# Patient Record
Sex: Female | Born: 1950 | Race: White | Hispanic: No | Marital: Married | Smoking: Former smoker
Health system: Southern US, Community
[De-identification: ages and names within clinical notes are randomized; demographics above are authoritative.]

## PROBLEM LIST (undated history)

## (undated) DIAGNOSIS — M797 Fibromyalgia: Secondary | ICD-10-CM

## (undated) DIAGNOSIS — K6289 Other specified diseases of anus and rectum: Secondary | ICD-10-CM

## (undated) DIAGNOSIS — E785 Hyperlipidemia, unspecified: Secondary | ICD-10-CM

## (undated) DIAGNOSIS — E739 Lactose intolerance, unspecified: Secondary | ICD-10-CM

## (undated) DIAGNOSIS — K900 Celiac disease: Secondary | ICD-10-CM

## (undated) DIAGNOSIS — M4800 Spinal stenosis, site unspecified: Secondary | ICD-10-CM

## (undated) DIAGNOSIS — IMO0002 Reserved for concepts with insufficient information to code with codable children: Secondary | ICD-10-CM

## (undated) DIAGNOSIS — M1990 Unspecified osteoarthritis, unspecified site: Secondary | ICD-10-CM

## (undated) DIAGNOSIS — I10 Essential (primary) hypertension: Secondary | ICD-10-CM

## (undated) DIAGNOSIS — K5190 Ulcerative colitis, unspecified, without complications: Secondary | ICD-10-CM

## (undated) DIAGNOSIS — D6851 Activated protein C resistance: Secondary | ICD-10-CM

## (undated) DIAGNOSIS — K219 Gastro-esophageal reflux disease without esophagitis: Secondary | ICD-10-CM

## (undated) DIAGNOSIS — M419 Scoliosis, unspecified: Secondary | ICD-10-CM

## (undated) DIAGNOSIS — E559 Vitamin D deficiency, unspecified: Secondary | ICD-10-CM

## (undated) DIAGNOSIS — Z981 Arthrodesis status: Secondary | ICD-10-CM

## (undated) DIAGNOSIS — K5792 Diverticulitis of intestine, part unspecified, without perforation or abscess without bleeding: Secondary | ICD-10-CM

## (undated) DIAGNOSIS — M4326 Fusion of spine, lumbar region: Secondary | ICD-10-CM

## (undated) DIAGNOSIS — S060XAA Concussion with loss of consciousness status unknown, initial encounter: Secondary | ICD-10-CM

## (undated) DIAGNOSIS — S060X9A Concussion with loss of consciousness of unspecified duration, initial encounter: Secondary | ICD-10-CM

## (undated) HISTORY — DX: Hyperlipidemia, unspecified: E78.5

## (undated) HISTORY — DX: Scoliosis, unspecified: M41.9

## (undated) HISTORY — DX: Reserved for concepts with insufficient information to code with codable children: IMO0002

## (undated) HISTORY — DX: Vitamin D deficiency, unspecified: E55.9

## (undated) HISTORY — PX: EYE SURGERY: SHX253

## (undated) HISTORY — DX: Spinal stenosis, site unspecified: M48.00

## (undated) HISTORY — PX: LASIK: SHX215

## (undated) HISTORY — PX: BACK SURGERY: SHX140

## (undated) HISTORY — DX: Unspecified osteoarthritis, unspecified site: M19.90

---

## 1976-07-27 HISTORY — PX: FOOT SURGERY: SHX648

## 1981-07-27 HISTORY — PX: TUBAL LIGATION: SHX77

## 1998-01-10 ENCOUNTER — Ambulatory Visit (HOSPITAL_COMMUNITY): Admission: RE | Admit: 1998-01-10 | Discharge: 1998-01-10 | Payer: Self-pay | Admitting: Gastroenterology

## 1998-02-28 ENCOUNTER — Ambulatory Visit (HOSPITAL_COMMUNITY): Admission: RE | Admit: 1998-02-28 | Discharge: 1998-02-28 | Payer: Self-pay | Admitting: Gastroenterology

## 1998-05-06 ENCOUNTER — Ambulatory Visit (HOSPITAL_COMMUNITY): Admission: RE | Admit: 1998-05-06 | Discharge: 1998-05-06 | Payer: Self-pay | Admitting: Internal Medicine

## 1998-06-10 ENCOUNTER — Other Ambulatory Visit: Admission: RE | Admit: 1998-06-10 | Discharge: 1998-06-10 | Payer: Self-pay | Admitting: Gynecology

## 1999-03-08 ENCOUNTER — Encounter: Payer: Self-pay | Admitting: Emergency Medicine

## 1999-03-08 ENCOUNTER — Emergency Department (HOSPITAL_COMMUNITY): Admission: EM | Admit: 1999-03-08 | Discharge: 1999-03-08 | Payer: Self-pay | Admitting: Emergency Medicine

## 1999-05-19 ENCOUNTER — Encounter: Payer: Self-pay | Admitting: Internal Medicine

## 1999-05-19 ENCOUNTER — Ambulatory Visit (HOSPITAL_COMMUNITY): Admission: RE | Admit: 1999-05-19 | Discharge: 1999-05-19 | Payer: Self-pay | Admitting: Internal Medicine

## 1999-06-11 ENCOUNTER — Other Ambulatory Visit: Admission: RE | Admit: 1999-06-11 | Discharge: 1999-06-11 | Payer: Self-pay | Admitting: Gynecology

## 2000-05-20 ENCOUNTER — Encounter: Payer: Self-pay | Admitting: Gynecology

## 2000-05-20 ENCOUNTER — Ambulatory Visit (HOSPITAL_COMMUNITY): Admission: RE | Admit: 2000-05-20 | Discharge: 2000-05-20 | Payer: Self-pay | Admitting: Gynecology

## 2000-06-10 ENCOUNTER — Other Ambulatory Visit: Admission: RE | Admit: 2000-06-10 | Discharge: 2000-06-10 | Payer: Self-pay | Admitting: Gynecology

## 2000-07-27 HISTORY — PX: NECK SURGERY: SHX720

## 2000-10-11 ENCOUNTER — Other Ambulatory Visit: Admission: RE | Admit: 2000-10-11 | Discharge: 2000-10-11 | Payer: Self-pay | Admitting: Gynecology

## 2001-05-24 ENCOUNTER — Ambulatory Visit (HOSPITAL_COMMUNITY): Admission: RE | Admit: 2001-05-24 | Discharge: 2001-05-24 | Payer: Self-pay | Admitting: Gynecology

## 2001-05-24 ENCOUNTER — Encounter: Payer: Self-pay | Admitting: Gynecology

## 2001-06-16 ENCOUNTER — Other Ambulatory Visit: Admission: RE | Admit: 2001-06-16 | Discharge: 2001-06-16 | Payer: Self-pay | Admitting: Gynecology

## 2002-05-26 ENCOUNTER — Encounter: Payer: Self-pay | Admitting: Internal Medicine

## 2002-05-26 ENCOUNTER — Ambulatory Visit (HOSPITAL_COMMUNITY): Admission: RE | Admit: 2002-05-26 | Discharge: 2002-05-26 | Payer: Self-pay | Admitting: Internal Medicine

## 2002-06-20 ENCOUNTER — Other Ambulatory Visit: Admission: RE | Admit: 2002-06-20 | Discharge: 2002-06-20 | Payer: Self-pay | Admitting: Gynecology

## 2003-03-30 ENCOUNTER — Encounter: Payer: Self-pay | Admitting: Gynecology

## 2003-03-30 ENCOUNTER — Ambulatory Visit (HOSPITAL_COMMUNITY): Admission: RE | Admit: 2003-03-30 | Discharge: 2003-03-30 | Payer: Self-pay | Admitting: Gynecology

## 2003-06-13 ENCOUNTER — Ambulatory Visit (HOSPITAL_COMMUNITY): Admission: RE | Admit: 2003-06-13 | Discharge: 2003-06-13 | Payer: Self-pay | Admitting: Gynecology

## 2003-06-26 ENCOUNTER — Other Ambulatory Visit: Admission: RE | Admit: 2003-06-26 | Discharge: 2003-06-26 | Payer: Self-pay | Admitting: Gynecology

## 2004-05-06 ENCOUNTER — Ambulatory Visit: Payer: Self-pay | Admitting: Otolaryngology

## 2004-05-21 ENCOUNTER — Ambulatory Visit: Payer: Self-pay | Admitting: Otolaryngology

## 2004-06-13 ENCOUNTER — Ambulatory Visit (HOSPITAL_COMMUNITY): Admission: RE | Admit: 2004-06-13 | Discharge: 2004-06-13 | Payer: Self-pay | Admitting: Gynecology

## 2004-07-01 ENCOUNTER — Other Ambulatory Visit: Admission: RE | Admit: 2004-07-01 | Discharge: 2004-07-01 | Payer: Self-pay | Admitting: Gynecology

## 2004-10-10 ENCOUNTER — Ambulatory Visit: Payer: Self-pay | Admitting: Internal Medicine

## 2004-11-12 ENCOUNTER — Encounter (INDEPENDENT_AMBULATORY_CARE_PROVIDER_SITE_OTHER): Payer: Self-pay | Admitting: Specialist

## 2004-11-12 ENCOUNTER — Ambulatory Visit (HOSPITAL_COMMUNITY): Admission: RE | Admit: 2004-11-12 | Discharge: 2004-11-12 | Payer: Self-pay | Admitting: Gastroenterology

## 2004-11-28 ENCOUNTER — Encounter: Admission: RE | Admit: 2004-11-28 | Discharge: 2004-11-28 | Payer: Self-pay | Admitting: Gynecology

## 2005-01-15 ENCOUNTER — Ambulatory Visit: Payer: Self-pay | Admitting: Internal Medicine

## 2005-03-04 ENCOUNTER — Ambulatory Visit: Payer: Self-pay | Admitting: Anesthesiology

## 2005-04-09 ENCOUNTER — Ambulatory Visit: Payer: Self-pay | Admitting: Anesthesiology

## 2005-05-21 ENCOUNTER — Ambulatory Visit: Payer: Self-pay | Admitting: Anesthesiology

## 2005-06-15 ENCOUNTER — Ambulatory Visit (HOSPITAL_COMMUNITY): Admission: RE | Admit: 2005-06-15 | Discharge: 2005-06-15 | Payer: Self-pay | Admitting: Internal Medicine

## 2005-06-23 ENCOUNTER — Ambulatory Visit: Payer: Self-pay | Admitting: Anesthesiology

## 2005-07-09 ENCOUNTER — Other Ambulatory Visit: Admission: RE | Admit: 2005-07-09 | Discharge: 2005-07-09 | Payer: Self-pay | Admitting: Gynecology

## 2005-08-20 ENCOUNTER — Ambulatory Visit: Payer: Self-pay | Admitting: Anesthesiology

## 2005-08-24 ENCOUNTER — Ambulatory Visit: Payer: Self-pay | Admitting: Anesthesiology

## 2005-09-09 ENCOUNTER — Ambulatory Visit: Payer: Self-pay | Admitting: Anesthesiology

## 2006-03-03 ENCOUNTER — Ambulatory Visit: Payer: Self-pay

## 2006-04-29 ENCOUNTER — Ambulatory Visit (HOSPITAL_COMMUNITY): Admission: RE | Admit: 2006-04-29 | Discharge: 2006-04-30 | Payer: Self-pay | Admitting: Neurosurgery

## 2007-03-27 ENCOUNTER — Encounter: Admission: RE | Admit: 2007-03-27 | Discharge: 2007-03-27 | Payer: Self-pay | Admitting: Neurosurgery

## 2007-04-21 ENCOUNTER — Inpatient Hospital Stay (HOSPITAL_COMMUNITY): Admission: RE | Admit: 2007-04-21 | Discharge: 2007-04-24 | Payer: Self-pay | Admitting: Neurosurgery

## 2007-04-26 ENCOUNTER — Encounter: Admission: RE | Admit: 2007-04-26 | Discharge: 2007-04-26 | Payer: Self-pay | Admitting: Neurosurgery

## 2007-05-04 ENCOUNTER — Inpatient Hospital Stay (HOSPITAL_COMMUNITY): Admission: EM | Admit: 2007-05-04 | Discharge: 2007-05-11 | Payer: Self-pay | Admitting: Emergency Medicine

## 2007-05-04 ENCOUNTER — Ambulatory Visit: Payer: Self-pay | Admitting: Internal Medicine

## 2007-05-05 ENCOUNTER — Ambulatory Visit: Payer: Self-pay | Admitting: Internal Medicine

## 2007-05-24 ENCOUNTER — Observation Stay (HOSPITAL_COMMUNITY): Admission: EM | Admit: 2007-05-24 | Discharge: 2007-05-25 | Payer: Self-pay | Admitting: *Deleted

## 2007-06-07 ENCOUNTER — Ambulatory Visit: Payer: Self-pay | Admitting: Internal Medicine

## 2007-06-07 DIAGNOSIS — K5190 Ulcerative colitis, unspecified, without complications: Secondary | ICD-10-CM

## 2007-06-07 DIAGNOSIS — Z9889 Other specified postprocedural states: Secondary | ICD-10-CM

## 2007-06-07 DIAGNOSIS — I10 Essential (primary) hypertension: Secondary | ICD-10-CM

## 2007-06-07 DIAGNOSIS — M5136 Other intervertebral disc degeneration, lumbar region: Secondary | ICD-10-CM

## 2007-08-17 ENCOUNTER — Ambulatory Visit: Payer: Self-pay | Admitting: Internal Medicine

## 2007-08-24 ENCOUNTER — Encounter: Payer: Self-pay | Admitting: Internal Medicine

## 2007-09-01 ENCOUNTER — Encounter: Payer: Self-pay | Admitting: Internal Medicine

## 2008-09-01 IMAGING — CR DG LUMBAR SPINE 1V
1 series · 1 of 1 positions shown · non-contrast
Comparison: none

CLINICAL DATA: L2-L3, L3-L4, L4-L5, L5-S1 decompression and fusion

LUMBAR SPINE - 1 VIEW

[view not recorded]
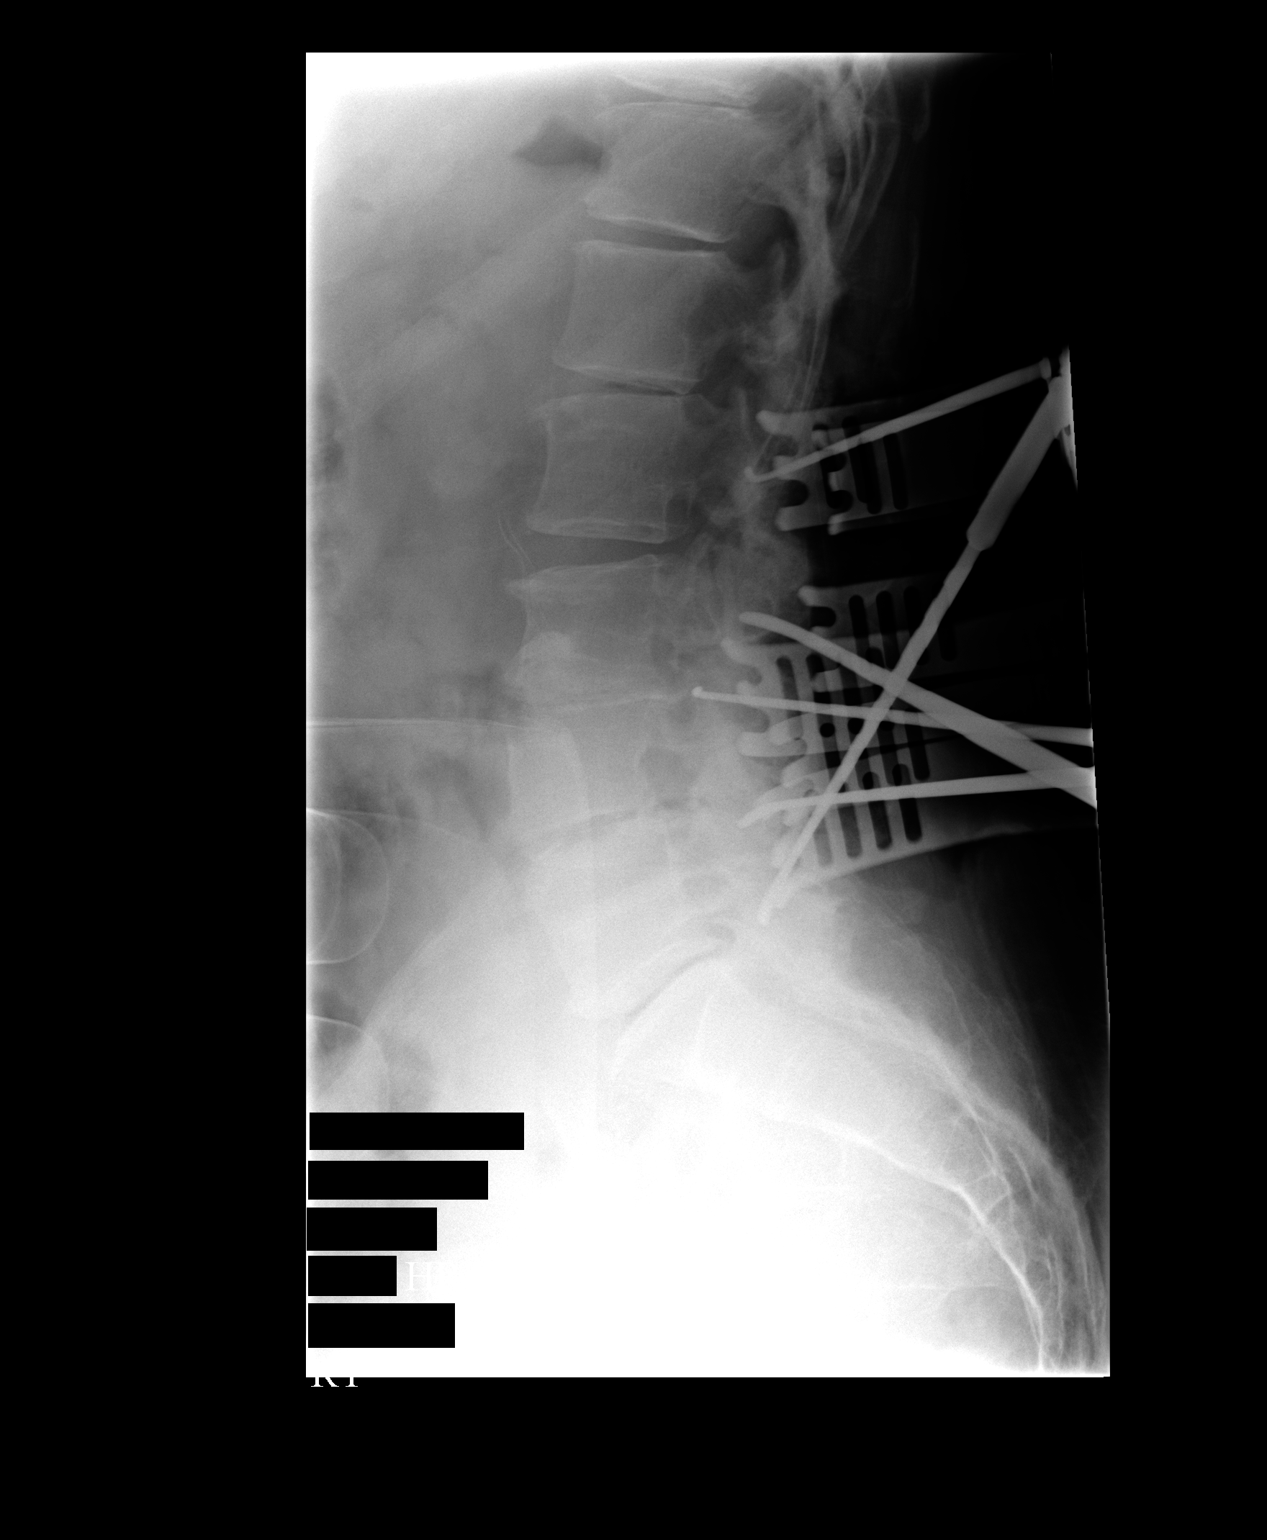

[1 of 1 positions shown; findings below may reference images not displayed]

FINDINGS: Intraoperative film shows multiple posterior surgical instruments in
place, spanning from L2 to L5-S1.

IMPRESSION

Localization as above.

## 2008-09-16 IMAGING — CR DG CHEST 1V PORT
1 series · 1 of 1 positions shown · non-contrast
Comparison: 04/27/06.

CLINICAL DATA: Wound infection.  Respiratory distress.
 PORTABLE CHEST - 1 VIEW:

[view not recorded]
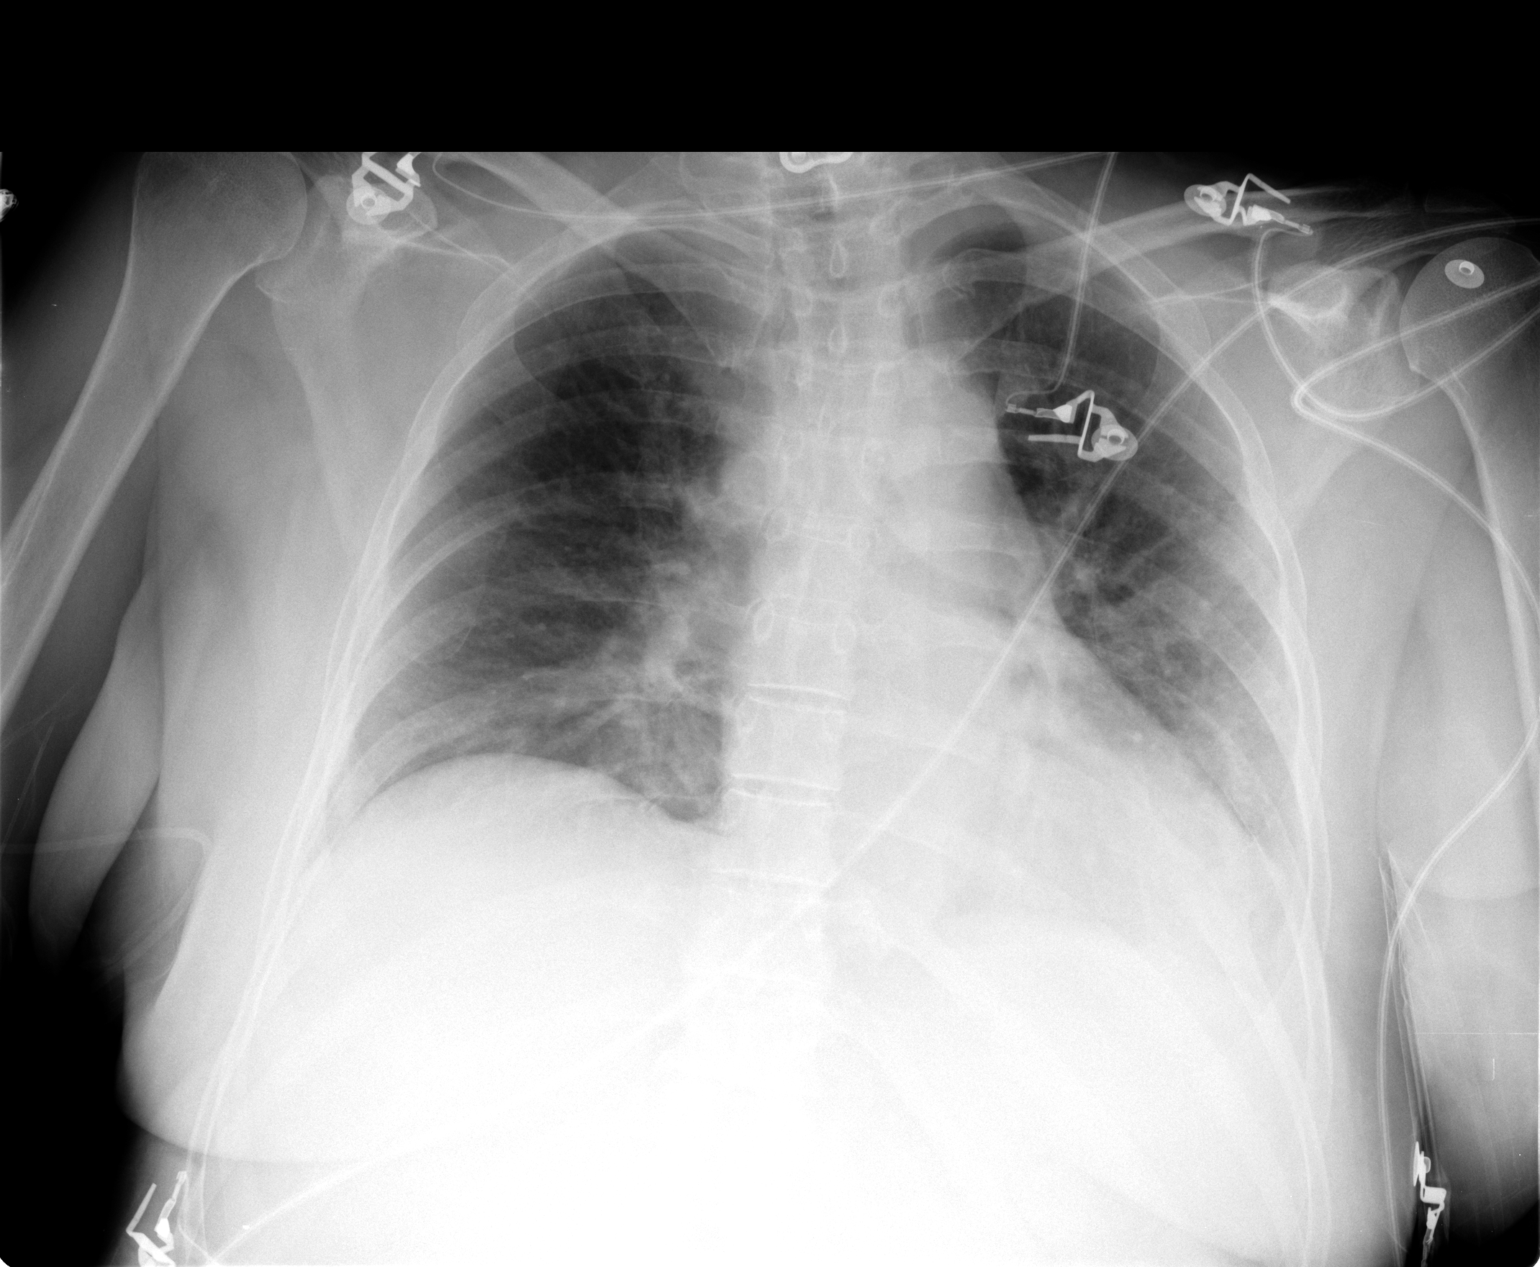

[1 of 1 positions shown; findings below may reference images not displayed]

FINDINGS: The patient has undergone interval cervical fusion.  There is a left pleural effusion and left basilar airspace disease.  Right lung is clear.  Heart size is normal.
IMPRESSION: Small left pleural effusion with left basilar airspace disease which could represent atelectasis.  Pneumonia is not excluded.

## 2010-12-09 NOTE — Discharge Summary (Signed)
NAME:  Carmen Reynolds, Carmen Reynolds                  ACCOUNT NO.:  [id number]   MEDICAL RECORD NO.:  [id number]          PATIENT TYPE:  INP   LOCATION:  3035                         FACILITY:  [facility]   PHYSICIAN:  Leeroy Cha, M.D.   DATE OF BIRTH:  [birth year]   DATE OF ADMISSION:  04/21/2007  DATE OF DISCHARGE:  04/24/2007                               DISCHARGE SUMMARY   ADMISSION DIAGNOSES:  Scoliosis, spondylolisthesis with stenosis from L2-  S1.   FINAL DIAGNOSES:  Scoliosis, spondylolisthesis with stenosis from L2-S1.   CLINICAL HISTORY:  The patient was admitted at Dr. Vertell Limber because of back  pain with radiation to both legs.  X-ray shows stenosis with scoliosis  from L2-S1.  Surgery was advised.  Laboratory normal except that after  surgery the hemoglobin came down to 7.8.  This is acute anemia secondary  to blood loss during surgery.   COURSE IN THE HOSPITAL:  The patient was taken to surgery where a fusion  from L2-S1 was done.  After surgery, the patient complains of dizziness  and low blood pressure.  Repeat CBC showed hemoglobin was 7.8.  Because  of that, we proceeded and transfused 3 units of blood.  Today she is  doing much better.  She feels that she wants to go home.  She was still  having some episodes of constipation and also hemorrhoidal pain.  She  feels that she can go home right now and be followed by Dr. Vertell Limber.   CONDITION ON DISCHARGE:  Improvement.   DISCHARGE MEDICATIONS:  Percocet, diazepam.  She is to continue taking  her previous medications.   DIET:  As preoperatively.   ACTIVITY:  Not to drive, not to do any heavy lifting.   FOLLOWUP:  She is going to call Dr. Vertell Limber for a followup appointment.           ______________________________  Leeroy Cha, M.D.     EB/MEDQ  D:  04/24/2007  T:  04/24/2007  Job:  [id number]

## 2010-12-09 NOTE — Discharge Summary (Signed)
NAMEMarland Reynolds  [name], [name]                  ACCOUNT NO.:  [id number]   MEDICAL RECORD NO.:  [id number]          PATIENT TYPE:  INP   LOCATION:  3027                         FACILITY:  [facility]   PHYSICIAN:  Elizabeth Sauer, M.D.      DATE OF BIRTH:  1950-11-06   DATE OF ADMISSION:  05/04/2007  DATE OF DISCHARGE:  05/11/2007                               DISCHARGE SUMMARY   ADMITTING DIAGNOSIS:  Infected heart effusion.   DISCHARGE DIAGNOSIS:  Infected heart effusion.   OPERATION/PROCEDURE:  1. Incision and drainage of infection.  2. Placement of VAC drain.   HISTORY:  This is a 60 year old lady who had severe  scoliosis/spondylosis, underwent an operation April 21, 2007.  Went  home and did well.  Has had increasing redness and drainage from her  incision.  She was admitted by Dr. Vertell Limber on May 04, 2007 and placed  her on antibiotics.  Over the course of the day after her admission, she  had increasing redness, became hypotensive, and appeared septic.  She  was taken urgently to the OR on May 05, 2007 where she underwent I  and D by Dr. Vertell Limber.  Postoperatively, she was in the ACU looked after by  the critical care team for just a bit.  She rapidly improved once her  infection was taken care of.  Staph consistent with strep.  She  continued on vancomycin.  As she has continued to improve, she now  remains on vancomycin and rifampin.  A PICC line was placed, VAC was  placed, and she is ready to head home.  She is on her IV antibiotics  which were set up for home.  She has the [facility] in place for which [facility]  is going to continue to work.  Her follow up will be via a phone call to  the office to see when Dr. Vertell Limber wants to visit with her.           ______________________________  Elizabeth Sauer, M.D.     MWR/MEDQ  D:  05/11/2007  T:  05/11/2007  Job:  [id number]

## 2010-12-09 NOTE — Op Note (Signed)
[name], [name]                  ACCOUNT NO.:  [id number]   MEDICAL RECORD NO.:  [id number]          PATIENT TYPE:  INP   LOCATION:  2116                         FACILITY:  [facility]   PHYSICIAN:  Marchia Meiers. Vertell Limber, M.D.  DATE OF BIRTH:  1951-03-18   DATE OF PROCEDURE:  05/05/2007  DATE OF DISCHARGE:                               OPERATIVE REPORT   PREOPERATIVE DIAGNOSIS:  Wound infection.   POSTOPERATIVE DIAGNOSIS:  Wound infection.   PROCEDURE:  I and D of lumbar wound.   SURGEON:  Marchia Meiers. Vertell Limber, M.D.   ANESTHESIA:  General endotracheal anesthesia.   ESTIMATED BLOOD LOSS:  Minimal.   COMPLICATIONS:  None.   HISTORY:  Carmen Reynolds is a 60 year old woman who had recently undergone  lumbar fusion for scoliosis and spondylolisthesis.  In the postoperative  period she came back to the office yesterday with redness and drainage  from her lumbar wound and she was admitted to the hospital and started  on IV antibiotics.  She became hypotensive and was felt to be becoming  septic and therefore was taken urgently to the operating room for I and  D of her lumbar wound.   PROCEDURE:  Ms. Leising was brought to the operating room.  She was placed  in a prone position on the chest rolls after smooth and satisfactory  uncomplicated induction of general endotracheal anesthesia.  Her back  was prepped and draped in the usual sterile fashion.  Previous incision  was reopened.  Purulent material was expressed and this extended down  below the fascia to the implanted hardware.  The pulse vac and suction  irrigation was performed with 3 liters of saline and about 2 liters of  bacitracin.  Bone graft material was removed and the some fat necrosis  was also debrided.  The wound otherwise appeared to be reasonably  healthy.  The upper half of it was opened.  The lower half of the skin  was not opened.  Subsequently a Wound VAC sponge was installed and  hooked to suction.  The patient was taken to  recovery having tolerated  procedure well.      Marchia Meiers. Vertell Limber, M.D.  Electronically Signed     JDS/MEDQ  D:  05/05/2007  T:  05/05/2007  Job:  [id number]

## 2010-12-09 NOTE — H&P (Signed)
NAME:  Carmen Reynolds, Carmen Reynolds NO.:  [id number]   MEDICAL RECORD NO.:  [id number]          PATIENT TYPE:  INP   LOCATION:  3019                         FACILITY:  [facility]   PHYSICIAN:  Elizabeth Sauer, M.D.      DATE OF BIRTH:  1951/04/12   DATE OF ADMISSION:  05/24/2007  DATE OF DISCHARGE:                              HISTORY & PHYSICAL   ADMITTING DIAGNOSIS:  Hypotension.   SERVICE:  Neurosurgery.   This is a 60 year old right-handed white female who had a lumbar fusion,  subsequent infection and debridement.  Was sent home with a vac, on  cefazolin and rifampin via PICC.  According to her nurses aid, she had  diminished blood pressure at home.  In the emergency room, her blood  pressure is 95/60.   SHE IS ALLERGIC TO LYRICA.   She has a history of steroid use.   Her meds are rifampin, cefazolin, Cymbalta, Nexium.   EXAMINATION:  GENERAL:  She is awake, alert, slightly pale without  diaphoresis.  HEENT EXAM:  Within normal limits.  Mucous membranes are dry.  NECK:  Supple.  No meningismus.  CHEST:  Clear.  CARDIAC EXAM:  Regular rate and rhythm.  ABDOMEN:  Soft.  Positive bowel sounds.  BACK:  The vac on her back is without erythema.  EXTREMITIES:  Without clubbing or cyanosis.  NEUROLOGICALLY:  She is awake, alert, and oriented x3.  Her cranial  nerves are intact.  Her strength is full throughout.   Chest x-ray is clear.   This is likely dehydration versus hypoadrenalism.  Going to admit, check  her electrolytes, CBC, and hydrate her.  She will remain on her meds in  the hospital.           ______________________________  Elizabeth Sauer, M.D.     MWR/MEDQ  D:  05/24/2007  T:  05/25/2007  Job:  [id number]

## 2010-12-09 NOTE — Op Note (Signed)
NAMEMarland Reynolds  Carmen Reynolds, FURR                  ACCOUNT NO.:  [id number]   MEDICAL RECORD NO.:  [id number]          PATIENT TYPE:  INP   LOCATION:  3172                         FACILITY:  [facility]   PHYSICIAN:  Marchia Meiers. Vertell Limber, M.D.  DATE OF BIRTH:  05/20/1951   DATE OF PROCEDURE:  04/21/2007  DATE OF DISCHARGE:                               OPERATIVE REPORT   PREOPERATIVE DIAGNOSIS:  Scoliosis, spondylolisthesis, stenosis,  spondylosis, herniated lumbar disk and radiculopathy L2-S1 levels.   POSTOPERATIVE DIAGNOSIS:  Scoliosis, spondylolisthesis, stenosis,  spondylosis, herniated lumbar disk and radiculopathy L2-S1 levels.   PROCEDURE:  1. L2-S1 decompression with laminectomy and facetectomy.  2. Pedicle screw fixation L2-S1 bilaterally (segmental      instrumentation).  3. Posterolateral arthrodesis L2-S1 with local bone autograft and the      bone morphogenic protein.   SURGEON:  Marchia Meiers. Vertell Limber, M.D.   ASSISTANT:  Earleen Newport, M.D. and Poteat RN   ANESTHESIA:  General endotracheal anesthesia.   ESTIMATED BLOOD LOSS:  681 mL   COMPLICATIONS:  None.   DISPOSITION:  Recovery.   INDICATIONS:  Carmen Reynolds is a 60 year old woman with severe right leg  pain and weakness.  She has marked stenosis and nerve root compression  with spondylosis and severe scoliosis with spondylolisthesis at  particularly the L3-4, 4-5, and 5-1 levels.  It was elected take her to  surgery for decompression and fusion.   PROCEDURE:  Ms. Carmen Reynolds was brought to the operating room.  Following  satisfactory uncomplicated induction of general endotracheal anesthesia  and placement of intravenous lines and Foley catheter, she was placed in  a prone position on the [place] table.  Her low back was then prepped  and draped in the usual sterile fashion.  Prior to prepping the soft  tissue, bony prominences were padded appropriately.  Her low back  incision was made after infiltrating skin, subcutaneous tissues with  lidocaine with epinephrine and this incision was carried from the L2 to  sacral levels bilaterally using electrocautery and Cobb elevators to  reveal the L2, L3, L4, L5 transverse processes and sacral ala  bilaterally.  Subsequently Versatrac retractors placed to facilitate  exposure.  After confirmatory x-ray with marker probes at each of the  transverse disease of these levels, a total laminectomy of L3, L4 and  the L5 was performed and then using high-speed drill and under loupe  magnification, markedly hypertrophied bone and facet joints were thinned  and the laminectomy was completed with Kerrison rongeurs.  There was  dense adhesions of the ligamentous tissue to the dura and using  painstaking dissection, the dura was decompressed especially including  the L5 nerve root on the right which was widely decompressed as it  extended out the neural foramen.  Because of the severity the dural  adhesions it was felt that it would not be possible to place interbody  grafts at the area without significant risk to neural structures.  It  was also felt that after the midline and bilateral decompression was  performed including facetectomy at L4-5 that the neural elements  were  sufficiently decompressed.  It was therefore elected to place fixation  at this point rather than interbody grafts and the pedicle screw  fixation was placed using 5.5-mm screws at L2-L5 levels and 6.5 mm  screws had sacrum.  The positioning of the screws was confirmed on AP  and lateral fluoroscopy.  There was no evidence any cutouts along the  course the pedicle screws and all pedicle screws appeared to have  excellent purchase.  Prior to placing pedicle screw fixation, the  posterolateral region was decorticated with high-speed drill in  preparation for bone grafting and subsequently, a large BMP kit was used  along with bone autograft this was placed in the posterolateral region  bilaterally from L2 through sacral  levels.  Subsequently 90 mm rod was  placed on the left and a 110-mm rod was placed on the right due to the  scoliotic curvature.  The rods were then locked down in situ.  The cross-  link was placed at approximately L3 level and locked down appropriately.  Prior to placing bone graft, the wound was then copiously irrigated with  bacitracin saline.  Subsequently the self-retaining tractor was removed.  The deep muscular tissues were approximated 1 Vicryl sutures.  The  fascia was reapproximated 1 Vicryl sutures.  Skin edges reapproximated 2-  0, 3-0 Vicryl subcuticular stitch.  Wound was dressed with Benzoin,  Steri-Strips, gauze and tape.  The patient was taken to recovery having  tolerated procedure well.      Marchia Meiers. Vertell Limber, M.D.  Electronically Signed     JDS/MEDQ  D:  04/21/2007  T:  04/22/2007  Job:  [id number]

## 2010-12-09 NOTE — H&P (Signed)
Carmen Reynolds, Carmen Reynolds                  ACCOUNT NO.:  [id number]   MEDICAL RECORD NO.:  [id number]          PATIENT TYPE:  INP   LOCATION:  2116                         FACILITY:  [facility]   PHYSICIAN:  Marchia Meiers. Vertell Limber, M.D.  DATE OF BIRTH:  December 27, 1950   DATE OF ADMISSION:  05/04/2007  DATE OF DISCHARGE:                              HISTORY & PHYSICAL   REASON FOR ADMISSION:  Miss Schreiner is a 60 year old woman with severe  lumbar scoliosis and spondylolisthesis who was admitted to the hospital  on April 21, 2007, and discharged on April 24, 2007, after  undergoing a decompressive surgery with fusion operation.  The patient  came into the office today complaining of soreness in her back with  redness and drainage from her incision.  I was concerned that she may  have developed a wound infection and she denies fever or chills but  admitted to the hospital, also with IV antibiotics and observation.  She  denies any leg pain at the present time.  She was recently on prednisone  taper for significant right leg pain but this has subsequently resolved.   PAST MEDICAL HISTORY:  Significant for scoliosis and hypertension.   PHYSICAL EXAMINATION:  GENERAL:  She is alert and conversant.  She is  uncomfortable.  Her incision is uncomfortable with a redness around the  incision with some bloody serous drainage.  VITAL SIGNS:  Temperature is 97.6   PLAN:  To admit the patient to 3000, start IV antibiotics with wound  culture and CBC and chemistries and observe the patient.  She may  require I&D of her wound.  Wait for culture results.      Marchia Meiers. Vertell Limber, M.D.  Electronically Signed     JDS/MEDQ  D:  05/04/2007  T:  05/05/2007  Job:  [id number]

## 2010-12-12 NOTE — Op Note (Signed)
NAME:  Carmen Reynolds, Carmen Reynolds                  ACCOUNT NO.:  [id number]   MEDICAL RECORD NO.:  [id number]          PATIENT TYPE:  AMB   LOCATION:  ENDO                         FACILITY:  [facility]   PHYSICIAN:  Nelwyn Salisbury, M.D.  DATE OF BIRTH:  16-Mar-1951   DATE OF PROCEDURE:  11/12/2004  DATE OF DISCHARGE:                                 OPERATIVE REPORT   PROCEDURE PERFORMED:  Colonoscopy with rectal biopsies times four.   ENDOSCOPIST:  Juanita Craver, M.D.   INSTRUMENT USED:  Olympus video colonoscope.   INDICATIONS FOR PROCEDURE:  60 year old white female undergoing colonoscopy  for passage of rectal bleeding and clots.  The patient has a history of  ulcerative colitis diagnosed in 2000. Colonoscopy is being done to evaluate  the extent of disease.   PREPROCEDURE PREPARATION:  Informed consent was procured from the patient.  The patient was fasted for eight hours prior to the procedure and prepped  with a bottle of magnesium citrate and a gallon of GoLYTELY the night prior  to the procedure.  The risks and benefits of the procedure including a 10%  miss rate for cancer or polyps was discussed with the patient as well.   PREPROCEDURE PHYSICAL:  The patient had stable vital signs.  Neck supple.  Chest clear to auscultation.  S1 and S2 regular.  Abdomen soft with normal  bowel sounds.   DESCRIPTION OF PROCEDURE:  The patient was placed in left lateral decubitus  position and sedated with 75 mg of Demerol and 6 mg of Versed in slow  incremental doses.  Once the patient was adequately sedated and maintained  on low flow oxygen and continuous cardiac monitoring, the Olympus video  colonoscope was advanced from the rectum to the cecum.  There was evidence  of severe proctitis from 0 to 5 cm.  Biopsies were done to confirm the  diagnosis.  The rest of the exam was normal.  The terminal ileum appeared  healthy without lesions.  There was a large amount of residual stool in the  colon.  Multiple  washes were done.  Small lesions could be missed.   IMPRESSION:  1.  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  Otherwise unrevealing colonoscopy to      the terminal ileum.  2.  Couple of diverticula seen, one in the left colon, one in the right      colon.  3.  Normal terminal ileum.  4.  Large amount of residual stool in the colon, small lesions could be      missed.   RECOMMENDATIONS:  1.  Canasa suppositories have been advised and will be called in to the      patient's pharmacy at [facility].  2.  Discontinue Asacol for now.  3.  Outpatient follow-up in the next four weeks for further recommendations.      JNM/MEDQ  D:  11/12/2004  T:  11/12/2004  Job:  [id number]   cc:   Unk Pinto, M.D.  [address], [place]  [place], [place] [address]  Fax: [phone]

## 2010-12-12 NOTE — Op Note (Signed)
NAMEMarland Kitchen  GERALINE, HALBERSTADT                  ACCOUNT NO.:  [id number]   MEDICAL RECORD NO.:  [id number]          PATIENT TYPE:  OIB   LOCATION:  3172                         FACILITY:  [facility]   PHYSICIAN:  Marchia Meiers. Vertell Limber, M.D.  DATE OF BIRTH:  06/29/1951   DATE OF PROCEDURE:  04/29/2006  DATE OF DISCHARGE:                                 OPERATIVE REPORT   PREOPERATIVE DIAGNOSES:  Herniated cervical disk with scoliosis,  degenerative disk disease, spondylosis and radiculopathy, C5-6 and C6-7  levels.   POSTOPERATIVE DIAGNOSES:  Herniated cervical disk with scoliosis,  degenerative disk disease, spondylosis and radiculopathy, C5-6 and C6-7  levels.   PROCEDURE:  Anterior cervical decompression and fusion, C5-6 and C6-7  levels, with morcellized bone autograft and anterior cervical plating.   SURGEON:  Marchia Meiers. Vertell Limber, M.D.   ASSISTANTS:  Elizabeth Sauer, M.D.; Poteat, RN.   ANESTHESIA:  General endotracheal anesthetic.   ESTIMATED BLOOD LOSS:  Minimal.   COMPLICATIONS:  None.   DISPOSITION:  To recovery.   INDICATIONS:  The patient is a 60 year old woman with scoliosis, herniated  cervical disk, cervical spondylosis and degenerative disk disease with  significant foraminal stenosis at the C5-6 and C6-7 levels, with  radiculopathy, right greater than left.  It was elected to take her to  surgery for anterior cervical decompression and fusion at these affected  levels.   DESCRIPTION OF PROCEDURE:  Mrs. Bassinger was brought to the operating room.  Following the satisfactory and uncomplicated induction of general  endotracheal anesthesia and placement of intravenous lines, the patient was  placed in supine position on the operating table.  The neck was placed in  slight extension and she was placed in 10 pounds of halter traction.  Her  anterior neck was then prepped and draped in the usual sterile fashion.  The  area of the incision was infiltrated with 0.25% Marcaine and 0.5% lidocaine  with 1:200,000 epinephrine.  Incision was made from the midline to the  anterior border of the sternocleidomastoid on the left side of the midline.  Subplatysmal dissection was performed, exposing the anterior border of the  sternocleidomastoid using blunt dissection.  The carotid sheath was kept  lateral and the trachea and esophagus kept medial, exposing the anterior  cervical spine.  A bent spinal needle was placed at what was felt to be the  C5-6 level and this was confirmed on intraoperative x-ray.  Subsequently,  the longus colli muscles were taken down from the anterior cervical spine  from the C5 through C7 levels bilaterally using electrocautery and a key  elevator.  A Leksell rongeur was used to remove the large ventral  osteophytes.  A Shadowline retractor was placed to facilitate exposure,  along with an up-and-down retractor.  The interspaces at C5-6 and C6-7 were  incised with a 15 blade and disk material was removed in a piecemeal fashion  initially at the C6-7 level.  The high-speed drill was used to decorticate  the endplate and remove uncinate spurs, particularly on the right side of  the midline, but also done bilaterally.  And subsequently at the C5-6, a  similar decompression was performed.  The microscope was brought into the  field, and using high-power microscopic visualization, the posterior  longitudinal ligament was removed with resulting significant decompression  of both the spinal cord dura and both C7 nerve roots as they exited out  their neural foramina.  Hemostasis was assured with Gelfoam soaked in  thrombin.  After trial sizing, a 7-mm PEEK interbody cage was selected,  packed with morcellized bone autograft, inserted in the interspace and  countersunk appropriately.  Attention was then turned to the C5-6 level,  where a similar decompression was performed, and at this level, a similar  size interbody graft was placed with morcellized bone autograft as  well.  After a 34-mm anterior cervical plate, an Alphatec Reveal angina cervical  plate, was then affixed to the anterior cervical spine using variable-angle  14-mm screws, 2 at C5, 2 at C6 and 2 at C7, all screws had excellent  purchase.  Traction weight was released prior to placing the plate and a  final x-ray was obtained, which demonstrated a well-positioned interbody  graft and anterior cervical plate.  The inferior-most aspect of the  construct was not well visualized because of the patient's body habitus.  Hemostasis was assured.  Soft tissues were inspected and found to be in good  repair.  The platysma layer was closed with 3-0 Vicryl suture.  Subcutaneous  tissues were reapproximated with 3-0 Vicryl interrupted, inverted sutures,  and the wound was dressed with Dermabond.  The patient was extubated in the  operating room and taken to the recovery room in stable and satisfactory  condition, having tolerated the operation well.  Counts were correct at the  end of the case.      Marchia Meiers. Vertell Limber, M.D.  Electronically Signed     JDS/MEDQ  D:  04/29/2006  T:  04/29/2006  Job:  [id number]   cc:   Elizabeth Sauer, M.D.

## 2010-12-12 NOTE — Discharge Summary (Signed)
Carmen Reynolds, Carmen Reynolds                  ACCOUNT NO.:  [id number]   MEDICAL RECORD NO.:  [id number]          PATIENT TYPE:  INP   LOCATION:  3019                         FACILITY:  [facility]   PHYSICIAN:  Marchia Meiers. Vertell Limber, M.D.  DATE OF BIRTH:  [birth year]   DATE OF ADMISSION:  05/24/2007  DATE OF DISCHARGE:  05/25/2007                               DISCHARGE SUMMARY   REASON FOR ADMISSION:  Hypotension and confusion.   HISTORY OF PRESENT ILLNESS:  The patient is a 60 year old woman who  underwent lumbar decompression and fusion L2-L5 levels, who on April 21, 2007 developed a wound infection and was re-admitted to the hospital  on the 8th of October and underwent back dressing changes. She was  brought into the hospital on the 28th with episode of hypotension and  was felt to either have dehydration versus hypoadrenalism. The patient  was given intravenous fluids. Was felt to be dehydrated. Was doing  better after that and was discharged home with continued home care for  wound infection on the 29th.      Marchia Meiers. Vertell Limber, M.D.  Electronically Signed     JDS/MEDQ  D:  07/29/2007  T:  07/29/2007  Job:  [id number]

## 2011-03-13 ENCOUNTER — Other Ambulatory Visit (HOSPITAL_COMMUNITY): Payer: Self-pay | Admitting: Neurosurgery

## 2011-03-13 DIAGNOSIS — M546 Pain in thoracic spine: Secondary | ICD-10-CM

## 2011-03-13 DIAGNOSIS — M545 Low back pain: Secondary | ICD-10-CM

## 2011-03-26 ENCOUNTER — Other Ambulatory Visit (HOSPITAL_COMMUNITY): Payer: Self-pay | Admitting: Neurosurgery

## 2011-03-26 ENCOUNTER — Ambulatory Visit (HOSPITAL_COMMUNITY)
Admission: RE | Admit: 2011-03-26 | Discharge: 2011-03-26 | Disposition: A | Discharge status: Home / Self Care | Payer: 59 | Source: Ambulatory Visit | Attending: Neurosurgery | Admitting: Neurosurgery

## 2011-03-26 ENCOUNTER — Encounter (HOSPITAL_COMMUNITY): Payer: Self-pay

## 2011-03-26 DIAGNOSIS — M4804 Spinal stenosis, thoracic region: Secondary | ICD-10-CM | POA: Insufficient documentation

## 2011-03-26 DIAGNOSIS — M549 Dorsalgia, unspecified: Secondary | ICD-10-CM

## 2011-03-26 DIAGNOSIS — M545 Low back pain: Secondary | ICD-10-CM

## 2011-03-26 DIAGNOSIS — M48061 Spinal stenosis, lumbar region without neurogenic claudication: Secondary | ICD-10-CM | POA: Insufficient documentation

## 2011-03-26 DIAGNOSIS — M4120 Other idiopathic scoliosis, site unspecified: Secondary | ICD-10-CM | POA: Insufficient documentation

## 2011-03-26 DIAGNOSIS — IMO0002 Reserved for concepts with insufficient information to code with codable children: Secondary | ICD-10-CM | POA: Insufficient documentation

## 2011-03-26 DIAGNOSIS — M546 Pain in thoracic spine: Secondary | ICD-10-CM

## 2011-03-26 DIAGNOSIS — M519 Unspecified thoracic, thoracolumbar and lumbosacral intervertebral disc disorder: Secondary | ICD-10-CM | POA: Insufficient documentation

## 2011-03-26 HISTORY — DX: Fusion of spine, lumbar region: M43.26

## 2011-03-26 HISTORY — DX: Arthrodesis status: Z98.1

## 2011-03-26 MED ORDER — IOHEXOL 300 MG/ML  SOLN
10.0000 mL | Freq: Once | INTRAMUSCULAR | Status: AC | PRN
  Administered 2011-03-26: 10 mL via INTRATHECAL

## 2011-05-06 LAB — DIFFERENTIAL
Eosinophils Relative: 0
Lymphocytes Relative: 24
Lymphs Abs: 0.8
Monocytes Absolute: 0.5
Neutro Abs: 1.9

## 2011-05-06 LAB — URINE CULTURE
Colony Count: NO GROWTH
Culture: NO GROWTH

## 2011-05-06 LAB — POCT I-STAT CREATININE: Operator id: 270651

## 2011-05-06 LAB — CULTURE, BLOOD (ROUTINE X 2): Culture: NO GROWTH

## 2011-05-06 LAB — URINE MICROSCOPIC-ADD ON

## 2011-05-06 LAB — URINALYSIS, ROUTINE W REFLEX MICROSCOPIC
Bilirubin Urine: NEGATIVE
Glucose, UA: NEGATIVE
Hgb urine dipstick: NEGATIVE
Ketones, ur: 15 — AB
Nitrite: NEGATIVE
Protein, ur: NEGATIVE
Specific Gravity, Urine: 1.027
Urobilinogen, UA: 0.2
pH: 6

## 2011-05-06 LAB — I-STAT 8, (EC8 V) (CONVERTED LAB)
BUN: 21
Glucose, Bld: 110 — ABNORMAL HIGH
HCT: 36
Hemoglobin: 12.2
Operator id: 270651
Potassium: 3.9
Sodium: 131 — ABNORMAL LOW
TCO2: 27

## 2011-05-06 LAB — CBC
HCT: 33.7 — ABNORMAL LOW
Hemoglobin: 11.5 — ABNORMAL LOW
RBC: 3.76 — ABNORMAL LOW
WBC: 3.2 — ABNORMAL LOW

## 2011-05-07 LAB — WOUND CULTURE: Gram Stain: NONE SEEN

## 2011-05-07 LAB — CBC
HCT: 37.1
HCT: 22.5 — ABNORMAL LOW
HCT: 34.9 — ABNORMAL LOW
HCT: 37
Hemoglobin: 10.3 — ABNORMAL LOW
Hemoglobin: 12.5
Hemoglobin: 9.7 — ABNORMAL LOW
Hemoglobin: 12.6
Hemoglobin: 7.5 — CL
MCHC: 33.8
MCHC: 34.1
MCHC: 33.3
MCHC: 34.1
MCV: 91.9
MCV: 89
MCV: 91.1
MCV: 91.8
Platelets: 511 — ABNORMAL HIGH
Platelets: 233
RBC: 3.14 — ABNORMAL LOW
RBC: 4.11
RBC: 2.45 — ABNORMAL LOW
RBC: 3.92
RDW: 15.2 — ABNORMAL HIGH
RDW: 15.2 — ABNORMAL HIGH
RDW: 14.1 — ABNORMAL HIGH
WBC: 28.2 — ABNORMAL HIGH
WBC: 41.8 — ABNORMAL HIGH
WBC: 12.8 — ABNORMAL HIGH

## 2011-05-07 LAB — CULTURE, BLOOD (ROUTINE X 2)
Culture: NO GROWTH
Culture: NO GROWTH

## 2011-05-07 LAB — CLOSTRIDIUM DIFFICILE EIA: C difficile Toxins A+B, EIA: NEGATIVE

## 2011-05-07 LAB — BASIC METABOLIC PANEL
BUN: 28 — ABNORMAL HIGH
CO2: 28
CO2: 29
CO2: 29
Calcium: 8.4
Calcium: 8.8
Calcium: 9.3
Chloride: 107
GFR calc Af Amer: 31 — ABNORMAL LOW
GFR calc Af Amer: >60
GFR calc non Af Amer: 20 — ABNORMAL LOW
GFR calc non Af Amer: 26 — ABNORMAL LOW
Glucose, Bld: 102 — ABNORMAL HIGH
Glucose, Bld: 113 — ABNORMAL HIGH
Glucose, Bld: 114 — ABNORMAL HIGH
Potassium: 4.4
Potassium: 3.5
Sodium: 134 — ABNORMAL LOW
Sodium: 134 — ABNORMAL LOW
Sodium: 143
Sodium: 139

## 2011-05-07 LAB — DIFFERENTIAL
Basophils Absolute: 0
Basophils Absolute: 0.4 — ABNORMAL HIGH
Basophils Relative: 0
Eosinophils Absolute: 0
Eosinophils Absolute: 0
Eosinophils Absolute: 0
Eosinophils Absolute: 0.1
Eosinophils Relative: 0
Eosinophils Relative: 0
Lymphocytes Relative: 4 — ABNORMAL LOW
Lymphocytes Relative: 4 — ABNORMAL LOW
Lymphocytes Relative: 4 — ABNORMAL LOW
Lymphs Abs: 1.5
Lymphs Abs: 1.3
Monocytes Relative: 4
Monocytes Relative: 5
Monocytes Relative: 16 — ABNORMAL HIGH
Neutro Abs: 26 — ABNORMAL HIGH
Neutro Abs: 32.6 — ABNORMAL HIGH
Neutro Abs: 38 — ABNORMAL HIGH
Neutrophils Relative %: 91 — ABNORMAL HIGH

## 2011-05-07 LAB — COMPREHENSIVE METABOLIC PANEL
Albumin: 3 — ABNORMAL LOW
BUN: 8
CO2: 26
Calcium: 9
Chloride: 112
Creatinine, Ser: 0.76
GFR calc non Af Amer: >60
Total Bilirubin: 0.8

## 2011-05-07 LAB — ANAEROBIC CULTURE

## 2011-05-07 LAB — CROSSMATCH: Antibody Screen: NEGATIVE

## 2011-05-07 LAB — GRAM STAIN

## 2011-05-07 LAB — POCT I-STAT 7, (LYTES, BLD GAS, ICA,H+H)
Bicarbonate: 24
O2 Saturation: 100
Sodium: 134 — ABNORMAL LOW
pCO2 arterial: 38.1
pO2, Arterial: 374 — ABNORMAL HIGH

## 2011-05-07 LAB — PHOSPHORUS: Phosphorus: 2.4

## 2011-05-07 LAB — SEDIMENTATION RATE: Sed Rate: 9

## 2012-07-06 ENCOUNTER — Other Ambulatory Visit: Payer: Self-pay | Admitting: Neurosurgery

## 2012-07-15 ENCOUNTER — Encounter (HOSPITAL_COMMUNITY): Payer: Self-pay | Admitting: Vascular Surgery

## 2012-08-08 ENCOUNTER — Encounter (HOSPITAL_COMMUNITY): Admission: RE | Admit: 2012-08-08 | Payer: 59 | Source: Ambulatory Visit

## 2012-08-12 ENCOUNTER — Inpatient Hospital Stay (HOSPITAL_COMMUNITY): Admission: RE | Admit: 2012-08-12 | Payer: BC Managed Care – PPO | Source: Ambulatory Visit | Admitting: Neurosurgery

## 2012-08-12 ENCOUNTER — Encounter (HOSPITAL_COMMUNITY): Admission: RE | Payer: Self-pay | Source: Ambulatory Visit

## 2012-08-12 SURGERY — ANTERIOR LATERAL LUMBAR FUSION 3 LEVELS
Anesthesia: General

## 2012-08-16 ENCOUNTER — Other Ambulatory Visit: Payer: Self-pay | Admitting: Physician Assistant

## 2012-08-16 ENCOUNTER — Ambulatory Visit
Admission: RE | Admit: 2012-08-16 | Discharge: 2012-08-16 | Disposition: A | Discharge status: Home / Self Care | Payer: BC Managed Care – PPO | Source: Ambulatory Visit | Attending: Physician Assistant | Admitting: Physician Assistant

## 2012-08-16 DIAGNOSIS — S0990XA Unspecified injury of head, initial encounter: Secondary | ICD-10-CM

## 2012-08-18 ENCOUNTER — Ambulatory Visit (HOSPITAL_COMMUNITY)
Admission: RE | Admit: 2012-08-18 | Discharge: 2012-08-18 | Disposition: A | Discharge status: Home / Self Care | Payer: BC Managed Care – PPO | Source: Ambulatory Visit | Attending: Physician Assistant | Admitting: Physician Assistant

## 2012-08-18 ENCOUNTER — Other Ambulatory Visit (HOSPITAL_COMMUNITY): Payer: Self-pay | Admitting: Physician Assistant

## 2012-08-18 DIAGNOSIS — S838X9A Sprain of other specified parts of unspecified knee, initial encounter: Secondary | ICD-10-CM

## 2012-08-18 DIAGNOSIS — M25469 Effusion, unspecified knee: Secondary | ICD-10-CM | POA: Insufficient documentation

## 2012-08-18 DIAGNOSIS — M25569 Pain in unspecified knee: Secondary | ICD-10-CM | POA: Insufficient documentation

## 2012-08-18 DIAGNOSIS — M25579 Pain in unspecified ankle and joints of unspecified foot: Secondary | ICD-10-CM | POA: Insufficient documentation

## 2012-08-29 ENCOUNTER — Other Ambulatory Visit: Payer: Self-pay | Admitting: Neurosurgery

## 2012-09-14 ENCOUNTER — Encounter (HOSPITAL_COMMUNITY): Payer: Self-pay

## 2012-09-14 ENCOUNTER — Encounter (HOSPITAL_COMMUNITY)
Admission: RE | Admit: 2012-09-14 | Discharge: 2012-09-14 | Disposition: A | Discharge status: Home / Self Care | Payer: BC Managed Care – PPO | Source: Ambulatory Visit | Attending: Neurosurgery | Admitting: Neurosurgery

## 2012-09-14 ENCOUNTER — Ambulatory Visit (HOSPITAL_COMMUNITY)
Admission: RE | Admit: 2012-09-14 | Discharge: 2012-09-14 | Disposition: A | Discharge status: Home / Self Care | Payer: BC Managed Care – PPO | Source: Ambulatory Visit | Attending: Anesthesiology | Admitting: Anesthesiology

## 2012-09-14 DIAGNOSIS — Z01812 Encounter for preprocedural laboratory examination: Secondary | ICD-10-CM | POA: Insufficient documentation

## 2012-09-14 DIAGNOSIS — Z01818 Encounter for other preprocedural examination: Secondary | ICD-10-CM | POA: Insufficient documentation

## 2012-09-14 DIAGNOSIS — M47814 Spondylosis without myelopathy or radiculopathy, thoracic region: Secondary | ICD-10-CM | POA: Insufficient documentation

## 2012-09-14 DIAGNOSIS — I10 Essential (primary) hypertension: Secondary | ICD-10-CM | POA: Insufficient documentation

## 2012-09-14 HISTORY — DX: Lactose intolerance, unspecified: E73.9

## 2012-09-14 HISTORY — DX: Activated protein C resistance: D68.51

## 2012-09-14 HISTORY — DX: Diverticulitis of intestine, part unspecified, without perforation or abscess without bleeding: K57.92

## 2012-09-14 HISTORY — DX: Gastro-esophageal reflux disease without esophagitis: K21.9

## 2012-09-14 HISTORY — DX: Celiac disease: K90.0

## 2012-09-14 HISTORY — DX: Concussion with loss of consciousness of unspecified duration, initial encounter: S06.0X9A

## 2012-09-14 HISTORY — DX: Ulcerative colitis, unspecified, without complications: K51.90

## 2012-09-14 HISTORY — DX: Concussion with loss of consciousness status unknown, initial encounter: S06.0XAA

## 2012-09-14 HISTORY — DX: Other specified diseases of anus and rectum: K62.89

## 2012-09-14 HISTORY — DX: Essential (primary) hypertension: I10

## 2012-09-14 HISTORY — DX: Fibromyalgia: M79.7

## 2012-09-14 LAB — SURGICAL PCR SCREEN: MRSA, PCR: NEGATIVE

## 2012-09-14 LAB — TYPE AND SCREEN
ABO/RH(D): A POS
Antibody Screen: NEGATIVE

## 2012-09-14 NOTE — Pre-Procedure Instructions (Signed)
Carmen Reynolds  09/14/2012   Your procedure is scheduled on:  Thursday, February 27th.  Report to [place] at 5:30AM.  Call this number if you have problems the morning of surgery: [phone]   Remember:   Do not eat food or drink liquids after midnight.    Take these medicines the morning of surgery with A SIP OF WATER: Nexium, Verapamil SR.  Hydrocodone- Acetaminophen if needed.  Stop taking Aspirin, Coumadin, Plavix, Effient and Herbal medications.  Do not take any NSAIDs ie: Ibuprofen,  Advil,Naproxen or any medication containing Aspirin.   Do not wear jewelry, make-up or nail polish.  Do not wear lotions, powders, or perfumes.   Do not shave 48 hours prior to surgery. Men may shave face and neck.  Do not bring valuables to the hospital.  Contacts, dentures or bridgework may not be worn into surgery.  Leave suitcase in the car. After surgery it may be brought to your room.  For patients admitted to the hospital, checkout time is 11:00 AM the day of  discharge.    Special Instructions: Shower using CHG 2 nights before surgery and the night before surgery.  If you shower the day of surgery use CHG.  Use special wash - you have one bottle of CHG for all showers.  You should use approximately 1/3 of the bottle for each shower.   Please read over the following fact sheets that you were given: Pain Booklet, Coughing and Deep Breathing, Blood Transfusion Information and Surgical Site Infection Prevention

## 2012-09-14 NOTE — Pre-Procedure Instructions (Signed)
ILIA DIMAANO  09/14/2012   Your procedure is scheduled on:  Thursday, February 27th.  Report to [place] at 5:30AM.  Call this number if you have problems the morning of surgery: [phone]   Remember:   Do not eat food or drink liquids after midnight.    Take these medicines the morning of surgery with A SIP OF WATER: Nexium, Cymbal,Verapamil SR, Ascol..  Hydrocodone- Acetaminophen if needed.  Stop taking Aspirin, Coumadin, Plavix, Effient and Herbal medications.  Do not take any NSAIDs ie: Ibuprofen,  Advil,Naproxen or any medication containing Aspirin.   Do not wear jewelry, make-up or nail polish.  Do not wear lotions, powders, or perfumes.   Do not shave 48 hours prior to surgery. Men may shave face and neck.  Do not bring valuables to the hospital.  Contacts, dentures or bridgework may not be worn into surgery.  Leave suitcase in the car. After surgery it may be brought to your room.  For patients admitted to the hospital, checkout time is 11:00 AM the day of  discharge.    Special Instructions: Shower using CHG 2 nights before surgery and the night before surgery.  If you shower the day of surgery use CHG.  Use special wash - you have one bottle of CHG for all showers.  You should use approximately 1/3 of the bottle for each shower.   Please read over the following fact sheets that you were given: Pain Booklet, Coughing and Deep Breathing, Blood Transfusion Information and Surgical Site Infection Prevention

## 2012-09-14 NOTE — Pre-Procedure Instructions (Signed)
Carmen Reynolds  09/14/2012   Your procedure is scheduled on:  Thursday, February 27th.  Report to [place] at 5:30AM.  Call this number if you have problems the morning of surgery: [phone]   Remember:   Do not eat food or drink liquids after midnight.    Take these medicines the morning of surgery with A SIP OF WATER: None   Do not wear jewelry, make-up or nail polish.  Do not wear lotions, powders, or perfumes.   Do not shave 48 hours prior to surgery. Men may shave face and neck.  Do not bring valuables to the hospital.  Contacts, dentures or bridgework may not be worn into surgery.  Leave suitcase in the car. After surgery it may be brought to your room.  For patients admitted to the hospital, checkout time is 11:00 AM the day of  discharge.    Special Instructions: Shower using CHG 2 nights before surgery and the night before surgery.  If you shower the day of surgery use CHG.  Use special wash - you have one bottle of CHG for all showers.  You should use approximately 1/3 of the bottle for each shower.   Please read over the following fact sheets that you were given: Pain Booklet, Coughing and Deep Breathing, Blood Transfusion Information and Surgical Site Infection Prevention

## 2012-09-14 NOTE — Progress Notes (Signed)
[name] is having labs drawn at [facility] [employer] today (she is a former employee there).  LAB results will be sent to Dr Louanne Belton.  I faxed a request to Dr Junius Roads office requesting lab results and EKG.

## 2012-09-21 MED ORDER — CEFAZOLIN SODIUM-DEXTROSE 2-3 GM-% IV SOLR
2.0000 g | INTRAVENOUS | Status: AC
  Administered 2012-09-22 (×2): 2 g via INTRAVENOUS
  Filled 2012-09-21: qty 50

## 2012-09-21 NOTE — H&P (Signed)
Carmen Reynolds  #[id number] DOB:  [birth year]    06/29/2012:  Carmen Reynolds comes in today to discuss her MRI. She says that she has gotten some short-lived relief with epidural steroid injections by Dr. Maryjean Ka, but says that she is not getting sustained relief as a result of that.    She continues to use Hydrocodone on a prn basis.    We talked about options at this point we recommend anterolateral decompression and fusion T10 through L2 levels with extension to the previous hardware from her previous fusion surgery.  She was fitted for a TLSO brace.  She is aware of the risks and benefits of surgery and wishes to schedule this as soon as possible.  We set up the schedule for 08/12/2012.  Risks and benefits were discussed in detail with the patient who wishes to proceed with surgery.          Marchia Meiers. Vertell Limber, M.D./aft     Charm Rings  [id number] DOB:  6/254/1952    12/07/2011:     Carmen Reynolds presents today with her husband to review her imaging studies with them and discuss her situation.  She has had three injections.  She said the first gave her relief, the second caused worse pain, and decreased ability to walk.  However, the third injection she said she is improving.  She feels that the pain in her thighs is 1-2/10 and she is able to get relief with position changes.  At this point, she does not feel that she needs to proceed with surgical intervention and I have recommended continued intermittent injections to see if we can manage her pain complaints.    She will followup with me in a few months for recheck.  I explained her imaging and my recommendations in detail to her and her husband.           Marchia Meiers. Vertell Limber, M.D./gde    Carmen Reynolds  #[id number]  DOB:  1951-02-05     09/09/2011:  [name] returns today to discuss her interval progress or lack thereof.  She engaged in physical therapy in November.  She said it helped her stability and flexibility, but did not help her pain.  She describes  left lateral thigh and buttock pain.  She says it usually happens when she driving or riding and it is quite bothersome to her.  She has been taking Vicodin rarely.  She is using prednisone from an old prescription.    I reviewed her studies and we went over the specifics of my proposed surgery, which would be anterolateral decompression and fusion L1-2, T12-L1, T11-T12 with pedicle screw fixation from T10 through L1 levels and connecting this with her previous fusion hardware.    She wants to see if she can get any relief with an injection and we will set that up.  If she does better with that, hopefully, we will be able to avoid surgery.  Otherwise, we will proceed that planned surgery.  Risks and benefits of the surgery were discussed in detail with [name].          Marchia Meiers. Vertell Limber, M.D./sv    Charm Rings  #[id number]  DOB:  1950-08-01     04/29/2011:  Carmen Reynolds returns today to review her myelogram and postmyelographic CT scan.  This shows that she has had solid arthrodesis with well positioned hardware at previously operated scoliosis levels, L2 through sacrum.  No evidence of nonunion.  She  has developed interval stenosis at L1-2 and has fairly marked disc degeneration T10-11, T11-12 and T12-L1 levels.  I believe that her symptomatic radicular pain relates to her scoliosis and stenosis above her previous surgery.    I suggested that she could try some physical therapy to help work on strengthening of back muscles and flexibility and she is going to work on that.  We will see how she does with that.  If she does not get relief, we will talk about injections and, if not, then we will talk about anterolateral decompression and fusion T10-11, T11-12, T12-L1 and L1-2 with posterior percutaneous instrumentation.          Marchia Meiers. Vertell Limber, M.D./sv    NEUROSURGICAL CONSULTATION  Carmen Reynolds  #[id number]  DOB:  11-Jul-1951     October 12, 2005   HISTORY:     Carmen Reynolds is a 62 year old woman who  presents for neurosurgical consultation with the chief complaint of low back pain, bilateral hip pain, bilateral lower extremity pain, right greater than left, which she describes as constant.  The pain is now 4 to 6 out of 10 in severity.  She says it is hard to rise from a standing position.  The pain wears her out.  She gets relief with rest or sitting in a bathtub.  She is taking Prednisone 10 mg daily and has been self-administering this.  She has had pain for a long time which has been progressively worsening and it has been at this level for about 9 months.  She says she has numbness in her right hip.  She does not note any significant weakness.  She says she cannot take anti-inflammatory medications or Aspirin or pain medications secondary to colitis.  She has had epidural steroid injections x 3 recently which helped her a little bit, but didn't give her a great deal of relief.  She has a past history significant for scoliosis and colitis.  She has had prior surgery in 1978 in the right foot and 1983 she had a tubal ligation.  She had radical neck dissection in 2005 by a doctor in [place].    DIAGNOSTIC STUDIES:   She had an MRI of her lumbar spine which shows multilevel spinal stenosis most severe at the L4-5 level with significant scoliosis of her lumbar spine.    REVIEW OF SYSTEMS:   Detailed Review of Systems sheet was reviewed with the patient.  Pertinent positives include back pain, joint pain or swelling, inability to concentrate, incoordination in arms and/or legs and anxiety.    PAST MEDICAL HISTORY:      Current Medical Conditions:  Significant for high blood pressure, colitis.      Prior Operations and Hospitalizations:  See above.      Medications and Allergies:  She has no known drug allergies.  She takes HCTZ 25 mg qd, Cymbalta 60 mg qd, Nexium 40 mg qd, Benicar 40 mg half qd and Verapamil 40 mg bid.      Height and Weight:  She is 5 feet, 5 inches tall and weighs 145 lbs.     SOCIAL HISTORY:    She is a nonsmoker, social drinker of alcoholic beverages, no history of substance abuse.    PHYSICAL EXAMINATION:  On examination today Mrs. Hanger is a pleasant, cooperative woman in no acute distress.      Blood Pressure and Pulse:  Blood pressure is 112/76 right arm seated.  Heart rate is 84 and regular.  HEENT - normocephalic, atraumatic.  The pupils are equal, round and reactive to light.  The extraocular muscles are intact.  Sclerae - white.  Conjunctiva - pink.  Oropharynx benign.  Uvula midline.     Neck - there are no masses, meningismus, deformities, tracheal deviation, jugular vein distention or carotid bruits.  There is normal cervical range of motion.  Spurlings' test is negative without reproducible radicular pain turning the patient's head to either side.  Lhermitte's sign is not present with axial compression.      Respiratory - there is normal respiratory effort with good intercostal function.  Lungs are clear to auscultation.  There are no rales, rhonchi or wheezes.      Cardiovascular - the heart has regular rate and rhythm to auscultation.  No murmurs are appreciated.  There is no extremity edema, cyanosis or clubbing.  There are palpable pedal pulses.      Abdomen - soft, nontender, no hepatosplenomegaly appreciated or masses.  There are active bowel sounds.  No guarding or rebound.      Musculoskeletal Examination - she has significant scoliosis and says she cannot stand up straight.  He has right sciatic notch discomfort, midline low back pain.  She is able to bend to within 18 inches of the floor with her upper extremities outstretched.  She is able to stand on her heels and toes.  She walks with an antalgic gait favoring her right lower extremity.  She has a positive straight leg raise at 60 degrees on the right.    NEUROLOGICAL EXAMINATION: The patient is oriented to time, person and place and has good recall of both recent and remote memory with  normal attention span and concentration.  The patient speaks with clear and fluent speech and exhibits normal language function and appropriate fund of knowledge.      Cranial Nerve Examination - pupils are equal, round and reactive to light.  Extraocular movements are full.  Visual fields are full to confrontational testing.  Facial sensation and facial movement are symmetric and intact.  Hearing is intact to finger rub.  Palate is upgoing.  Shoulder shrug is symmetric.  Tongue protrudes in the midline.      Motor Examination - motor strength is 5/5 in the bilateral deltoids, biceps, triceps, handgrips, wrist extensors, interosseous.  In the lower extremities motor strength is 5/5, except for 4/5 right EHL strength and hip abductor strength    Sensory Examination - she notes decreased pin in the left L5 distribution.      Deep Tendon Reflexes - 2 in the biceps, triceps and brachioradialis, 2 at the knees, 2 at the ankles and great toes are downgoing to plantar stimulation.      Cerebellar Examination - normal coordination in upper and lower extremities and normal rapid alternating movements.  Romberg test is negative.    IMPRESSION AND RECOMMENDATIONS:   Clarene Curran is a 62 year old woman with significant scoliosis and has right L5 radiculopathy.  I have recommended that she undergo a course of physical therapy to see if this gives her any relief.  I will see her back in 6 weeks and reassess her progress at that point.    [facility]    Marchia Meiers. Vertell Limber, M.D.

## 2012-09-22 ENCOUNTER — Encounter (HOSPITAL_COMMUNITY): Payer: Self-pay | Admitting: Anesthesiology

## 2012-09-22 ENCOUNTER — Inpatient Hospital Stay (HOSPITAL_COMMUNITY): Payer: BC Managed Care – PPO

## 2012-09-22 ENCOUNTER — Inpatient Hospital Stay (HOSPITAL_COMMUNITY)
Admission: RE | Admit: 2012-09-22 | Discharge: 2012-09-26 | DRG: 558 | Disposition: A | Discharge status: Home / Self Care | Payer: BC Managed Care – PPO | Source: Ambulatory Visit | Attending: Neurosurgery | Admitting: Neurosurgery

## 2012-09-22 ENCOUNTER — Inpatient Hospital Stay (HOSPITAL_COMMUNITY): Payer: BC Managed Care – PPO | Admitting: Anesthesiology

## 2012-09-22 ENCOUNTER — Encounter (HOSPITAL_COMMUNITY)
Admission: RE | Disposition: A | Discharge status: Home / Self Care | Payer: Self-pay | Source: Ambulatory Visit | Attending: Neurosurgery

## 2012-09-22 ENCOUNTER — Encounter (HOSPITAL_COMMUNITY): Payer: Self-pay | Admitting: *Deleted

## 2012-09-22 DIAGNOSIS — Z79899 Other long term (current) drug therapy: Secondary | ICD-10-CM

## 2012-09-22 DIAGNOSIS — Q762 Congenital spondylolisthesis: Secondary | ICD-10-CM

## 2012-09-22 DIAGNOSIS — D62 Acute posthemorrhagic anemia: Secondary | ICD-10-CM | POA: Diagnosis not present

## 2012-09-22 DIAGNOSIS — J90 Pleural effusion, not elsewhere classified: Secondary | ICD-10-CM

## 2012-09-22 DIAGNOSIS — Z7982 Long term (current) use of aspirin: Secondary | ICD-10-CM

## 2012-09-22 DIAGNOSIS — Z23 Encounter for immunization: Secondary | ICD-10-CM

## 2012-09-22 DIAGNOSIS — K5900 Constipation, unspecified: Secondary | ICD-10-CM | POA: Diagnosis not present

## 2012-09-22 DIAGNOSIS — Y831 Surgical operation with implant of artificial internal device as the cause of abnormal reaction of the patient, or of later complication, without mention of misadventure at the time of the procedure: Secondary | ICD-10-CM | POA: Diagnosis not present

## 2012-09-22 DIAGNOSIS — J95811 Postprocedural pneumothorax: Secondary | ICD-10-CM | POA: Diagnosis not present

## 2012-09-22 DIAGNOSIS — Y9210 Unspecified residential institution as the place of occurrence of the external cause: Secondary | ICD-10-CM | POA: Diagnosis not present

## 2012-09-22 DIAGNOSIS — M4120 Other idiopathic scoliosis, site unspecified: Principal | ICD-10-CM | POA: Diagnosis present

## 2012-09-22 DIAGNOSIS — K219 Gastro-esophageal reflux disease without esophagitis: Secondary | ICD-10-CM | POA: Diagnosis present

## 2012-09-22 DIAGNOSIS — I10 Essential (primary) hypertension: Secondary | ICD-10-CM | POA: Diagnosis present

## 2012-09-22 DIAGNOSIS — IMO0001 Reserved for inherently not codable concepts without codable children: Secondary | ICD-10-CM | POA: Diagnosis present

## 2012-09-22 HISTORY — PX: CHEST TUBE INSERTION: SHX231

## 2012-09-22 HISTORY — PX: ANTERIOR LATERAL LUMBAR FUSION 4 LEVELS: SHX5552

## 2012-09-22 LAB — BASIC METABOLIC PANEL
BUN: 23 mg/dL (ref 6–23)
Calcium: 8.1 mg/dL — ABNORMAL LOW (ref 8.4–10.5)
GFR calc Af Amer: >90 mL/min (ref >90)
GFR calc non Af Amer: >90 mL/min (ref >90)
Glucose, Bld: 135 mg/dL — ABNORMAL HIGH (ref 70–99)
Sodium: 141 mEq/L (ref 135–145)

## 2012-09-22 LAB — CBC
MCH: 30.9 pg (ref 26.0–34.0)
MCHC: 32.7 g/dL (ref 30.0–36.0)
Platelets: 185 10*3/uL (ref 150–400)
RBC: 2.98 MIL/uL — ABNORMAL LOW (ref 3.87–5.11)

## 2012-09-22 SURGERY — ANTERIOR LATERAL LUMBAR FUSION 4 LEVELS
Anesthesia: General | Site: Chest | Wound class: Clean

## 2012-09-22 MED ORDER — NEOSTIGMINE METHYLSULFATE 1 MG/ML IJ SOLN
INTRAMUSCULAR | Status: DC | PRN
  Administered 2012-09-22: 3 mg via INTRAVENOUS

## 2012-09-22 MED ORDER — PSYLLIUM 95 % PO PACK
1.0000 | PACK | Freq: Two times a day (BID) | ORAL | Status: DC | PRN
  Filled 2012-09-22: qty 1

## 2012-09-22 MED ORDER — SODIUM CHLORIDE 0.9 % IV SOLN
INTRAVENOUS | Status: AC
  Filled 2012-09-22: qty 500

## 2012-09-22 MED ORDER — CEFAZOLIN SODIUM-DEXTROSE 2-3 GM-% IV SOLR: 2.0000 g | INTRAVENOUS | Status: DC

## 2012-09-22 MED ORDER — OXYCODONE-ACETAMINOPHEN 5-325 MG PO TABS
1.0000 | ORAL_TABLET | ORAL | Status: DC | PRN
  Administered 2012-09-22 – 2012-09-23 (×2): 2 via ORAL
  Administered 2012-09-24 (×2): 1 via ORAL
  Administered 2012-09-24: 2 via ORAL
  Administered 2012-09-24 – 2012-09-25 (×3): 1 via ORAL
  Administered 2012-09-25 – 2012-09-26 (×4): 2 via ORAL
  Filled 2012-09-22: qty 2
  Filled 2012-09-22: qty 1
  Filled 2012-09-22 (×5): qty 2
  Filled 2012-09-22: qty 1
  Filled 2012-09-22: qty 2
  Filled 2012-09-22: qty 1
  Filled 2012-09-22: qty 2

## 2012-09-22 MED ORDER — ASPIRIN EC 81 MG PO TBEC
81.0000 mg | DELAYED_RELEASE_TABLET | Freq: Every day | ORAL | Status: DC
  Administered 2012-09-23 – 2012-09-26 (×4): 81 mg via ORAL
  Filled 2012-09-22 (×4): qty 1

## 2012-09-22 MED ORDER — SODIUM CHLORIDE 0.9 % IJ SOLN: 3.0000 mL | INTRAMUSCULAR | Status: DC | PRN

## 2012-09-22 MED ORDER — ROCURONIUM BROMIDE 100 MG/10ML IV SOLN
INTRAVENOUS | Status: DC | PRN
  Administered 2012-09-22 (×3): 10 mg via INTRAVENOUS
  Administered 2012-09-22: 20 mg via INTRAVENOUS
  Administered 2012-09-22: 10 mg via INTRAVENOUS
  Administered 2012-09-22: 20 mg via INTRAVENOUS
  Administered 2012-09-22: 10 mg via INTRAVENOUS
  Administered 2012-09-22: 50 mg via INTRAVENOUS

## 2012-09-22 MED ORDER — HYDROMORPHONE HCL PF 1 MG/ML IJ SOLN
0.2500 mg | INTRAMUSCULAR | Status: DC | PRN
Start: 2012-09-22 — End: 2012-09-22
  Administered 2012-09-22 (×2): 0.5 mg via INTRAVENOUS

## 2012-09-22 MED ORDER — PHENYLEPHRINE HCL 10 MG/ML IJ SOLN
INTRAMUSCULAR | Status: DC | PRN
  Administered 2012-09-22: 80 ug via INTRAVENOUS
  Administered 2012-09-22: 40 ug via INTRAVENOUS
  Administered 2012-09-22 (×2): 80 ug via INTRAVENOUS
  Administered 2012-09-22: 120 ug via INTRAVENOUS

## 2012-09-22 MED ORDER — PREDNISONE 10 MG PO TABS
10.0000 mg | ORAL_TABLET | Freq: Every day | ORAL | Status: DC | PRN
  Filled 2012-09-22: qty 1

## 2012-09-22 MED ORDER — VITAMIN D3 25 MCG (1000 UNIT) PO TABS: 3000.0000 [IU] | ORAL_TABLET | Freq: Two times a day (BID) | ORAL | Status: DC

## 2012-09-22 MED ORDER — SODIUM CHLORIDE 0.9 % IV SOLN
10.0000 mg | INTRAVENOUS | Status: DC | PRN
  Administered 2012-09-22: 40 ug/min via INTRAVENOUS

## 2012-09-22 MED ORDER — SODIUM CHLORIDE 0.9 % IV BOLUS (SEPSIS)
250.0000 mL | Freq: Once | INTRAVENOUS | Status: AC
  Administered 2012-09-22: 250 mL via INTRAVENOUS

## 2012-09-22 MED ORDER — METHYLCELLULOSE (LAXATIVE) 500 MG PO TABS: 1000.0000 mg | ORAL_TABLET | Freq: Two times a day (BID) | ORAL | Status: DC

## 2012-09-22 MED ORDER — RED YEAST RICE EXTRACT 600 MG PO TABS: 1200.0000 mg | ORAL_TABLET | Freq: Two times a day (BID) | ORAL | Status: DC

## 2012-09-22 MED ORDER — HYDROCODONE-ACETAMINOPHEN 5-325 MG PO TABS: 1.0000 | ORAL_TABLET | Freq: Four times a day (QID) | ORAL | Status: DC | PRN

## 2012-09-22 MED ORDER — OXYCODONE HCL 5 MG PO TABS: 5.0000 mg | ORAL_TABLET | Freq: Once | ORAL | Status: DC | PRN

## 2012-09-22 MED ORDER — HYDROMORPHONE HCL PF 1 MG/ML IJ SOLN
INTRAMUSCULAR | Status: AC
  Administered 2012-09-23: 1 mg via INTRAVENOUS
  Filled 2012-09-22: qty 1

## 2012-09-22 MED ORDER — MENTHOL 3 MG MT LOZG: 1.0000 | LOZENGE | OROMUCOSAL | Status: DC | PRN

## 2012-09-22 MED ORDER — PREDNISONE 5 MG PO TABS: 5.0000 mg | ORAL_TABLET | Freq: Every day | ORAL | Status: DC | PRN

## 2012-09-22 MED ORDER — DIAZEPAM 5 MG PO TABS
5.0000 mg | ORAL_TABLET | Freq: Four times a day (QID) | ORAL | Status: DC | PRN
  Administered 2012-09-22 – 2012-09-26 (×10): 5 mg via ORAL
  Filled 2012-09-22 (×9): qty 1

## 2012-09-22 MED ORDER — SODIUM CHLORIDE 0.9 % IJ SOLN
3.0000 mL | Freq: Two times a day (BID) | INTRAMUSCULAR | Status: DC
  Administered 2012-09-23: 3 mL via INTRAVENOUS

## 2012-09-22 MED ORDER — PROMETHAZINE HCL 25 MG/ML IJ SOLN: 6.2500 mg | INTRAMUSCULAR | Status: DC | PRN

## 2012-09-22 MED ORDER — ARTIFICIAL TEARS OP OINT
TOPICAL_OINTMENT | OPHTHALMIC | Status: DC | PRN
  Administered 2012-09-22: 1 via OPHTHALMIC

## 2012-09-22 MED ORDER — LIDOCAINE-EPINEPHRINE 1 %-1:100000 IJ SOLN
INTRAMUSCULAR | Status: DC | PRN
  Administered 2012-09-22: 5 mL
  Administered 2012-09-22: 3 mL

## 2012-09-22 MED ORDER — ONDANSETRON HCL 4 MG/2ML IJ SOLN: 4.0000 mg | INTRAMUSCULAR | Status: DC | PRN

## 2012-09-22 MED ORDER — LIDOCAINE HCL (CARDIAC) 20 MG/ML IV SOLN
INTRAVENOUS | Status: DC | PRN
  Administered 2012-09-22: 80 mg via INTRAVENOUS

## 2012-09-22 MED ORDER — PROPOFOL INFUSION 10 MG/ML OPTIME
INTRAVENOUS | Status: DC | PRN
  Administered 2012-09-22: 50 ug/kg/min via INTRAVENOUS

## 2012-09-22 MED ORDER — OXYCODONE HCL 5 MG/5ML PO SOLN: 5.0000 mg | Freq: Once | ORAL | Status: DC | PRN

## 2012-09-22 MED ORDER — OXYCODONE-ACETAMINOPHEN 5-325 MG PO TABS
ORAL_TABLET | ORAL | Status: AC
  Filled 2012-09-22: qty 2

## 2012-09-22 MED ORDER — VITAMIN D3 25 MCG (1000 UNIT) PO TABS
2000.0000 [IU] | ORAL_TABLET | Freq: Two times a day (BID) | ORAL | Status: DC
  Administered 2012-09-22 – 2012-09-26 (×8): 2000 [IU] via ORAL
  Filled 2012-09-22 (×10): qty 2

## 2012-09-22 MED ORDER — MIDAZOLAM HCL 5 MG/5ML IJ SOLN
INTRAMUSCULAR | Status: DC | PRN
  Administered 2012-09-22: 2 mg via INTRAVENOUS

## 2012-09-22 MED ORDER — MEPERIDINE HCL 25 MG/ML IJ SOLN: 6.2500 mg | INTRAMUSCULAR | Status: DC | PRN

## 2012-09-22 MED ORDER — BACITRACIN 50000 UNITS IM SOLR
INTRAMUSCULAR | Status: AC
  Filled 2012-09-22: qty 1

## 2012-09-22 MED ORDER — DULOXETINE HCL 60 MG PO CPEP
60.0000 mg | ORAL_CAPSULE | Freq: Every day | ORAL | Status: DC
  Administered 2012-09-23 – 2012-09-26 (×4): 60 mg via ORAL
  Filled 2012-09-22 (×5): qty 1

## 2012-09-22 MED ORDER — ONDANSETRON HCL 4 MG/2ML IJ SOLN
INTRAMUSCULAR | Status: DC | PRN
  Administered 2012-09-22: 4 mg via INTRAVENOUS

## 2012-09-22 MED ORDER — LACTATED RINGERS IV SOLN
INTRAVENOUS | Status: DC | PRN
  Administered 2012-09-22 (×3): via INTRAVENOUS

## 2012-09-22 MED ORDER — BISACODYL 10 MG RE SUPP
10.0000 mg | Freq: Every day | RECTAL | Status: DC | PRN
  Filled 2012-09-22: qty 1

## 2012-09-22 MED ORDER — SODIUM CHLORIDE 0.9 % IV SOLN: 250.0000 mL | INTRAVENOUS | Status: DC

## 2012-09-22 MED ORDER — IRBESARTAN 300 MG PO TABS
300.0000 mg | ORAL_TABLET | Freq: Every day | ORAL | Status: DC
  Filled 2012-09-22 (×4): qty 1

## 2012-09-22 MED ORDER — CEFAZOLIN SODIUM 1-5 GM-% IV SOLN
1.0000 g | Freq: Three times a day (TID) | INTRAVENOUS | Status: AC
  Administered 2012-09-22 – 2012-09-23 (×2): 1 g via INTRAVENOUS
  Filled 2012-09-22 (×2): qty 50

## 2012-09-22 MED ORDER — DOCUSATE SODIUM 100 MG PO CAPS
100.0000 mg | ORAL_CAPSULE | Freq: Two times a day (BID) | ORAL | Status: DC
  Administered 2012-09-22 – 2012-09-26 (×7): 100 mg via ORAL
  Filled 2012-09-22 (×8): qty 1

## 2012-09-22 MED ORDER — VANCOMYCIN HCL IN DEXTROSE 1-5 GM/200ML-% IV SOLN
INTRAVENOUS | Status: AC
  Filled 2012-09-22: qty 200

## 2012-09-22 MED ORDER — FLEET ENEMA 7-19 GM/118ML RE ENEM: 1.0000 | ENEMA | Freq: Once | RECTAL | Status: AC | PRN

## 2012-09-22 MED ORDER — ACETAMINOPHEN 650 MG RE SUPP: 650.0000 mg | RECTAL | Status: DC | PRN

## 2012-09-22 MED ORDER — DIAZEPAM 5 MG PO TABS
ORAL_TABLET | ORAL | Status: AC
  Filled 2012-09-22: qty 1

## 2012-09-22 MED ORDER — VERAPAMIL HCL ER 240 MG PO TBCR
240.0000 mg | EXTENDED_RELEASE_TABLET | Freq: Every day | ORAL | Status: DC
  Filled 2012-09-22 (×4): qty 1

## 2012-09-22 MED ORDER — ACETAMINOPHEN 325 MG PO TABS
650.0000 mg | ORAL_TABLET | ORAL | Status: DC | PRN
  Administered 2012-09-23: 650 mg via ORAL
  Filled 2012-09-22: qty 2

## 2012-09-22 MED ORDER — THROMBIN 5000 UNITS EX SOLR
CUTANEOUS | Status: DC | PRN
  Administered 2012-09-22: 5000 [IU] via TOPICAL

## 2012-09-22 MED ORDER — PHENOL 1.4 % MT LIQD: 1.0000 | OROMUCOSAL | Status: DC | PRN

## 2012-09-22 MED ORDER — MESALAMINE 400 MG PO TBEC
800.0000 mg | DELAYED_RELEASE_TABLET | Freq: Every day | ORAL | Status: DC
  Filled 2012-09-22: qty 2

## 2012-09-22 MED ORDER — GLYCOPYRROLATE 0.2 MG/ML IJ SOLN
INTRAMUSCULAR | Status: DC | PRN
  Administered 2012-09-22: 0.4 mg via INTRAVENOUS

## 2012-09-22 MED ORDER — HYDROCHLOROTHIAZIDE 12.5 MG PO CAPS
12.5000 mg | ORAL_CAPSULE | Freq: Every day | ORAL | Status: DC
  Filled 2012-09-22: qty 1

## 2012-09-22 MED ORDER — KCL IN DEXTROSE-NACL 20-5-0.45 MEQ/L-%-% IV SOLN
INTRAVENOUS | Status: DC
  Administered 2012-09-22 – 2012-09-24 (×4): via INTRAVENOUS
  Filled 2012-09-22 (×12): qty 1000

## 2012-09-22 MED ORDER — CEFAZOLIN SODIUM-DEXTROSE 2-3 GM-% IV SOLR
INTRAVENOUS | Status: AC
  Filled 2012-09-22: qty 50

## 2012-09-22 MED ORDER — CO Q 10 100 MG PO CAPS: 200.0000 mg | ORAL_CAPSULE | Freq: Every day | ORAL | Status: DC

## 2012-09-22 MED ORDER — SUCCINYLCHOLINE CHLORIDE 20 MG/ML IJ SOLN
INTRAMUSCULAR | Status: DC | PRN
  Administered 2012-09-22: 120 mg via INTRAVENOUS

## 2012-09-22 MED ORDER — HYDROMORPHONE HCL PF 1 MG/ML IJ SOLN
0.5000 mg | INTRAMUSCULAR | Status: DC | PRN
  Administered 2012-09-22 – 2012-09-24 (×4): 1 mg via INTRAVENOUS
  Filled 2012-09-22 (×5): qty 1

## 2012-09-22 MED ORDER — PROPOFOL 10 MG/ML IV BOLUS
INTRAVENOUS | Status: DC | PRN
  Administered 2012-09-22: 30 mg via INTRAVENOUS
  Administered 2012-09-22: 170 mg via INTRAVENOUS

## 2012-09-22 MED ORDER — BUPIVACAINE HCL (PF) 0.5 % IJ SOLN
INTRAMUSCULAR | Status: DC | PRN
  Administered 2012-09-22: 3 mL
  Administered 2012-09-22: 5 mL

## 2012-09-22 MED ORDER — ZOLPIDEM TARTRATE 5 MG PO TABS
5.0000 mg | ORAL_TABLET | Freq: Every evening | ORAL | Status: DC | PRN
  Administered 2012-09-23: 5 mg via ORAL
  Filled 2012-09-22: qty 1

## 2012-09-22 MED ORDER — HEMOSTATIC AGENTS (NO CHARGE) OPTIME
TOPICAL | Status: DC | PRN
  Administered 2012-09-22: 1 via TOPICAL

## 2012-09-22 MED ORDER — SENNOSIDES-DOCUSATE SODIUM 8.6-50 MG PO TABS
1.0000 | ORAL_TABLET | Freq: Every evening | ORAL | Status: DC | PRN
  Administered 2012-09-24: 1 via ORAL
  Filled 2012-09-22: qty 1

## 2012-09-22 MED ORDER — HYDROCODONE-ACETAMINOPHEN 5-325 MG PO TABS
1.0000 | ORAL_TABLET | ORAL | Status: DC | PRN
  Administered 2012-09-22: 2 via ORAL
  Administered 2012-09-23: 1 via ORAL
  Administered 2012-09-23: 2 via ORAL
  Administered 2012-09-23 (×2): 1 via ORAL
  Filled 2012-09-22: qty 2
  Filled 2012-09-22: qty 1
  Filled 2012-09-22 (×2): qty 2
  Filled 2012-09-22: qty 1
  Filled 2012-09-22: qty 2

## 2012-09-22 MED ORDER — PANTOPRAZOLE SODIUM 40 MG PO TBEC
80.0000 mg | DELAYED_RELEASE_TABLET | Freq: Every day | ORAL | Status: DC
  Administered 2012-09-23 – 2012-09-25 (×4): 80 mg via ORAL
  Filled 2012-09-22 (×4): qty 2

## 2012-09-22 MED ORDER — VANCOMYCIN HCL 1000 MG IV SOLR
1000.0000 mg | Freq: Once | INTRAVENOUS | Status: AC
  Administered 2012-09-22: 1000 mg via INTRAVENOUS

## 2012-09-22 MED ORDER — 0.9 % SODIUM CHLORIDE (POUR BTL) OPTIME
TOPICAL | Status: DC | PRN
  Administered 2012-09-22 (×3): 1000 mL

## 2012-09-22 MED ORDER — FENTANYL CITRATE 0.05 MG/ML IJ SOLN
INTRAMUSCULAR | Status: DC | PRN
  Administered 2012-09-22 (×2): 50 ug via INTRAVENOUS
  Administered 2012-09-22: 100 ug via INTRAVENOUS
  Administered 2012-09-22: 50 ug via INTRAVENOUS
  Administered 2012-09-22 (×3): 100 ug via INTRAVENOUS
  Administered 2012-09-22: 50 ug via INTRAVENOUS
  Administered 2012-09-22: 100 ug via INTRAVENOUS
  Administered 2012-09-22 (×6): 50 ug via INTRAVENOUS

## 2012-09-22 SURGICAL SUPPLY — 92 items
20FR TROCAR CATHETER ×3 IMPLANT
ALPHATEC TARGETING NEEDLE, BEVEL TIP ×3 IMPLANT
ALPHATEC TARGETING NEEDLE, TROCAR TIP ×3 IMPLANT
BAG DECANTER FOR FLEXI CONT (MISCELLANEOUS) IMPLANT
BENZOIN TINCTURE PRP APPL 2/3 (GAUZE/BANDAGES/DRESSINGS) IMPLANT
BLADE SURG ROTATE 9660 (MISCELLANEOUS) IMPLANT
BONE VOID FILLER STRIP 10CC (Bone Implant) ×9 IMPLANT
BUR MATCHSTICK NEURO 3.0 LAGG (BURR) ×3 IMPLANT
BUR ROUND FLUTED 5 RND (BURR) ×3 IMPLANT
CLOTH BEACON ORANGE TIMEOUT ST (SAFETY) ×6 IMPLANT
CONT SPEC 4OZ CLIKSEAL STRL BL (MISCELLANEOUS) ×6 IMPLANT
COROENT PLUS 8X18X45 (Cage) ×6 IMPLANT
COVER BACK TABLE 24X17X13 BIG (DRAPES) IMPLANT
COVER TABLE BACK 60X90 (DRAPES) ×6 IMPLANT
DERMABOND ADVANCED (GAUZE/BANDAGES/DRESSINGS) ×2
DERMABOND ADVANCED .7 DNX12 (GAUZE/BANDAGES/DRESSINGS) ×4 IMPLANT
DRAPE C-ARM 42X72 X-RAY (DRAPES) ×12 IMPLANT
DRAPE C-ARMOR (DRAPES) ×6 IMPLANT
DRAPE LAPAROTOMY 100X72X124 (DRAPES) ×6 IMPLANT
DRAPE POUCH INSTRU U-SHP 10X18 (DRAPES) ×6 IMPLANT
DRAPE SURG 17X23 STRL (DRAPES) ×6 IMPLANT
DRESSING TELFA 8X3 (GAUZE/BANDAGES/DRESSINGS) ×3 IMPLANT
DURAPREP 26ML APPLICATOR (WOUND CARE) ×6 IMPLANT
ELECT REM PT RETURN 9FT ADLT (ELECTROSURGICAL) ×6
ELECTRODE REM PT RTRN 9FT ADLT (ELECTROSURGICAL) ×4 IMPLANT
EUDERMIC 7 ×3 IMPLANT
EVACUATOR 1/8 PVC DRAIN (DRAIN) ×3 IMPLANT
GAUZE SPONGE 4X4 16PLY XRAY LF (GAUZE/BANDAGES/DRESSINGS) ×3 IMPLANT
GLOVE BIO SURGEON STRL SZ8 (GLOVE) ×12 IMPLANT
GLOVE BIO SURGEON STRL SZ8.5 (GLOVE) ×3 IMPLANT
GLOVE BIOGEL PI IND STRL 8 (GLOVE) ×10 IMPLANT
GLOVE BIOGEL PI IND STRL 8.5 (GLOVE) ×6 IMPLANT
GLOVE BIOGEL PI INDICATOR 8 (GLOVE) ×5
GLOVE BIOGEL PI INDICATOR 8.5 (GLOVE) ×3
GLOVE ECLIPSE 7.5 STRL STRAW (GLOVE) ×12 IMPLANT
GLOVE ECLIPSE 8.0 STRL XLNG CF (GLOVE) ×9 IMPLANT
GLOVE EXAM NITRILE LRG STRL (GLOVE) IMPLANT
GLOVE EXAM NITRILE MD LF STRL (GLOVE) IMPLANT
GLOVE EXAM NITRILE XL STR (GLOVE) IMPLANT
GLOVE EXAM NITRILE XS STR PU (GLOVE) IMPLANT
GLOVE INDICATOR 7.0 STRL GRN (GLOVE) ×3 IMPLANT
GLOVE INDICATOR 7.5 STRL GRN (GLOVE) ×3 IMPLANT
GLOVE INDICATOR 8.0 STRL GRN (GLOVE) ×6 IMPLANT
GOWN BRE IMP SLV AUR LG STRL (GOWN DISPOSABLE) ×3 IMPLANT
GOWN BRE IMP SLV AUR XL STRL (GOWN DISPOSABLE) ×9 IMPLANT
GOWN STRL REIN 2XL LVL4 (GOWN DISPOSABLE) ×9 IMPLANT
KIT BASIN OR (CUSTOM PROCEDURE TRAY) ×6 IMPLANT
KIT DILATOR XLIF 5 (KITS) ×2 IMPLANT
KIT INFUSE MEDIUM (Orthopedic Implant) ×3 IMPLANT
KIT MAXCESS (KITS) ×3 IMPLANT
KIT NEEDLE NVM5 EMG ELECT (KITS) ×2 IMPLANT
KIT NEEDLE NVM5 EMG ELECTRODE (KITS) ×1
KIT POSITION SURG JACKSON T1 (MISCELLANEOUS) IMPLANT
KIT ROOM TURNOVER OR (KITS) ×3 IMPLANT
KIT XLIF (KITS) ×1
MARKER SKIN DUAL TIP RULER LAB (MISCELLANEOUS) ×3 IMPLANT
NEEDLE HYPO 25X1 1.5 SAFETY (NEEDLE) ×6 IMPLANT
NS IRRIG 1000ML POUR BTL (IV SOLUTION) ×9 IMPLANT
PACK LAMINECTOMY NEURO (CUSTOM PROCEDURE TRAY) ×6 IMPLANT
PAD ARMBOARD 7.5X6 YLW CONV (MISCELLANEOUS) ×9 IMPLANT
PATTIES SURGICAL .5 X.5 (GAUZE/BANDAGES/DRESSINGS) IMPLANT
PATTIES SURGICAL .5 X1 (DISPOSABLE) IMPLANT
PATTIES SURGICAL 1X1 (DISPOSABLE) IMPLANT
RASP 3.0MM (RASP) ×3 IMPLANT
ROD 130MM (Rod) ×6 IMPLANT
ROD CONNECTOR END TO END (Rod) ×6 IMPLANT
SCREW 35MM PEDICLE (Screw) ×6 IMPLANT
SCREW 40MM (Screw) ×9 IMPLANT
SCREW 45MM (Screw) ×9 IMPLANT
SCREW SET SPINAL STD HEXALOBE (Screw) ×24 IMPLANT
SPONGE GAUZE 4X4 12PLY (GAUZE/BANDAGES/DRESSINGS) ×3 IMPLANT
SPONGE LAP 4X18 X RAY DECT (DISPOSABLE) IMPLANT
SPONGE SURGIFOAM ABS GEL SZ50 (HEMOSTASIS) ×3 IMPLANT
STAPLER SKIN PROX WIDE 3.9 (STAPLE) ×3 IMPLANT
STRIP CLOSURE SKIN 1/2X4 (GAUZE/BANDAGES/DRESSINGS) ×3 IMPLANT
SUT SILK  1 MH (SUTURE) ×1
SUT SILK 1 MH (SUTURE) ×2 IMPLANT
SUT VIC AB 1 CT1 18XBRD ANBCTR (SUTURE) ×6 IMPLANT
SUT VIC AB 1 CT1 8-18 (SUTURE) ×3
SUT VIC AB 2-0 CT1 18 (SUTURE) ×12 IMPLANT
SUT VIC AB 3-0 MH 27 (SUTURE) ×3 IMPLANT
SUT VIC AB 3-0 SH 8-18 (SUTURE) ×15 IMPLANT
SYR 20ML ECCENTRIC (SYRINGE) ×3 IMPLANT
SYR INSULIN 1ML 31GX6 SAFETY (SYRINGE) IMPLANT
SYSTEM SAHARA CHEST DRAIN ATS (WOUND CARE) ×3 IMPLANT
TAPE CLOTH 3X10 TAN LF (GAUZE/BANDAGES/DRESSINGS) ×9 IMPLANT
TAPE CLOTH SURG 4X10 WHT LF (GAUZE/BANDAGES/DRESSINGS) ×6 IMPLANT
TOWEL OR 17X24 6PK STRL BLUE (TOWEL DISPOSABLE) ×6 IMPLANT
TOWEL OR 17X26 10 PK STRL BLUE (TOWEL DISPOSABLE) ×6 IMPLANT
TRAP SPECIMEN MUCOUS 40CC (MISCELLANEOUS) ×3 IMPLANT
TRAY FOLEY CATH 14FRSI W/METER (CATHETERS) ×3 IMPLANT
WATER STERILE IRR 1000ML POUR (IV SOLUTION) ×6 IMPLANT

## 2012-09-22 NOTE — Op Note (Signed)
09/22/2012  3:40 PM  PATIENT:  Carmen Reynolds  62 y.o. female  PRE-OPERATIVE DIAGNOSIS: Thoracolumbar scoliosis, Lumbar stenosis, Lumbar radiculopathy, spondylolisthesis  POST-OPERATIVE DIAGNOSIS: Thoracolumbar scoliosis, Lumbar stenosis, Lumbar radiculopathy, spondylolisthesis  PROCEDURE:  Procedure(s) with comments: ANTERIOR LATERAL LUMBAR FUSION 4 LEVELS (N/A) - Thoracic Twelve Lumbar One,Lumbar One-TwoAnterolateral decompression/fusion with PEEK cages and allograft LUMBAR  PEDICLE SCREW 4 LEVEL (N/A) - Pedicle screws Thoracic ten-lumbar two with posterolateral arthrodesis with autograft, allograft T 10 and L 2 levels CHEST TUBE INSERTION (Left) - Insertion per Dr. Cyndia Bent  SURGEON:  Surgeon(s) and Role:    * Erline Levine, MD - Primary    * Gaye Pollack, MD - Assisting    * Otilio Connors, MD - Assisting  PHYSICIAN ASSISTANT:   ASSISTANTS: [place], RN   ANESTHESIA:   general  EBL:  Total I/O In: 2800 [I.V.:2800] Out: 84 [Urine:340; Blood:275]  BLOOD ADMINISTERED:none  DRAINS: (Medium) Hemovact drain(s) in the surgical site with  Suction Open and 20 Fr Chest Tube(s) in the left chest   LOCAL MEDICATIONS USED:  MARCAINE     SPECIMEN:  No Specimen  DISPOSITION OF SPECIMEN:  N/A  COUNTS:  YES  TOURNIQUET:  * No tourniquets in log *  DICTATION:DICTATION: Patient is a 62 year old with severe spondylosis stenosis and scoliosis of the thoracolumbar spine. She had prior decompression and fusion L2 through S1 levels and has gone on to develop progressive scoliosis and kyphosis with intractable pain despite prolonged conservative management.  It was elected to take her to surgery for anterolateral decompression and posterior pedicle screw fixation.  Procedure: Patient was brought to the operating room and placed in a right lateral decubitus position on the operative table and using orthogonally projected C-arm fluoroscopy the patient was placed so that the L12 and T12L1 levels were  visualized in AP and lateral plane. The patient was then taped into position. The table was flexed so as to expose these levels. Skin was marked along with a posterior finger dissection incision. His flank was then prepped and draped in usual sterile fashion and incisions were made overlying the 11th rib. Posterior finger dissection was made to enter the retroperitoneal space and then subsequently the probe was inserted into the disc space at L12 fro  the left side. After mapping the neural elements were able to dock the probe per the midpoint of this vertebral level and without indications electrically of too close proximity to the neural tissues. Subsequently the self-retaining tractor was.after sequential dilators were utilized the shim was employed and the interspace was cleared of muscle and then incised. A thorough discectomy was performed. Angled instruments were used to clear the interspace of disc material. After thorough discectomy was performed and this was performed using AP and lateral fluoroscopy an 8 lordotic by 45 x 18 mm implant was packed with BMP and NexOss with autologous blood. This was tamped into position using the slides and its position was confirmed on AP and lateral fluoroscopy. Subsequently exposure was performed at the T12L1 level and similar dissection was performed with locking of the self-retaining retractor after using the same incision and going above the 10th rib. [name] A small opening was made in the parietal pleura despite careful finger dissection exposing the lung. At the conclusion of the lateral portion of this surgery, I asked Dr. Cyndia Bent to place a chest tube prior to placing the patient in a prone position for the second part of the surgery. At this level were able to  place an 8 lordotic by 18 x 45 mm implant packed in a similar fashion. I elected not to place additional implants at the higher levels and felt that I had been able to get significant correction of patient's  kyphosis.  Hemostasis was assured the wounds were irrigated and closed with interrupted Vicryl sutures.  Patient was then turned into a prone position on the operating table on chest rolls and using AP and lateral fluoroscopy throughout this portion of the procedure, her prior scar was excised and the superior aspect of the hardware was exposed, including cross connector.  A chisel was used to remove overlying bone and the bone mass was inspected and found to be densely fused without abnormal motion or evidence of pseudoarthrosis. The metal cutting bur was used to cut the rods just below L2 and the L2 screws were removed.  While I had initially planned on percutaneous pedicle screw fixation, I elected to extend the incision cephalad and place screws in an open fashion, so as to be able to perform a posterolateral arthrodesis as well.  Pedicle screws were placed using Alphatech Zodiac screws. 2 screws were placed at L1 and (6.5 x 45 mm) and 2 at T12 and 2 at T11 of a similar size and 2 at T10 of similar size. 130 mm rods were then affixed to the screw heads with straight connectors to the previously placed rods at the superior aspect of the prior construct and locked down on the screws. All connections were then torqued.  The posterolateral region from T10-L2 was then extensively decorticated and packed with the remaining BMP and NexOss along with remaining local autograft. A Medium Hemovac drain was placed through a separate stab incision and anchored with a 2-0 vicryl stitch. The wounds were irrigated and then closed with 1, 2-0 and 3-0 Vicryl stitches. Sterile occlusive dressing was placed with Dermabond. The patient was then extubated in the operating room and taken to recovery in stable and satisfactory condition having tolerated his operation well. Counts were correct at the end of the case.   PLAN OF CARE: Admit to inpatient   PATIENT DISPOSITION:  PACU - hemodynamically stable.   Delay start of  Pharmacological VTE agent (>24hrs) due to surgical blood loss or risk of bleeding: yes

## 2012-09-22 NOTE — Anesthesia Procedure Notes (Signed)
Procedure Name: Intubation Date/Time: 09/22/2012 7:44 AM Performed by: Sampson Si E Pre-anesthesia Checklist: Patient identified, Timeout performed, Emergency Drugs available, Suction available and Patient being monitored Patient Re-evaluated:Patient Re-evaluated prior to inductionOxygen Delivery Method: Circle system utilized Preoxygenation: Pre-oxygenation with 100% oxygen Intubation Type: IV induction Ventilation: Mask ventilation without difficulty Laryngoscope Size: Mac and 4 Grade View: Grade I Tube type: Oral Tube size: 7.0 mm Number of attempts: 1 Airway Equipment and Method: Stylet Placement Confirmation: ETT inserted through vocal cords under direct vision,  breath sounds checked- equal and bilateral and positive ETCO2 Secured at: 22 cm Tube secured with: Tape Dental Injury: Teeth and Oropharynx as per pre-operative assessment

## 2012-09-22 NOTE — OR Nursing (Signed)
First part of procedure ended at 1041, and the second part began at 52. Neuro monitoring provided by [employer]. Needle electrodes placed by Zadie Cleverly, RN and Dory Day, RN, and approved by Lutricia Feil, [employer] rep. Needles were removed by Denice Bors and Dory Day, RN.

## 2012-09-22 NOTE — Transfer of Care (Signed)
Immediate Anesthesia Transfer of Care Note  Patient: Carmen Reynolds  Procedure(s) Performed: Procedure(s) with comments: ANTERIOR LATERAL LUMBAR FUSION 4 LEVELS (N/A) - Thoracic Twelve Lumbar One,Lumbar One-TwoAnterolateral decompression/fusion/percutaneous percutaneous pedicle screws  LUMBAR PERCUTANEOUS PEDICLE SCREW 4 LEVEL (N/A) - Pedicle screws Thoracic ten-lumbar two CHEST TUBE INSERTION (Left) - Insertion per Dr. Cyndia Bent  Patient Location: PACU  Anesthesia Type:General  Level of Consciousness: awake, alert  and oriented  Airway & Oxygen Therapy: Patient Spontanous Breathing and Patient connected to nasal cannula oxygen  Post-op Assessment: Report given to PACU RN and Patient moving all extremities X 4  Post vital signs: Reviewed and stable  Complications: No apparent anesthesia complications

## 2012-09-22 NOTE — Progress Notes (Signed)
Awake, alert, conversant.  MAEW with full power.  Doing well.

## 2012-09-22 NOTE — Anesthesia Preprocedure Evaluation (Signed)
Anesthesia Evaluation  Patient identified by MRN, date of birth, ID band Patient awake    Airway Mallampati: I      Dental  (+) Teeth Intact   Pulmonary neg pulmonary ROS,  breath sounds clear to auscultation        Cardiovascular hypertension, Rhythm:Regular Rate:Normal     Neuro/Psych negative neurological ROS     GI/Hepatic PUD, GERD-  ,  Endo/Other  negative endocrine ROS  Renal/GU      Musculoskeletal  (+) Fibromyalgia -  Abdominal   Peds  Hematology negative hematology ROS (+)   Anesthesia Other Findings   Reproductive/Obstetrics                           Anesthesia Physical Anesthesia Plan  ASA: II  Anesthesia Plan: General   Post-op Pain Management:    Induction: Intravenous  Airway Management Planned: Oral ETT  Additional Equipment:   Intra-op Plan:   Post-operative Plan: Extubation in OR  Informed Consent: I have reviewed the patients History and Physical, chart, labs and discussed the procedure including the risks, benefits and alternatives for the proposed anesthesia with the patient or authorized representative who has indicated his/her understanding and acceptance.   Dental advisory given  Plan Discussed with: CRNA and Surgeon  Anesthesia Plan Comments:         Anesthesia Quick Evaluation

## 2012-09-22 NOTE — Brief Op Note (Signed)
09/22/2012  3:40 PM  PATIENT:  Carmen Reynolds  62 y.o. female  PRE-OPERATIVE DIAGNOSIS: Thoracolumbar scoliosis, Lumbar stenosis, Lumbar radiculopathy, spondylolisthesis  POST-OPERATIVE DIAGNOSIS: Thoracolumbar scoliosis, Lumbar stenosis, Lumbar radiculopathy, spondylolisthesis  PROCEDURE:  Procedure(s) with comments: ANTERIOR LATERAL LUMBAR FUSION 4 LEVELS (N/A) - Thoracic Twelve Lumbar One,Lumbar One-TwoAnterolateral decompression/fusion with PEEK cages and allograft LUMBAR  PEDICLE SCREW 4 LEVEL (N/A) - Pedicle screws Thoracic ten-lumbar two with posterolateral arthrodesis with autograft, allograft T 10 and L 2 levels CHEST TUBE INSERTION (Left) - Insertion per Dr. Cyndia Bent  SURGEON:  Surgeon(s) and Role:    * Erline Levine, MD - Primary    * Gaye Pollack, MD - Assisting    * Otilio Connors, MD - Assisting  PHYSICIAN ASSISTANT:   ASSISTANTS: Pote[place], RN   ANESTHESIA:   general  EBL:  Total I/O In: 2800 [I.V.:2800] Out: 83 [Urine:340; Blood:275]  BLOOD ADMINISTERED:none  DRAINS: (Medium) Hemovact drain(s) in the surgical site with  Suction Open and 20 Fr Chest Tube(s) in the left chest   LOCAL MEDICATIONS USED:  MARCAINE     SPECIMEN:  No Specimen  DISPOSITION OF SPECIMEN:  N/A  COUNTS:  YES  TOURNIQUET:  * No tourniquets in log *  DICTATION:DICTATION: Patient is a 62 year old with severe spondylosis stenosis and scoliosis of the thoracolumbar spine. She had prior decompression and fusion L2 through S1 levels and has gone on to develop progressive scoliosis and kyphosis with intractable pain despite prolonged conservative management.  It was elected to take her to surgery for anterolateral decompression and posterior pedicle screw fixation.  Procedure: Patient was brought to the operating room and placed in a right lateral decubitus position on the operative table and using orthogonally projected C-arm fluoroscopy the patient was placed so that the L12 and T12L1 levels were  visualized in AP and lateral plane. The patient was then taped into position. The table was flexed so as to expose these levels. Skin was marked along with a posterior finger dissection incision. His flank was then prepped and draped in usual sterile fashion and incisions were made overlying the 11th rib. Posterior finger dissection was made to enter the retroperitoneal space and then subsequently the probe was inserted into the disc space at L12 fro  the left side. After mapping the neural elements were able to dock the probe per the midpoint of this vertebral level and without indications electrically of too close proximity to the neural tissues. Subsequently the self-retaining tractor was.after sequential dilators were utilized the shim was employed and the interspace was cleared of muscle and then incised. A thorough discectomy was performed. Angled instruments were used to clear the interspace of disc material. After thorough discectomy was performed and this was performed using AP and lateral fluoroscopy an 8 lordotic by 45 x 18 mm implant was packed with BMP and NexOss with autologous blood. This was tamped into position using the slides and its position was confirmed on AP and lateral fluoroscopy. Subsequently exposure was performed at the T12L1 level and similar dissection was performed with locking of the self-retaining retractor after using the same incision and going above the 10th rib. [name] A small opening was made in the parietal pleura despite careful finger dissection exposing the lung. At the conclusion of the lateral portion of this surgery, I asked Dr. Cyndia Bent to place a chest tube prior to placing the patient in a prone position for the second part of the surgery. At this level were able to  place an 8 lordotic by 18 x 45 mm implant packed in a similar fashion. I elected not to place additional implants at the higher levels and felt that I had been able to get significant correction of patient's  kyphosis.  Hemostasis was assured the wounds were irrigated and closed with interrupted Vicryl sutures.  Patient was then turned into a prone position on the operating table on chest rolls and using AP and lateral fluoroscopy throughout this portion of the procedure, her prior scar was excised and the superior aspect of the hardware was exposed, including cross connector.  A chisel was used to remove overlying bone and the bone mass was inspected and found to be densely fused without abnormal motion or evidence of pseudoarthrosis. The metal cutting bur was used to cut the rods just below L2 and the L2 screws were removed.  While I had initially planned on percutaneous pedicle screw fixation, I elected to extend the incision cephalad and place screws in an open fashion, so as to be able to perform a posterolateral arthrodesis as well.  Pedicle screws were placed using Alphatech Zodiac screws. 2 screws were placed at L1 and (6.5 x 45 mm) and 2 at T12 and 2 at T11 of a similar size and 2 at T10 of similar size. 130 mm rods were then affixed to the screw heads with straight connectors to the previously placed rods at the superior aspect of the prior construct and locked down on the screws. All connections were then torqued.  The posterolateral region from T10-L2 was then extensively decorticated and packed with the remaining BMP and NexOss along with remaining local autograft. A Medium Hemovac drain was placed through a separate stab incision and anchored with a 2-0 vicryl stitch. The wounds were irrigated and then closed with 1, 2-0 and 3-0 Vicryl stitches. Sterile occlusive dressing was placed with Dermabond. The patient was then extubated in the operating room and taken to recovery in stable and satisfactory condition having tolerated his operation well. Counts were correct at the end of the case.   PLAN OF CARE: Admit to inpatient   PATIENT DISPOSITION:  PACU - hemodynamically stable.   Delay start of  Pharmacological VTE agent (>24hrs) due to surgical blood loss or risk of bleeding: yes

## 2012-09-22 NOTE — Preoperative (Signed)
Beta Blockers   Reason not to administer Beta Blockers:Not Applicable 

## 2012-09-22 NOTE — Interval H&P Note (Signed)
History and Physical Interval Note:  09/22/2012 6:53 AM  Carmen Reynolds  has presented today for surgery, with the diagnosis of Lumbago, Lumbar degenerative disc disease, Lumbar stenosis, Lumbar radiculopathy  The various methods of treatment have been discussed with the patient and family. After consideration of risks, benefits and other options for treatment, the patient has consented to  Procedure(s) with comments: ANTERIOR LATERAL LUMBAR FUSION 4 LEVELS (N/A) - T10 to L2 Anterolateral decompression/fusion/percutaneous pedicle screws C-Arm LUMBAR PERCUTANEOUS PEDICLE SCREW 4 LEVEL (N/A) as a surgical intervention .  The patient's history has been reviewed, patient examined, no change in status, stable for surgery.  I have reviewed the patient's chart and labs.  Questions were answered to the patient's satisfaction.     Carmen Reynolds D

## 2012-09-22 NOTE — Anesthesia Postprocedure Evaluation (Signed)
  Anesthesia Post-op Note  Patient: Carmen Reynolds  Procedure(s) Performed: Procedure(s) with comments: ANTERIOR LATERAL LUMBAR FUSION 4 LEVELS (N/A) - Thoracic Twelve Lumbar One,Lumbar One-TwoAnterolateral decompression/fusion/percutaneous percutaneous pedicle screws  LUMBAR PERCUTANEOUS PEDICLE SCREW 4 LEVEL (N/A) - Pedicle screws Thoracic ten-lumbar two CHEST TUBE INSERTION (Left) - Insertion per Dr. Cyndia Bent  Patient Location: PACU  Anesthesia Type:General  Level of Consciousness: awake  Airway and Oxygen Therapy: Patient Spontanous Breathing  Post-op Pain: moderate  Post-op Assessment: Post-op Vital signs reviewed  Post-op Vital Signs: stable  Complications: No apparent anesthesia complications

## 2012-09-22 NOTE — CV Procedure (Signed)
Called to neuro OR by Dr. Vertell Limber. Patient undergoing minimally invasive spine surgery through left back with entry into the left pleural space. This part of the procedure is completed but she is now going to be turned prone for posterior spine surgery. I inserted a 48 F chest tube in the left pleural space via small incision at anterior axillary line, approx. 7th ICS. Will keep to pleurevac suction through remainder of the case. Expect that this can be removed tomorrow. Will get cxr at the end of the case in PACU.

## 2012-09-22 NOTE — Progress Notes (Signed)
PHARMACIST - PHYSICIAN ORDER COMMUNICATION  CONCERNING: P&T Medication Policy on Herbal Medications  DESCRIPTION:  This patient's order for Red Yeast Rice & co-enzyme Q10 has been noted.  This product(s) is classified as an "herbal" or natural product. Due to a lack of definitive safety studies or FDA approval, nonstandard manufacturing practices, plus the potential risk of unknown drug-drug interactions while on inpatient medications, the Pharmacy and Therapeutics Committee does not permit the use of "herbal" or natural products of this type within [facility].   ACTION TAKEN: The pharmacy department is unable to verify this order at this time and your patient has been informed of this safety policy. Please reevaluate patient's clinical condition at discharge and address if the herbal or natural product(s) should be resumed at that time.   Woodroe Chen, PharmD, BCPS 09/22/2012   5:11 PM

## 2012-09-23 ENCOUNTER — Inpatient Hospital Stay (HOSPITAL_COMMUNITY): Payer: BC Managed Care – PPO

## 2012-09-23 LAB — COMPREHENSIVE METABOLIC PANEL
ALT: 14 U/L (ref 0–35)
AST: 22 U/L (ref 0–37)
Albumin: 2.7 g/dL — ABNORMAL LOW (ref 3.5–5.2)
Alkaline Phosphatase: 33 U/L — ABNORMAL LOW (ref 39–117)
BUN: 21 mg/dL (ref 6–23)
Chloride: 102 mEq/L (ref 96–112)
Potassium: 4.3 mEq/L (ref 3.5–5.1)
Sodium: 136 mEq/L (ref 135–145)
Total Bilirubin: 0.5 mg/dL (ref 0.3–1.2)
Total Protein: 4.7 g/dL — ABNORMAL LOW (ref 6.0–8.3)

## 2012-09-23 LAB — CBC WITH DIFFERENTIAL/PLATELET
Basophils Absolute: 0 10*3/uL (ref 0.0–0.1)
Lymphocytes Relative: 10 % — ABNORMAL LOW (ref 12–46)
Lymphs Abs: 1.5 10*3/uL (ref 0.7–4.0)
Neutro Abs: 11.5 10*3/uL — ABNORMAL HIGH (ref 1.7–7.7)
Platelets: 174 10*3/uL (ref 150–400)
RBC: 2.57 MIL/uL — ABNORMAL LOW (ref 3.87–5.11)
RDW: 14.1 % (ref 11.5–15.5)
WBC: 14.8 10*3/uL — ABNORMAL HIGH (ref 4.0–10.5)

## 2012-09-23 LAB — PREPARE RBC (CROSSMATCH)

## 2012-09-23 MED ORDER — MESALAMINE 400 MG PO CPDR
800.0000 mg | DELAYED_RELEASE_CAPSULE | Freq: Every day | ORAL | Status: DC
  Administered 2012-09-23 – 2012-09-26 (×4): 800 mg via ORAL
  Filled 2012-09-23 (×5): qty 2

## 2012-09-23 MED ORDER — LORATADINE 10 MG PO TABS
10.0000 mg | ORAL_TABLET | Freq: Every day | ORAL | Status: DC
  Filled 2012-09-23: qty 1

## 2012-09-23 MED ORDER — SODIUM CHLORIDE 0.9 % IV BOLUS (SEPSIS)
250.0000 mL | Freq: Once | INTRAVENOUS | Status: AC
  Administered 2012-09-23: 250 mL via INTRAVENOUS

## 2012-09-23 MED ORDER — FEXOFENADINE HCL 180 MG PO TABS
180.0000 mg | ORAL_TABLET | Freq: Every day | ORAL | Status: DC | PRN
  Administered 2012-09-23: 180 mg via ORAL
  Filled 2012-09-23 (×4): qty 1

## 2012-09-23 NOTE — Evaluation (Signed)
Occupational Therapy Evaluation Patient Details Name: Carmen Reynolds MRN: [id number] DOB: 10/10/1950 Today's Date: 09/23/2012 Time: 1660-6301 OT Time Calculation (min): 49 min  OT Assessment / Plan / Recommendation Clinical Impression  Pleasant and cooperative 62 yr old female admitted for 4 level back fusion.  Now with TLSO and increased pain.  She needs increased dependence with basic selfcare tasks and functional activities.  Will benefit from acute care OT to help educate pt and family on back precautions, correct positioning and donning/doffing of her back brace, and use of AE/DME to increase independence.  Anticipate no post acute OT needs.    OT Assessment  Patient needs continued OT Services    Follow Up Recommendations  No OT follow up    Barriers to Discharge None    Equipment Recommendations  None recommended by OT       Frequency  Min 2X/week    Precautions / Restrictions Precautions Precautions: Back Required Braces or Orthoses: Spinal Brace Spinal Brace: Thoracolumbosacral orthotic;Applied in supine position Restrictions Weight Bearing Restrictions: No   Pertinent Vitals/Pain Vitals stable    ADL  Eating/Feeding: Simulated;Independent Where Assessed - Eating/Feeding: Chair Grooming: Simulated;Set up Where Assessed - Grooming: Supported sitting Upper Body Bathing: Simulated;Set up Where Assessed - Upper Body Bathing: Supported sitting Lower Body Bathing: Simulated;Minimal assistance Where Assessed - Lower Body Bathing: Supported sit to stand Upper Body Dressing: Simulated;Set up Where Assessed - Upper Body Dressing: Unsupported sitting Lower Body Dressing: Simulated;Moderate assistance Where Assessed - Lower Body Dressing: Supported sit to stand Toilet Transfer: Pharmacologist Method: Arts development officer: Other (comment) (simulated to bedside chair) Toileting - Clothing Manipulation and Hygiene:  Simulated;Minimal assistance;Other (comment) (sit to stand from EOB) Where Assessed - Toileting Clothing Manipulation and Hygiene: Other (comment) Tub/Shower Transfer Method: Not assessed Equipment Used: Back brace;Rolling walker Transfers/Ambulation Related to ADLs: Pt overall min guard assist for transfers and mobility using back brace and RW. ADL Comments: Pt/husband educated on donning brace in supine, will continue to need practice.  Also discussed back precautions as well as the need for AE to perform LB selfcare.  Pt already has access to a 3:1 and shower seat.    OT Diagnosis: Generalized weakness;Acute pain  OT Problem List: Decreased strength;Decreased activity tolerance;Impaired balance (sitting and/or standing);Pain;Decreased knowledge of precautions OT Treatment Interventions: Self-care/ADL training;Therapeutic activities;Patient/family education;DME and/or AE instruction;Balance training   OT Goals Acute Rehab OT Goals OT Goal Formulation: With patient/family Time For Goal Achievement: 09/30/12 ADL Goals Pt Will Perform Grooming: with modified independence;Standing at sink ADL Goal: Grooming - Progress: Goal set today Pt Will Perform Lower Body Bathing: with supervision;with adaptive equipment;Sit to stand from bed ADL Goal: Lower Body Bathing - Progress: Goal set today Pt Will Perform Lower Body Dressing: with supervision;Sit to stand from bed;with adaptive equipment ADL Goal: Lower Body Dressing - Progress: Goal set today Pt Will Transfer to Toilet: with modified independence;with DME;3-in-1;Maintaining back safety precautions ADL Goal: Toilet Transfer - Progress: Goal set today Pt Will Perform Toileting - Hygiene: Sit to stand from 3-in-1/toilet;with modified independence ADL Goal: Toileting - Hygiene - Progress: Goal set today Miscellaneous OT Goals Miscellaneous OT Goal #1: Pt/family will demonstrate safe assistance with donning/doffing TLSO in supine. OT Goal:  Miscellaneous Goal #1 - Progress: Goal set today  Visit Information  Last OT Received On: 09/23/12 PT/OT Co-Evaluation/Treatment: Yes    Subjective Data  Subjective: Will I have to wear the brace all of the time. Patient Stated Goal: Pt  did not state during session.   Prior Functioning     Home Living Lives With: Spouse Type of Home: House Home Access: Stairs to enter Entrance Stairs-Number of Steps: 3 Home Layout: Two level;Full bath on main level;Able to live on main level with bedroom/bathroom Bathroom Shower/Tub: Multimedia programmer: Standard Home Adaptive Equipment: Walker - rolling;Shower chair with back;Bedside commode/3-in-1 Prior Function Level of Independence: Independent Driving: Yes Vocation: Retired Corporate investment banker: No difficulties Dominant Hand: Right         Vision/Perception Vision - History Baseline Vision: No visual deficits Patient Visual Report: No change from baseline Vision - Assessment Eye Alignment: Within Functional Limits Perception Perception: Within Functional Limits Praxis Praxis: Intact   Cognition  Cognition Overall Cognitive Status: Appears within functional limits for tasks assessed/performed Arousal/Alertness: Awake/alert Orientation Level: Appears intact for tasks assessed Behavior During Session: [facility] for tasks performed    Extremity/Trunk Assessment Right Upper Extremity Assessment RUE ROM/Strength/Tone: Unable to fully assess;Due to precautions;WFL for tasks assessed RUE Sensation: WFL - Light Touch RUE Coordination: WFL - gross/fine motor Left Upper Extremity Assessment LUE ROM/Strength/Tone: WFL for tasks assessed;Unable to fully assess;Due to precautions LUE Sensation: WFL - Light Touch LUE Coordination: WFL - gross/fine motor Trunk Assessment Trunk Assessment: Normal     Mobility Bed Mobility Bed Mobility: Rolling Right;Right Sidelying to Sit Rolling Right: 4: Min assist Right Sidelying  to Sit: 4: Min assist;HOB flat Details for Bed Mobility Assistance: Pt needed mod instructional cueing for bedmobility techniquest to maintain back precautions        Balance Balance Balance Assessed: Yes Dynamic Standing Balance Dynamic Standing - Balance Support: Right upper extremity supported;Left upper extremity supported Dynamic Standing - Level of Assistance: 5: Stand by assistance;Other (comment) (min guard assist)   End of Session OT - End of Session Equipment Utilized During Treatment: Back brace Activity Tolerance: Patient tolerated treatment well Patient left: in chair;with call bell/phone within reach;with family/visitor present Nurse Communication: Mobility status     [place] OTR/L Pager number [phone] 09/23/2012, 1:06 PM

## 2012-09-23 NOTE — Progress Notes (Signed)
09/23/2012 Physical Therapy Evaluation  09/23/12 1300  PT Visit Information  Last PT Received On 09/23/12  Assistance Needed +1  PT/OT Co-Evaluation/Treatment Yes  PT Time Calculation  PT Start Time 1030  PT Stop Time 1122  PT Time Calculation (min) 52 min  Subjective Data  Subjective Pt reports that she is in significant pain and pain pills are not due until 11 am.    Patient Stated Goal to go home, decrease pain  Precautions  Precautions Back  Precaution Booklet Issued Yes (comment) (spinal surgery handout with back precautions)  Required Braces or Orthoses Spinal Brace  Spinal Brace TLSO;Applied in supine position  Restrictions  Weight Bearing Restrictions No  Home Living  Lives With Spouse  Available Help at Discharge Family;Available 24 hours/day  Type of Home House  Home Access Stairs to enter  Entrance Stairs-Number of Steps 3  Entrance Stairs-Rails None  Home Layout Two level;Full bath on main level;Able to live on main level with bedroom/bathroom  Pharmacist, community - rolling;Shower chair with back;Bedside commode/3-in-1  Prior Function  Level of Independence Independent  Able to Take Stairs? Yes  Driving Yes  Vocation Retired  Engineer, petroleum No difficulties  Cognition  Overall Cognitive Status Appears within functional limits for tasks assessed/performed  Arousal/Alertness Awake/alert  Orientation Level Appears intact for tasks assessed  Behavior During Session [facility] for tasks performed  Right Lower Extremity Assessment  RLE ROM/Strength/Tone Deficits  RLE ROM/Strength/Tone Deficits 4/5  RLE Sensation WFL - Light Touch  Left Lower Extremity Assessment  LLE ROM/Strength/Tone Deficits  LLE ROM/Strength/Tone Deficits 4/5  LLE Sensation WFL - Light Touch  Bed Mobility  Bed Mobility Rolling Right;Right Sidelying to Sit  Rolling Right 4: Min assist  Right Sidelying to Sit  4: Min assist;HOB flat  Details for Bed Mobility Assistance Pt needed mod instructional cueing for bedmobility techniquest to maintain back precautions  Transfers  Transfers Sit to Stand;Stand to Sit  Sit to Stand 4: Min guard;From elevated surface;With upper extremity assist;From bed  Stand to Sit 4: Min guard;With upper extremity assist;With armrests;To chair/3-in-1  Details for Transfer Assistance min guard assist to support trunk for balance with first time transitions.    Ambulation/Gait  Ambulation/Gait Assistance 5: Supervision  Ambulation Distance (Feet) 120 Feet  Assistive device Rolling walker  Ambulation/Gait Assistance Details supervision for safety.  , cues for use of RW  Gait Pattern Step-through pattern;Trunk flexed  Balance  Balance Assessed Yes  Static Sitting Balance  Static Sitting - Balance Support Bilateral upper extremity supported;Feet supported  Static Sitting - Level of Assistance 6: Modified independent (Device/Increase time)  Static Sitting - Comment/# of Minutes 8 mins seated EOB for education and pre gait preparation  Static Standing Balance  Static Standing - Balance Support Bilateral upper extremity supported  Static Standing - Level of Assistance 5: Stand by assistance  Static Standing - Comment/# of Minutes with RW supervision for safety.    PT - End of Session  Equipment Utilized During Treatment Back brace  Activity Tolerance Patient limited by fatigue;Patient limited by pain  Patient left in chair;with call bell/phone within reach;with family/visitor present  PT Assessment  Clinical Impression Statement 62 y.o. female admitted [facility] for a second back surgery (first one in '08).  She is now s/p T12-L2 anterior lateral lumbar fusion.  She is moving very well today with RW.  I anticipate that she can go home with husband's supervision  and no HHPT f/u.    PT Recommendation/Assessment Patient needs continued PT services  PT Problem List Decreased activity  tolerance;Decreased balance;Decreased knowledge of use of DME;Decreased knowledge of precautions;Pain  Barriers to Discharge None  Barriers to Discharge Comments None  PT Therapy Diagnosis  Difficulty walking;Abnormality of gait;Generalized weakness;Acute pain  PT Plan  PT Frequency Min 5X/week  PT Treatment/Interventions DME instruction;Gait training;Stair training;Functional mobility training;Therapeutic activities;Therapeutic exercise;Balance training;Neuromuscular re-education;Patient/family education  PT Recommendation  Follow Up Recommendations No PT follow up;Supervision - Intermittent  PT equipment None recommended by PT  Individuals Consulted  Consulted and Agree with Results and Recommendations Patient;Family member/caregiver  Family Member Consulted husband  Acute Rehab PT Goals  PT Goal Formulation With patient/family  Time For Goal Achievement 09/30/12  Potential to Achieve Goals Good  Pt will Roll Supine to Right Side with modified independence  PT Goal: Rolling Supine to Right Side - Progress Goal set today  Pt will Roll Supine to Left Side with modified independence  PT Goal: Rolling Supine to Left Side - Progress Goal set today  Pt will go Supine/Side to Sit with modified independence;with HOB 0 degrees  PT Goal: Supine/Side to Sit - Progress Goal set today  Pt will go Sit to Supine/Side with modified independence;with HOB 0 degrees  PT Goal: Sit to Supine/Side - Progress Goal set today  Pt will go Sit to Stand with modified independence  PT Goal: Sit to Stand - Progress Goal set today  Pt will go Stand to Sit with modified independence  PT Goal: Stand to Sit - Progress Goal set today  Pt will Ambulate >150 feet;with rolling walker;with modified independence  PT Goal: Ambulate - Progress Goal set today  Pt will Go Up / Down Stairs 3-5 stairs;with min assist  PT Goal: Up/Down Stairs - Progress Goal set today  PT General Charges  $$ ACUTE PT VISIT 1 Procedure  PT  Evaluation  $Initial PT Evaluation Tier I 1 Procedure  PT Treatments  $Gait Training 8-22 mins  $Self Care/Home Management 8-22  Written Expression  Dominant Hand Right

## 2012-09-23 NOTE — Progress Notes (Signed)
Met with patient and her husband and reviewed details of surgery.  Mobilizing with PT.  Chest tube out.  OK to tx to 4N.

## 2012-09-23 NOTE — Progress Notes (Signed)
Subjective: Patient reports "Rough...all in my back"  Objective: Vital signs in last 24 hours: Temp:  [97.2 F (36.2 C)-98.9 F (37.2 C)] 98.7 F (37.1 C) (02/28 0810) Pulse Rate:  [73-118] 74 (02/28 0810) Resp:  [4-32] 8 (02/28 0810) BP: (71-118)/(48-87) 116/73 mmHg (02/28 0810) SpO2:  [87 %-100 %] 100 % (02/28 0800) Weight:  [63.8 kg (140 lb 10.5 oz)] 63.8 kg (140 lb 10.5 oz) (02/27 1640)  Intake/Output from previous day: 02/27 0701 - 02/28 0700 In: 3700 [I.V.:3700] Out: 1995 [Urine:1055; Drains:505; Blood:275; Chest Tube:160] Intake/Output this shift: Total I/O In: 500 [I.V.:350; Blood:150] Out: 250 [Urine:200; Drains:50]  Alert, conversant. Reporting back pain; no buttock or leg pain.  No headache. Incisions without erythema, swelling, or drainage. Dermabond intact. Chest tube secure. Good strenghth BLE. Recieving 1unit blood for Hgb 8.2. Normotensive at present.  Lab Results:  Recent Labs  09/22/12 2128 09/23/12 0249  WBC 20.3* 14.8*  HGB 9.2* 8.2*  HCT 28.1* 24.1*  PLT 185 174   BMET  Recent Labs  09/22/12 2010 09/23/12 0249  NA 141 136  K 3.6 4.3  CL 106 102  CO2 26 28  GLUCOSE 135* 129*  BUN 23 21  CREATININE 0.68 0.78  CALCIUM 8.1* 7.8*    Studies/Results: Dg Thoracolumabar Spine  09/22/2012  *RADIOLOGY REPORT*  Clinical Data: Back pain  DG C-ARM 61-120 MIN,THORACOLUMBAR SPINE - 2 VIEW  Comparison: Multiple priors.  Findings: C-arm films document placement of pedicle screws in preparation for T10-L2 fusion.  IMPRESSION: As above.   Original Report Authenticated By: Rolla Flatten, M.D.    Dg Thoracolumabar Spine  09/22/2012  *RADIOLOGY REPORT*  Clinical Data: T12-L2 FUSION.  DG C-ARM 61-120 MIN,THORACOLUMBAR SPINE - 2 VIEW  Comparison: Chest x-ray 09/14/2012  Findings: Two intraoperative spot images demonstrate changes of discectomy at T12-L1 and L1-L2.  Remote hardware noted at L2 posteriorly.  Normal alignment.  IMPRESSION: As above.   Original Report  Authenticated By: Rolm Baptise, M.D.    Dg Chest Port 1 View  09/22/2012  *RADIOLOGY REPORT*  Clinical Data: Status post thoracolumbar fusion.  PORTABLE CHEST - 1 VIEW  Comparison: 09/14/2012.  Findings: Left chest tube good position. There may be a tiny left apical pneumothorax less than 5% (arrows).  Minimal left basilar atelectasis. No visible paraspinous hematoma. Normal cardiac size. New spinous fusion hardware.  IMPRESSION: Cannot exclude tiny left apical pneumothorax less than 5%.  Left chest tube good position.   Original Report Authenticated By: Rolla Flatten, M.D.    Dg C-arm 61-120 Min  09/22/2012  *RADIOLOGY REPORT*  Clinical Data: Back pain  DG C-ARM 61-120 MIN,THORACOLUMBAR SPINE - 2 VIEW  Comparison: Multiple priors.  Findings: C-arm films document placement of pedicle screws in preparation for T10-L2 fusion.  IMPRESSION: As above.   Original Report Authenticated By: Rolla Flatten, M.D.    Dg C-arm [phone] Min  09/22/2012  *RADIOLOGY REPORT*  Clinical Data: T12-L2 FUSION.  DG C-ARM 61-120 MIN,THORACOLUMBAR SPINE - 2 VIEW  Comparison: Chest x-ray 09/14/2012  Findings: Two intraoperative spot images demonstrate changes of discectomy at T12-L1 and L1-L2.  Remote hardware noted at L2 posteriorly.  Normal alignment.  IMPRESSION: As above.   Original Report Authenticated By: Rolm Baptise, M.D.     Assessment/Plan:    LOS: 1 day  Continue supportive care, mobilizing when able. Will recheck H&H after blood.  Appreciate Dr. Vivi Martens assistance.    Verdis Prime 09/23/2012, 8:17 AM

## 2012-09-23 NOTE — Progress Notes (Signed)
Left flank CT removed per MD order per policy.  Patient tolerated well.  3100 RN to get portable c-xray post-removal.

## 2012-09-23 NOTE — Progress Notes (Signed)
                   [address]            [place] [address]          [phone]    1 Day Post-Op Procedure(s) (LRB): ANTERIOR LATERAL LUMBAR FUSION 4 LEVELS (N/A) LUMBAR PERCUTANEOUS PEDICLE SCREW 4 LEVEL (N/A) CHEST TUBE INSERTION (Left)  Subjective: Patient receiving blood this morning.  Objective: Vital signs in last 24 hours: Temp:  [97.2 F (36.2 C)-98.9 F (37.2 C)] 98.7 F (37.1 C) (02/28 0810) Pulse Rate:  [73-118] 74 (02/28 0810) Cardiac Rhythm:  [-] Normal sinus rhythm (02/28 0800) Resp:  [4-32] 8 (02/28 0810) BP: (71-118)/(48-87) 116/73 mmHg (02/28 0810) SpO2:  [87 %-100 %] 100 % (02/28 0800) Weight:  [63.8 kg (140 lb 10.5 oz)] 63.8 kg (140 lb 10.5 oz) (02/27 1640)     Intake/Output from previous day: 02/27 0701 - 02/28 0700 In: 3700 [I.V.:3700] Out: 1995 [Urine:1055; Drains:505; Blood:275; Chest Tube:160]   Physical Exam:  Cardiovascular: RRR Pulmonary: Clear to auscultation bilaterally; no rales, wheezes, or rhonchi. Wounds: Dressing is clean and dry.   Chest Tube: to suction, no air leak  Lab Results: CBC: Recent Labs  09/22/12 2128 09/23/12 0249  WBC 20.3* 14.8*  HGB 9.2* 8.2*  HCT 28.1* 24.1*  PLT 185 174   BMET:  Recent Labs  09/22/12 2010 09/23/12 0249  NA 141 136  K 3.6 4.3  CL 106 102  CO2 26 28  GLUCOSE 135* 129*  BUN 23 21  CREATININE 0.68 0.78  CALCIUM 8.1* 7.8*    PT/INR: No results found for this basename: LABPROT, INR,  in the last 72 hours ABG:  INR: Will add last result for INR, ABG once components are confirmed Will add last 4 CBG results once components are confirmed  Assessment/Plan:  1.Pulmonary - Chest tube with 160 cc of output since surgery. Chest tube is on suction. There is no air leak. As discussed with Dr. Cyndia Bent, remove chest tube. Will obtain stat portable chest x ray after removal. 2.ABL anemia-rceiving blood this am 3.Management per Dr. Tommie Raymond  MPA-C 09/23/2012,8:37 AM

## 2012-09-23 NOTE — Progress Notes (Signed)
Patient doing well. OK to transfer to 4N.

## 2012-09-23 NOTE — Progress Notes (Signed)
Called Dr.Hirsch to inform him of Patient's last blood pressure of 79/54 (59) and patients pain  rating of 8/10. Was advised to give PRN tylenol for pain. No new orders for BP parameters at this time. Will continue to monitor.

## 2012-09-23 NOTE — Progress Notes (Signed)
Hopefully, chest tube out later today.  Mobilize with PT.

## 2012-09-23 NOTE — Progress Notes (Signed)
UR completed 

## 2012-09-23 NOTE — Clinical Social Work Note (Signed)
Clinical Social Worker received referral for possible ST-SNF placement.  Chart reviewed.  PT/OT recommending no further follow up at discharge.     CSW signing off - please re consult if social work needs arise.  Barbette Or, [place]

## 2012-09-23 NOTE — Progress Notes (Signed)
Patient ID: Carmen Reynolds, female   DOB: 09-30-1950, 62 y.o.   MRN: [id number]  Chest tube has been pulled. CXR in progress now. Pt alert & cooperating. MAEW.  Per Dr. Vertell Limber, ok to resume Allegra at pt request; will transfer to 4N and ambulate if CXR results normal. (OK with Lars Pinks, PA from [facility]. Appreciate their assistance.)  Verdis Prime, RN, BSN   Upon revisiting pt; Allegra is not on formulary. Claritin does not work for pt. She has her own Allegra & it is ok for her to use her home med per Dr. Vertell Limber.  TLSO should be donned supine if at all possible. Will add order to that effect.  Verdis Prime, RN, BSN

## 2012-09-24 ENCOUNTER — Inpatient Hospital Stay (HOSPITAL_COMMUNITY): Payer: BC Managed Care – PPO

## 2012-09-24 LAB — TYPE AND SCREEN
Antibody Screen: NEGATIVE
Unit division: 0

## 2012-09-24 NOTE — Progress Notes (Signed)
Occupational Therapy Treatment Patient Details Name: Carmen Reynolds MRN: [id number] DOB: [birth year] Today's Date: 09/24/2012 Time: 2725-3664 OT Time Calculation (min): 35 min  OT Assessment / Plan / Recommendation Comments on Treatment Session pt. tolerated session well. pt./spouse could benefit from con't. practice with donning brace in supine, also requesting mother-in-law for practice on monday. states she arrives tom. night and will be available mon. for tx. session    Follow Up Recommendations  No OT follow up    Barriers to Discharge       Equipment Recommendations  None recommended by OT    Recommendations for Other Services    Frequency Min 2X/week   Plan Discharge plan remains appropriate    Precautions / Restrictions Precautions Precautions: Back Precaution Booklet Issued: Yes (comment) Precaution Comments: Pt unable to recall any of the 3 back precautions.  Re-educated.   Required Braces or Orthoses: Spinal Brace Spinal Brace: Thoracolumbosacral orthotic (applied in supine position) Restrictions Weight Bearing Restrictions: No   Pertinent Vitals/Pain 8/10 per pt.    ADL  Upper Body Dressing: Performed;Minimal assistance (min a gown) Where Assessed - Upper Body Dressing: Unsupported sitting Toilet Transfer: Simulated;Min guard;Other (comment) (stand pivot to straight back chair in room) Toilet Transfer Method: Stand pivot ADL Comments: pt./husband assisted with donning brace in supine, spouse requests additional session on monday for pts. mother-in-law who will be assisting with care giving upon d/c home.      OT Diagnosis:    OT Problem List:   OT Treatment Interventions:     OT Goals ADL Goals ADL Goal: Toilet Transfer - Progress: Progressing toward goals ADL Goal: Toileting - Hygiene - Progress: Progressing toward goals Miscellaneous OT Goals OT Goal: Miscellaneous Goal #1 - Progress: Progressing toward goals  Visit Information  Last OT Received On:  09/24/12 Assistance Needed: +1    Subjective Data  Subjective: "i have been fine going to and from the b.room with out my brace, the doctor said that is okay"   Prior Functioning       Cognition  Cognition Overall Cognitive Status: Appears within functional limits for tasks assessed/performed Arousal/Alertness: Awake/alert Orientation Level: Appears intact for tasks assessed Behavior During Session: [facility] for tasks performed (noted agitation with tubes/lines verbalized a lot during ses)    Mobility  Bed Mobility Bed Mobility: Rolling Right;Rolling Left;Left Sidelying to Sit;Sitting - Scoot to Edge of Bed Rolling Right: With rail;4: Min guard Rolling Left: With rail;4: Min guard Left Sidelying to Sit: 4: Min assist;HOB flat Sitting - Scoot to [employer] of Bed: 4: Min guard Sit to Sidelying Right: 4: Min assist Details for Bed Mobility Assistance: inst. for hand placement on bed rails and cues for sequencing to sit up while maintaining back precautions Transfers Transfers: Sit to Stand;Stand to Sit Sit to Stand: 4: Min guard;From elevated surface;With upper extremity assist;From bed Stand to Sit: 5: Supervision;With upper extremity assist;To chair/3-in-1 Details for Transfer Assistance: Cues for hand placement    Exercises      Balance Static Standing Balance Static Standing - Balance Support: No upper extremity supported Static Standing - Level of Assistance: 5: Stand by assistance;4: Min assist Static Standing - Comment/# of Minutes: Pt able to stand with SBA with eyes open/closed, Min (A) for balance with tandem stance.     End of Session OT - End of Session Equipment Utilized During Treatment: Back brace Activity Tolerance: Patient tolerated treatment well Patient left: in chair;with family/visitor present;with call bell/phone within reach  GO  Janice Coffin 09/24/2012, 11:47 AM

## 2012-09-24 NOTE — Progress Notes (Signed)
Patient ID: Carmen Reynolds, female   DOB: 07/05/1951, 62 y.o.   MRN: [id number] BP 90/50  Pulse 90  Temp(Src) 98.8 F (37.1 C) (Oral)  Resp 16  Ht 5' 5"  (1.651 m)  Wt 63.8 kg (140 lb 10.5 oz)  BMI 23.41 kg/m2  SpO2 96% Alert and oriented x 4 Wound is clean dry and without signs of infection Strength is full in lower extremities Drain removed today.

## 2012-09-24 NOTE — Progress Notes (Signed)
Physical Therapy Treatment Patient Details Name: Carmen Reynolds MRN: [id number] DOB: 1951-05-05 Today's Date: 09/24/2012 Time: 0932-3557 PT Time Calculation (min): 23 min  PT Assessment / Plan / Recommendation Comments on Treatment Session  Pt moving fairly well despite pain.       Follow Up Recommendations  No PT follow up;Supervision - Intermittent     Does the patient have the potential to tolerate intense rehabilitation     Barriers to Discharge        Equipment Recommendations  None recommended by PT    Recommendations for Other Services    Frequency Min 5X/week   Plan Discharge plan remains appropriate    Precautions / Restrictions Precautions Precautions: Back Precaution Booklet Issued: Yes (comment) Precaution Comments: Pt unable to recall any of the 3 back precautions.  Re-educated.   Required Braces or Orthoses: Spinal Brace Spinal Brace: Thoracolumbosacral orthotic (applied in supine position) Restrictions Weight Bearing Restrictions: No       Mobility  Bed Mobility Bed Mobility: Sit to Sidelying Right;Rolling Right;Rolling Left Rolling Right: With rail;4: Min guard Rolling Left: 4: Min guard;With rail Sit to Sidelying Right: 4: Min assist Details for Bed Mobility Assistance: (A) to lift LE's back into bed.  Cues to maintain back precautions.   Transfers Transfers: Sit to Stand;Stand to Sit Sit to Stand: 4: Min guard;With upper extremity assist;With armrests;From bed Stand to Sit: 5: Supervision;With upper extremity assist;To bed Details for Transfer Assistance: Cues for hand placement Ambulation/Gait Ambulation/Gait Assistance: 5: Supervision Ambulation Distance (Feet): 150 Feet Assistive device: Rolling walker Ambulation/Gait Assistance Details: Encouragement to increased step/stride length, & relax.  Pt very stiff/rigid & reports it's due to pain from hips up to neck.   Gait Pattern: Decreased stride length;Decreased step length - right;Decreased step  length - left;Wide base of support;Decreased trunk rotation Stairs: No Wheelchair Mobility Wheelchair Mobility: No      PT Goals Acute Rehab PT Goals Time For Goal Achievement: 09/30/12 Potential to Achieve Goals: Good Pt will Roll Supine to Right Side: with modified independence PT Goal: Rolling Supine to Right Side - Progress: Progressing toward goal Pt will Roll Supine to Left Side: with modified independence PT Goal: Rolling Supine to Left Side - Progress: Progressing toward goal Pt will go Supine/Side to Sit: with modified independence;with HOB 0 degrees Pt will go Sit to Supine/Side: with modified independence;with HOB 0 degrees PT Goal: Sit to Supine/Side - Progress: Progressing toward goal Pt will go Sit to Stand: with modified independence PT Goal: Sit to Stand - Progress: Progressing toward goal Pt will go Stand to Sit: with modified independence PT Goal: Stand to Sit - Progress: Progressing toward goal Pt will Ambulate: >150 feet;with rolling walker;with modified independence PT Goal: Ambulate - Progress: Progressing toward goal Pt will Go Up / Down Stairs: 3-5 stairs;with min assist  Visit Information  Last PT Received On: 09/24/12 Assistance Needed: +1    Subjective Data  Patient Stated Goal: to go home, decrease pain   Cognition  Cognition Overall Cognitive Status: Appears within functional limits for tasks assessed/performed Arousal/Alertness: Awake/alert Orientation Level: Appears intact for tasks assessed Behavior During Session: [facility] for tasks performed    Balance  Static Standing Balance Static Standing - Balance Support: No upper extremity supported Static Standing - Level of Assistance: 5: Stand by assistance;4: Min assist Static Standing - Comment/# of Minutes: Pt able to stand with SBA with eyes open/closed, Min (A) for balance with tandem stance.    End of Session  PT - End of Session Equipment Utilized During Treatment: Back brace Activity  Tolerance: Patient tolerated treatment well;Patient limited by pain Patient left: in bed;with call bell/phone within reach;with family/visitor present Nurse Communication: Mobility status     [name], [place] [phone] 09/24/2012

## 2012-09-24 NOTE — Progress Notes (Addendum)
                    [address]            [place] [address]          [phone]     2 Days Post-Op Procedure(s) (LRB): ANTERIOR LATERAL LUMBAR FUSION 4 LEVELS (N/A) LUMBAR PERCUTANEOUS PEDICLE SCREW 4 LEVEL (N/A) CHEST TUBE INSERTION (Left)  Subjective: Stable, no new issues.  Objective: Vital signs in last 24 hours: Patient Vitals for the past 24 hrs:  BP Temp Temp src Pulse Resp SpO2  09/24/12 1057 106/54 mmHg 97.7 F (36.5 C) - 80 20 -  09/24/12 0905 96/59 mmHg - - 88 - -  09/24/12 0600 90/50 mmHg 98.8 F (37.1 C) - 90 16 96 %  09/24/12 0200 74/44 mmHg 98.9 F (37.2 C) - 89 16 95 %  09/23/12 2200 81/47 mmHg 99.1 F (37.3 C) - 90 16 93 %  09/23/12 1630 101/62 mmHg 99.5 F (37.5 C) - 81 16 96 %  09/23/12 1500 106/60 mmHg - - 94 20 97 %  09/23/12 1400 100/55 mmHg - - 95 16 100 %  09/23/12 1300 109/59 mmHg - - 98 17 98 %  09/23/12 1230 103/59 mmHg 98 F (36.7 C) Oral 80 11 99 %  09/23/12 1215 94/51 mmHg - - 84 15 95 %  09/23/12 1200 88/53 mmHg - - 78 8 98 %   Current Weight  09/22/12 140 lb 10.5 oz (63.8 kg)     Intake/Output from previous day: 02/28 0701 - 03/01 0700 In: 1680 [P.O.:480; I.V.:850; Blood:350] Out: 1160 [Urine:870; Drains:290]    PHYSICAL EXAM:  Heart: RRR Lungs: clear Wound: Clean and dry    Lab Results: CBC: Recent Labs  09/22/12 2128 09/23/12 0249 09/23/12 1220  WBC 20.3* 14.8*  --   HGB 9.2* 8.2* 9.5*  HCT 28.1* 24.1* 27.9*  PLT 185 174  --    BMET:  Recent Labs  09/22/12 2010 09/23/12 0249  NA 141 136  K 3.6 4.3  CL 106 102  CO2 26 28  GLUCOSE 135* 129*  BUN 23 21  CREATININE 0.68 0.78  CALCIUM 8.1* 7.8*    CXR: 09/23/2012 Findings: Left chest tube has been removed. Tiny left apical  pneumothorax is even smaller. Slight new atelectasis at the left  lung base. Right lung is clear. Heart size and vascularity are  normal.  IMPRESSION:  Minimal atelectasis at the left lung base. Very tiny left  apical  pneumothorax.     Assessment/Plan: S/P Procedure(s) (LRB): ANTERIOR LATERAL LUMBAR FUSION 4 LEVELS (N/A) LUMBAR PERCUTANEOUS PEDICLE SCREW 4 LEVEL (N/A) CHEST TUBE INSERTION (Left) Stable from thoracic standpoint.   No CXR done this am. Will order one to follow up tiny apical ptx.   LOS: 2 days    COLLINS,GINA H 09/24/2012  I have seen and examined the patient and agree with the assessment and plan as outlined.    OWEN,CLARENCE H 09/24/2012 12:12 PM

## 2012-09-25 ENCOUNTER — Encounter (HOSPITAL_COMMUNITY): Payer: Self-pay | Admitting: Neurosurgery

## 2012-09-25 MED ORDER — PNEUMOCOCCAL VAC POLYVALENT 25 MCG/0.5ML IJ INJ
0.5000 mL | INJECTION | INTRAMUSCULAR | Status: AC
  Administered 2012-09-26: 0.5 mL via INTRAMUSCULAR
  Filled 2012-09-25: qty 0.5

## 2012-09-25 MED ORDER — POLYETHYLENE GLYCOL 3350 17 G PO PACK
17.0000 g | PACK | Freq: Every day | ORAL | Status: DC
  Administered 2012-09-25 – 2012-09-26 (×2): 17 g via ORAL
  Filled 2012-09-25 (×2): qty 1

## 2012-09-25 MED ORDER — HYDROMORPHONE HCL 2 MG PO TABS
2.0000 mg | ORAL_TABLET | ORAL | Status: DC | PRN
  Administered 2012-09-25: 2 mg via ORAL
  Filled 2012-09-25 (×2): qty 1

## 2012-09-25 MED ORDER — HYDROMORPHONE HCL 2 MG PO TABS
2.0000 mg | ORAL_TABLET | ORAL | Status: DC | PRN
  Administered 2012-09-25: 2 mg via ORAL
  Administered 2012-09-25: 4 mg via ORAL
  Filled 2012-09-25: qty 2

## 2012-09-25 MED ORDER — HYDROMORPHONE HCL 2 MG PO TABS
2.0000 mg | ORAL_TABLET | ORAL | Status: DC | PRN
  Administered 2012-09-25: 4 mg via ORAL
  Administered 2012-09-26: 2 mg via ORAL
  Filled 2012-09-25: qty 2
  Filled 2012-09-25: qty 1

## 2012-09-25 MED ORDER — ESOMEPRAZOLE MAGNESIUM 40 MG PO CPDR
40.0000 mg | DELAYED_RELEASE_CAPSULE | Freq: Every day | ORAL | Status: DC
  Administered 2012-09-25 – 2012-09-26 (×2): 40 mg via ORAL
  Filled 2012-09-25 (×2): qty 1

## 2012-09-25 NOTE — Progress Notes (Signed)
Patient still with significant incisional pain. Pain control compromise somewhat secondary to hypotension exacerbated by narcotics. No new neurologic complaints. Patient with one small bowel movement but still complains of abdominal discomfort.  She is afebrile. Heart rate is mildly elevated. Blood pressure stable with a systolic around 90. No significant orthostatic hypertension however. Wounds all clean and dry. Neurologically with stable motor and sensory function. Abdomen moderately distended with hypoactive bowel sounds.  Progressing reasonably well. Continue efforts at mobilization. Add MiraLAX for constipation.

## 2012-09-25 NOTE — Progress Notes (Signed)
Physical Therapy Treatment Patient Details Name: Carmen Reynolds MRN: [id number] DOB: [birth year] Today's Date: 09/25/2012 Time: 5701-7793 PT Time Calculation (min): 25 min  PT Assessment / Plan / Recommendation Comments on Treatment Session  Pt cont's to c/o significant back pain however she was agreeable to participate in therapy with encouragement.   Pt deferred stair training at this time due pain-- pt aware she needs to practice prior to d/c home.       Follow Up Recommendations  No PT follow up;Supervision - Intermittent     Does the patient have the potential to tolerate intense rehabilitation     Barriers to Discharge        Equipment Recommendations  None recommended by PT    Recommendations for Other Services    Frequency Min 5X/week   Plan Discharge plan remains appropriate    Precautions / Restrictions Precautions Precautions: Back Precaution Comments: Pt required cueing to recall "no arching" Required Braces or Orthoses: Spinal Brace Spinal Brace: Thoracolumbosacral orthotic;Other (comment) (MD has ok'd brace to be donned/doffed sitting EOB per RN.  ) Restrictions Weight Bearing Restrictions: No   Pertinent Vitals/Pain 9/10  Back.  Premedicated.     Mobility  Bed Mobility Bed Mobility: Rolling Right;Rolling Left;Left Sidelying to Sit;Sitting - Scoot to Edge of Bed;Sit to Sidelying Left Rolling Right: 5: Supervision;With rail Rolling Left: 5: Supervision;With rail Left Sidelying to Sit: 4: Min guard;HOB flat;With rails Sitting - Scoot to Edge of Bed: 5: Supervision Sit to Sidelying Left: 4: Min guard;HOB flat Details for Bed Mobility Assistance: Cues to reinforce back precautions (No twisting).  & instructional cues for technique.   Transfers Transfers: Sit to Stand;Stand to Sit Sit to Stand: 5: Supervision;With upper extremity assist;From bed Stand to Sit: 5: Supervision;With upper extremity assist;To bed Ambulation/Gait Ambulation/Gait Assistance: 5:  Supervision Ambulation Distance (Feet): 200 Feet Assistive device: Rolling walker Ambulation/Gait Assistance Details: Cues for increased stride.  Pt still very rigid & guarded with small step length + wide BOS.   Gait Pattern: Step-through pattern;Decreased step length - right;Decreased step length - left;Decreased stride length;Wide base of support;Decreased trunk rotation Stairs: No Wheelchair Mobility Wheelchair Mobility: No      PT Goals Acute Rehab PT Goals Time For Goal Achievement: 09/30/12 Potential to Achieve Goals: Good Pt will Roll Supine to Right Side: with modified independence PT Goal: Rolling Supine to Right Side - Progress: Progressing toward goal Pt will Roll Supine to Left Side: with modified independence PT Goal: Rolling Supine to Left Side - Progress: Progressing toward goal Pt will go Supine/Side to Sit: with modified independence;with HOB 0 degrees PT Goal: Supine/Side to Sit - Progress: Progressing toward goal Pt will go Sit to Supine/Side: with modified independence;with HOB 0 degrees PT Goal: Sit to Supine/Side - Progress: Progressing toward goal Pt will go Sit to Stand: with modified independence PT Goal: Sit to Stand - Progress: Progressing toward goal Pt will go Stand to Sit: with modified independence PT Goal: Stand to Sit - Progress: Progressing toward goal Pt will Ambulate: >150 feet;with rolling walker;with modified independence PT Goal: Ambulate - Progress: Progressing toward goal Pt will Go Up / Down Stairs: 3-5 stairs;with min assist  Visit Information  Last PT Received On: 09/25/12 Assistance Needed: +1    Subjective Data  Subjective: "That brace hurts so bad" Patient Stated Goal: to go home, decrease pain   Cognition  Cognition Overall Cognitive Status: Appears within functional limits for tasks assessed/performed Arousal/Alertness: Awake/alert Orientation Level: Appears intact for  tasks assessed Behavior During Session: [facility] for tasks  performed    Balance     End of Session PT - End of Session Equipment Utilized During Treatment: Gait belt;Back brace Activity Tolerance: Patient tolerated treatment well;Patient limited by pain Patient left: in bed;with call bell/phone within reach;with family/visitor present Nurse Communication: Mobility status     [name], [place] [phone] 09/25/2012

## 2012-09-25 NOTE — Progress Notes (Signed)
Patient getting out of bed without donning her TLSO brace; notified Dr. Annette Stable; orders received may apply in sitting position at bedside; encouraged use for all OOB activity and back precautions; patient c/o soreness and brace is intolerable. Continue to manage pain.  Family member at bedside; states, "We were told she could get to the bathroom without the brace; don't understand donning the brace supine in bed". Reviewed orders for applying the brace for OOB activity.

## 2012-09-25 NOTE — Progress Notes (Signed)
                    [address]            [place] [address]          [phone]     3 Days Post-Op Procedure(s) (LRB): ANTERIOR LATERAL LUMBAR FUSION 4 LEVELS (N/A) LUMBAR PERCUTANEOUS PEDICLE SCREW 4 LEVEL (N/A) CHEST TUBE INSERTION (Left)  Subjective: Still very sore, breathing stable.  Objective: Vital signs in last 24 hours: Patient Vitals for the past 24 hrs:  BP Temp Temp src Pulse Resp SpO2  09/25/12 1009 84/47 mmHg 97.6 F (36.4 C) Oral 100 18 95 %  09/25/12 0502 105/60 mmHg 98.9 F (37.2 C) Oral 90 20 92 %  09/25/12 0100 103/64 mmHg 98 F (36.7 C) Oral 89 20 97 %  09/24/12 2118 90/56 mmHg 98.3 F (36.8 C) Oral 87 20 96 %  09/24/12 1712 105/58 mmHg 98.1 F (36.7 C) - 104 20 100 %   Current Weight  09/22/12 140 lb 10.5 oz (63.8 kg)     Intake/Output from previous day: 03/01 0701 - 03/02 0700 In: 360 [P.O.:360] Out: 4 [Urine:4]    PHYSICAL EXAM:  Heart: RRR Lungs: Clear Chest tube site clean and dry    Lab Results: CBC: Recent Labs  09/22/12 2128 09/23/12 0249 09/23/12 1220  WBC 20.3* 14.8*  --   HGB 9.2* 8.2* 9.5*  HCT 28.1* 24.1* 27.9*  PLT 185 174  --    BMET:  Recent Labs  09/22/12 2010 09/23/12 0249  NA 141 136  K 3.6 4.3  CL 106 102  CO2 26 28  GLUCOSE 135* 129*  BUN 23 21  CREATININE 0.68 0.78  CALCIUM 8.1* 7.8*    PT/INR: No results found for this basename: LABPROT, INR,  in the last 72 hours  CXR: Findings: A tiny residual left apical pneumothorax is possible.  Small left pleural effusion.  Lungs are otherwise clear.  The heart is normal in size.  Lumbar spine fixation hardware.  IMPRESSION:  Tiny residual left apical pneumothorax is possible.  Small left pleural effusion   Assessment/Plan: S/P Procedure(s) (LRB): ANTERIOR LATERAL LUMBAR FUSION 4 LEVELS (N/A) LUMBAR PERCUTANEOUS PEDICLE SCREW 4 LEVEL (N/A) CHEST TUBE INSERTION (Left) Stable from our standpoint. Continue care per NS.   LOS: 3 days    Neira Bentsen H 09/25/2012

## 2012-09-26 MED ORDER — HYDROMORPHONE HCL 2 MG PO TABS: 2.0000 mg | ORAL_TABLET | ORAL | Status: DC | PRN

## 2012-09-26 MED ORDER — DIAZEPAM 5 MG PO TABS: 5.0000 mg | ORAL_TABLET | Freq: Four times a day (QID) | ORAL | Status: DC | PRN

## 2012-09-26 NOTE — Discharge Summary (Signed)
Physician Discharge Summary  Patient ID: TALLYN HOLROYD MRN: [id number] DOB/AGE: 04-18-1951 62 y.o.  Admit date: 09/22/2012 Discharge date: 09/26/2012  Admission Diagnoses:Thoracolumbar scoliosis  Discharge Diagnoses: Same Active Problems:   * No active hospital problems. *   Discharged Condition: good  Hospital Course: Decompression and fusion T10-S1 with ICU observation immediately postop and chest tube, removed POD 1. Required one unit transfusion for postop anemia.  Consults: Thoracic surgery  Significant Diagnostic Studies: Chest X ray, CBC  Treatments: surgery: Decompression and fusion T10-S1  Discharge Exam: Blood pressure 114/67, pulse 86, temperature 97.5 F (36.4 C), temperature source Oral, resp. rate 20, height 5' 5"  (1.651 m), weight 63.8 kg (140 lb 10.5 oz), SpO2 96.00%. Neurologic: Alert and oriented X 3, normal strength and tone. Normal symmetric reflexes. Normal coordination and gait Wound:CDI  Disposition: Home     Medication List    STOP taking these medications       verapamil 240 MG CR tablet  Commonly known as:  CALAN-SR      TAKE these medications       aspirin EC 81 MG tablet  Take 81 mg by mouth daily.     cholecalciferol 1000 UNITS tablet  Commonly known as:  VITAMIN D  Take 2,000 Units by mouth 2 (two) times daily.     CITRUCEL 500 MG Tabs  Generic drug:  Methylcellulose (Laxative)  Take 1,000 mg by mouth 2 (two) times daily.     Co Q 10 100 MG Caps  Take 200 mg by mouth daily. Quol Ultra CoQ10 169m     diazepam 5 MG tablet  Commonly known as:  VALIUM  Take 1 tablet (5 mg total) by mouth every 6 (six) hours as needed.     DULoxetine 60 MG capsule  Commonly known as:  CYMBALTA  Take 60 mg by mouth daily.     esomeprazole 40 MG capsule  Commonly known as:  NEXIUM  Take 40 mg by mouth daily before breakfast.     HYDROcodone-acetaminophen 5-325 MG per tablet  Commonly known as:  NORCO/VICODIN  Take 1-2 tablets by mouth every 6  (six) hours as needed for pain (for pain).     HYDROmorphone 2 MG tablet  Commonly known as:  DILAUDID  Take 1-2 tablets (2-4 mg total) by mouth every 3 (three) hours as needed for pain.     mesalamine 400 MG EC tablet  Commonly known as:  ASACOL  Take 800 mg by mouth daily.     olmesartan 40 MG tablet  Commonly known as:  BENICAR  Take 40 mg by mouth daily.     OVER THE COUNTER MEDICATION  Take 300 mg by mouth daily. Mega red 3043m    OVER THE COUNTER MEDICATION  Take 625 mg by mouth daily. Mega Food Complex C     predniSONE 5 MG tablet  Commonly known as:  DELTASONE  Take 5-20 mg by mouth daily as needed (for inflammation).     Red Yeast Rice Extract 600 MG Tabs  Take 1,200 mg by mouth 2 (two) times daily.         Signed: STPeggyann ShoalsMD 09/26/2012, 9:11 AM

## 2012-09-26 NOTE — Progress Notes (Signed)
Occupational Therapy Treatment and Discharge Patient Details Name: Carmen Reynolds MRN: [id number] DOB: 05-14-1951 Today's Date: 09/26/2012 Time: 0623-7628 OT Time Calculation (min): 43 min  OT Assessment / Plan / Recommendation Comments on Treatment Session Sister participated in entire session as OT educated pt in mobility and ADL adhering to back precautions.    Follow Up Recommendations  No OT follow up    Barriers to Discharge       Equipment Recommendations  None recommended by OT    Recommendations for Other Services    Frequency Min 2X/week   Plan Discharge plan remains appropriate    Precautions / Restrictions Precautions Precautions: Back Precaution Comments: Reviewed back precautions related to ADL. Required Braces or Orthoses: Spinal Brace Spinal Brace: Thoracolumbosacral orthotic;Applied in sitting position Restrictions Weight Bearing Restrictions: No   Pertinent Vitals/Pain Back, did not rate, premedicated, repositioned    ADL  Lower Body Bathing: Supervision/safety (able to cross foot over knee to reach foot, showed long spon) Where Assessed - Lower Body Bathing: Unsupported sitting;Supported sit to stand Lower Body Dressing: Supervision/safety (pt able to cross foot over opposite knee, showed AE) Where Assessed - Lower Body Dressing: Unsupported sitting;Supported sit to stand Toilet Transfer: Supervision/safety Toilet Transfer Method: Sit to Loss adjuster, chartered: Raised toilet seat with arms (or 3-in-1 over toilet) Toileting - Clothing Manipulation and Hygiene: Supervision/safety (educated in use of toilet tongs, alternative technique) Where Assessed - Toileting Clothing Manipulation and Hygiene: Sit to stand from 3-in-1 or toilet Equipment Used: Back brace;Rolling walker;Long-handled shoe horn;Long-handled sponge;Reacher;Sock aid Transfers/Ambulation Related to ADLs: supervision for safety with RW ADL Comments: Educated sister and pt in avoidance of  IADL that require bending, twisting, and/or lifting    OT Diagnosis:    OT Problem List:   OT Treatment Interventions:     OT Goals Acute Rehab OT Goals OT Goal Formulation: With patient/family ADL Goals Pt Will Perform Grooming: with modified independence;Standing at sink ADL Goal: Grooming - Progress: Met Pt Will Perform Lower Body Bathing: with supervision;with adaptive equipment;Sit to stand from bed ADL Goal: Lower Body Bathing - Progress: Met Pt Will Perform Lower Body Dressing: with supervision;Sit to stand from bed;with adaptive equipment ADL Goal: Lower Body Dressing - Progress: Met Pt Will Transfer to Toilet: with modified independence;with DME;3-in-1;Maintaining back safety precautions ADL Goal: Toilet Transfer - Progress: Met Pt Will Perform Toileting - Hygiene: Sit to stand from 3-in-1/toilet;with modified independence ADL Goal: Toileting - Hygiene - Progress: Met Miscellaneous OT Goals Miscellaneous OT Goal #1: Pt/family will demonstrate safe assistance with donning/doffing TLSO in supine.  Visit Information  Last OT Received On: 09/26/12 Assistance Needed: +1    Subjective Data      Prior Functioning       Cognition  Cognition Overall Cognitive Status: Appears within functional limits for tasks assessed/performed Arousal/Alertness: Awake/alert Orientation Level: Appears intact for tasks assessed Behavior During Session: [facility] for tasks performed    Mobility  Bed Mobility Bed Mobility: Not assessed (verbally reviewed log roll technique) Transfers Transfers: Sit to Stand;Stand to Sit Sit to Stand: 6: Modified independent (Device/Increase time);With upper extremity assist;From chair/3-in-1 Stand to Sit: 6: Modified independent (Device/Increase time);To chair/3-in-1;With upper extremity assist Details for Transfer Assistance: Cues to make sure RW in place before standing    Exercises      Balance     End of Session OT - End of Session Activity  Tolerance: Patient tolerated treatment well Patient left: in chair;with call bell/phone within reach;with family/visitor present  GO     Malka So 09/26/2012, 10:04 AM [phone]

## 2012-09-26 NOTE — Progress Notes (Signed)
Subjective: Patient reports feeling better  Objective: Vital signs in last 24 hours: Temp:  [97.5 F (36.4 C)-98.6 F (37 C)] 97.5 F (36.4 C) (03/03 0541) Pulse Rate:  [80-100] 86 (03/03 0541) Resp:  [18-20] 20 (03/03 0541) BP: (84-121)/(47-74) 114/67 mmHg (03/03 0541) SpO2:  [94 %-100 %] 96 % (03/03 0541)  Intake/Output from previous day: 03/02 0701 - 03/03 0700 In: -  Out: 1 [Urine:1] Intake/Output this shift:    Physical Exam: Full strength, dressings CDI  Lab Results:  Recent Labs  09/23/12 1220  HGB 9.5*  HCT 27.9*   BMET No results found for this basename: NA, K, CL, CO2, GLUCOSE, BUN, CREATININE, CALCIUM,  in the last 72 hours  Studies/Results: Dg Chest Port 1 View  09/24/2012  *RADIOLOGY REPORT*  Clinical Data: Left pneumothorax  PORTABLE CHEST - 1 VIEW  Comparison: 09/23/2012.  Findings: A tiny residual left apical pneumothorax is possible. Small left pleural effusion.  Lungs are otherwise clear.  The heart is normal in size.  Lumbar spine fixation hardware.  IMPRESSION: Tiny residual left apical pneumothorax is possible.  Small left pleural effusion.   Original Report Authenticated By: Julian Hy, M.D.     Assessment/Plan: D/C home.  F/U in office in 14 days postop for suture removal and recheck.    LOS: 4 days    Peggyann Shoals, MD 09/26/2012, 7:29 AM

## 2012-09-26 NOTE — Progress Notes (Signed)
Physical Therapy Treatment Patient Details Name: Carmen Reynolds MRN: [id number] DOB: June 29, 1951 Today's Date: 09/26/2012 Time: 6754-4920 PT Time Calculation (min): 29 min  PT Assessment / Plan / Recommendation Comments on Treatment Session  Overall pt moving well.  Noticed increased impulsiveness in today's session as evident in [name], & requiring frequent cueing to reinforce back precautions with mobility.      Follow Up Recommendations  No PT follow up;Supervision - Intermittent     Does the patient have the potential to tolerate intense rehabilitation     Barriers to Discharge        Equipment Recommendations  None recommended by PT    Recommendations for Other Services    Frequency Min 5X/week   Plan Discharge plan remains appropriate    Precautions / Restrictions Precautions Precautions: Back Precaution Comments: Pt verbalizes all 3 back precautions but requires frequent cueing to reinforce back precautions with mobility.   Required Braces or Orthoses: Spinal Brace Spinal Brace: Thoracolumbosacral orthotic;Applied in sitting position Restrictions Weight Bearing Restrictions: No   Pertinent Vitals/Pain C/o 7/10 back.      Mobility  Bed Mobility Bed Mobility: Not assessed Transfers Transfers: Sit to Stand;Stand to Sit Sit to Stand: 6: Modified independent (Device/Increase time);With upper extremity assist;From chair/3-in-1;With armrests Stand to Sit: 6: Modified independent (Device/Increase time);With upper extremity assist;With armrests;To chair/3-in-1 Details for Transfer Assistance: Cues to make sure RW in place before standing Ambulation/Gait Ambulation/Gait Assistance: 5: Supervision Ambulation Distance (Feet): 200 Feet Assistive device: Rolling walker Ambulation/Gait Assistance Details: Cues to increase stride length, & reinforcement of "no twisting" with distractions.   Gait  Pattern: Step-through pattern;Decreased stride length;Decreased trunk rotation Stairs: Yes Stairs Assistance: 5: Supervision Stairs Assistance Details (indicate cue type and reason): cues to slow down to ensure safety.  Pt slightly impulsive & went up/down stairs quickly.   Pt braced hands against rails to mimic surfaces at home she can support herself against.   Stair Management Technique: Two rails;Alternating pattern;Forwards Number of Stairs: 4 Wheelchair Mobility Wheelchair Mobility: No      PT Goals Acute Rehab PT Goals Time For Goal Achievement: 09/30/12 Potential to Achieve Goals: Good Pt will Roll Supine to Right Side: with modified independence Pt will Roll Supine to Left Side: with modified independence Pt will go Supine/Side to Sit: with modified independence;with HOB 0 degrees Pt will go Sit to Supine/Side: with modified independence;with HOB 0 degrees Pt will go Sit to Stand: with modified independence PT Goal: Sit to Stand - Progress: Met Pt will go Stand to Sit: with modified independence PT Goal: Stand to Sit - Progress: Met Pt will Ambulate: >150 feet;with rolling walker;with modified independence PT Goal: Ambulate - Progress: Progressing toward goal Pt will Go Up / Down Stairs: 3-5 stairs;with min assist PT Goal: Up/Down Stairs - Progress: Met  Visit Information  Last PT Received On: 09/26/12 Assistance Needed: +1    Subjective Data      Cognition  Cognition Overall Cognitive Status: Appears within functional limits for tasks assessed/performed Arousal/Alertness: Awake/alert Orientation Level: Appears intact for tasks assessed Behavior During Session: [name] for tasks performed    Balance     End of Session PT - End of Session Equipment Utilized During Treatment: Gait belt;Back brace Activity Tolerance: Patient tolerated treatment well Patient left: in chair;with call bell/phone within reach;with family/visitor present;Other (comment) (MD present) Nurse  Communication: Mobility status  [name], [place] [phone] 09/26/2012

## 2012-09-26 NOTE — Care Management Note (Signed)
    Page 1 of 1   09/26/2012     1:34:00 PM   CARE MANAGEMENT NOTE 09/26/2012  Patient:  Carmen Reynolds, Carmen Reynolds   Account Number:  [id number]  Date Initiated:  09/26/2012  Documentation initiated by:  [facility]  Subjective/Objective Assessment:   admitted postop decompression and fusion T10-S1     Action/Plan:   PT/OT evals-no follow up recommended  patient has a rooling walker and BSC   Anticipated DC Date:  09/26/2012   Anticipated DC Plan:  [place]  CM consult      Choice offered to / List presented to:             Status of service:  Completed, signed off Medicare Important Message given?   (If response is "NO", the following Medicare IM given date fields will be blank) Date Medicare IM given:   Date Additional Medicare IM given:    Discharge Disposition:  HOME/SELF CARE  Per UR Regulation:  Reviewed for med. necessity/level of care/duration of stay  If discussed at [place] of Stay Meetings, dates discussed:    Comments:

## 2012-11-08 ENCOUNTER — Encounter: Payer: Self-pay | Admitting: Cardiovascular Disease

## 2012-12-13 ENCOUNTER — Encounter: Payer: Self-pay | Admitting: Cardiovascular Disease

## 2013-02-07 ENCOUNTER — Ambulatory Visit (INDEPENDENT_AMBULATORY_CARE_PROVIDER_SITE_OTHER): Payer: BC Managed Care – PPO | Admitting: Family Medicine

## 2013-02-07 VITALS — BP 145/90 | Ht 65.0 in | Wt 135.0 lb

## 2013-02-07 DIAGNOSIS — M25572 Pain in left ankle and joints of left foot: Secondary | ICD-10-CM

## 2013-02-07 DIAGNOSIS — M2021 Hallux rigidus, right foot: Secondary | ICD-10-CM

## 2013-02-07 DIAGNOSIS — M25579 Pain in unspecified ankle and joints of unspecified foot: Secondary | ICD-10-CM

## 2013-02-07 DIAGNOSIS — M2020 Hallux rigidus, unspecified foot: Secondary | ICD-10-CM

## 2013-02-07 NOTE — Patient Instructions (Signed)
Today we put a 1st ray post in right shoe and a lateral heel wedge on left foot. Go to the shoe store tomorrow to see if you can get a wider shoe. Try Aspercream on right big toe. Please call us if your foot pain is worse with the modifications.

## 2013-02-08 NOTE — Progress Notes (Signed)
CC: Left lateral foot pain and right great toe pain HPI: Patient is a very pleasant 62 year old female who presents with left foot and right great toe pain. Patient states she has always had some difficulty with her feet as she has very high arches and supinates a lot. She states that her pain initially started about 3 weeks ago when she changed her shoes. She will usually walks 40 minutes on a treadmill every day. However, after changing her shoes, she got pain at the lateral foot near the base of the fifth metatarsal as well as pain in the body of the arch. She has previously required custom orthotics I cannot find them right now. Currently she is using a good over-the-counter orthotic. Now, since her pain started, she has been unable to go back to her old shoes without pain.  Patient is also concerned about right great toe pain. She places a lot of weight on her metatarsals because of her high arch and feels like she needs a lot of conditioning. She has noticed that she sometimes feels a lot of pain in her right great toe and MTP joint, especially since she has started compensating for her left foot pain.  ROS: As above in the HPI. All other systems are stable or negative.  OBJECTIVE: APPEARANCE:  Patient in no acute distress.The patient appeared well nourished and normally developed. HEENT: No scleral icterus. Conjunctiva non-injected Respirations nonlabored MSK: Patient is noticed to have extreme pes cavus. When she stands and walks barefoot, she has extreme supination on the left and mild supination on the right. When walking in her shoes with insoles she has correction of the supination on the right but continues to have severe supination on the left. Patient has tenderness to palpation over the left base of the fifth metatarsal where she has rubbing from her shoe as well as in the body of the mid arch. No other bony tenderness to palpation. Negative metatarsal squeeze test. Good ankle range of  motion on the left with good strength in all directions. Good mobility of the left MTP joint in flexion and extension. Examination of the right great toe shows hallux rigidus. Patient has loss of both flexion and extension with some hypertrophy of the MTP joint. She has some tenderness at the joint line on exam.  ASSESSMENT:  #1. Peds cavus with supination #2. Left lateral foot pain secondary to pressure on base of fifth metatarsal as a result of supination #3. Hallux rigidus of right great toe   PLAN: - Left lateral foot pain: This is secondary to extreme supination and pressure of the base of the fifth metatarsal as a bony prominence. Initially, a lateral rate post was attempted but did not adequately her supination. Therefore, this was removed and a left lateral heel wedge was placed in the interval. This gave moderate correction the patient does still have supination on exam. Suspect that on return visit she will need custom orthotics. We have asked her to go back to the shoe store to see if she could get a wide appearing shoes to give her more room in the toe box. She will call associates worsening of her pain after these modifications. - Right hallux rigidus. A first ray post was placed in the patient's insole. She had improvement and comfort after this modification. She is also encouraged to try topicals like Aspercreme.

## 2013-02-14 ENCOUNTER — Ambulatory Visit (INDEPENDENT_AMBULATORY_CARE_PROVIDER_SITE_OTHER): Payer: BC Managed Care – PPO | Admitting: Sports Medicine

## 2013-02-14 VITALS — BP 124/70 | Ht 65.0 in | Wt 135.0 lb

## 2013-02-14 DIAGNOSIS — M25579 Pain in unspecified ankle and joints of unspecified foot: Secondary | ICD-10-CM

## 2013-02-14 DIAGNOSIS — M2021 Hallux rigidus, right foot: Secondary | ICD-10-CM

## 2013-02-14 DIAGNOSIS — M25572 Pain in left ankle and joints of left foot: Secondary | ICD-10-CM

## 2013-02-14 DIAGNOSIS — M2020 Hallux rigidus, unspecified foot: Secondary | ICD-10-CM

## 2013-02-14 NOTE — Progress Notes (Signed)
  Subjective:    Patient ID: Carmen Reynolds, female    DOB: [birth year], 62 y.o.   MRN: [id number]  HPI Patient comes in today with persistent lateral left foot pain. She was last seen in the office one week ago. Her exam at that time showed severe supination, left greater than right as well as hallux rigidus of the right great toe. Our recommendations were for her to try to find a shoe with a wider toe box for her fifth metatarsal pain and we also added a lateral heel which to her left foot to help correct her supination. Also add a first ray post on the right. Unfortunately she continues to have pain and I asked that she come in today so that both Dr. Oneida Alar and myself can evaluate her further. She did get a shoe with a wider toe box but did not notice any improvement in her symptoms.    Review of Systems     Objective:   Physical Exam  Well-developed, well-nourished. No acute distress. Awake alert and oriented x3  Patient has a markedly cavus foot bilaterally. She has collapse of the transverse arch is, left greater than right and has tremendous supination with walking, again left greater than right. She has tremendous passive supination on the left with very rigid passive eversion. There is a prominent left lateral malleolus when examining the foot from behind suggesting ankle subluxation. There is hallux rigidus on the right but nearly full range of motion at the first MTP joint on the left. She is tender to palpation at the base of the left fifth metatarsal. No soft tissue swelling. No erythema. Patient has significant hip abductor weakness on the left compared to the right.  MSK ultrasound: Long and short images of the lateral left foot were obtained. The base of the fifth metatarsal has increased neovascularity consistent with a stress reaction here. The peroneal brevis tendon demonstrates a small split just distal to the peroneal tubercle. There are hypoechoic changes at the insertion of the  peroneal brevis onto the base of the fifth metatarsal. Ultrasound evaluation of the right great toe shows significant dorsal spurring and increased neovascularity.        Assessment & Plan:  1. Cavus foot 2. Supination 3. Hallux rigidus, right great toe 4. Left ankle subluxation  This patient has a very challenging foot and gait. We are going to try a body helix X. strap to help provide some stability for her left ankle which definitely wobbles as she walks due to her subluxation. We are also going to have her start doing some hip abduction strengthening exercises. The off-the-shelf orthotic is actually well constructed and we will add a lateral post to the left foot in addition to the lateral heel wedge to help correct her supination. We also extended the first ray post on the right orthotic. Evaluation of her gait afterwards did show some improvement in her supination on the left but it was not a full correction. This patient will eventually benefit from custom orthotics and we will have her return to the office in 4 weeks for reevaluation.

## 2013-03-07 ENCOUNTER — Ambulatory Visit: Payer: BC Managed Care – PPO | Admitting: Family Medicine

## 2013-03-08 ENCOUNTER — Ambulatory Visit (INDEPENDENT_AMBULATORY_CARE_PROVIDER_SITE_OTHER): Payer: BC Managed Care – PPO | Admitting: Sports Medicine

## 2013-03-08 VITALS — BP 138/70 | Ht 65.0 in | Wt 135.0 lb

## 2013-03-08 DIAGNOSIS — M25579 Pain in unspecified ankle and joints of unspecified foot: Secondary | ICD-10-CM

## 2013-03-08 DIAGNOSIS — M25572 Pain in left ankle and joints of left foot: Secondary | ICD-10-CM

## 2013-03-08 DIAGNOSIS — R269 Unspecified abnormalities of gait and mobility: Secondary | ICD-10-CM | POA: Insufficient documentation

## 2013-03-08 NOTE — Assessment & Plan Note (Signed)
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 brace for left ankle  She has good sports insoles that we modified today with a large lateral 3/4 length wedge on the left and a fifth ray post  On the right we did a fifth ray post and a short heel wedge  Both of these corrections improve her gait but it is not clear that they will be adequate  She will test these x 1 month but if not improved we may need to make a custom orthotic with some major corrections for her foot position

## 2013-03-08 NOTE — Assessment & Plan Note (Signed)
The pain is somewhat less  She will need to use moleskin to keep her from blistering when she uses the body helix X. brace

## 2013-03-08 NOTE — Progress Notes (Signed)
Patient ID: Carmen Reynolds, female   DOB: 1950-10-23, 62 y.o.   MRN: [id number]  Patient returns for evaluation of her abnormal gait and pain in her foot and ankle. What we noted last was significant supination bilaterally but on the left foot she was subluxing the ankle because of the extreme supination.  She is now able to walk on the treadmill again. The body helix X. brace helps her ankle but cause a blister in the posterior area.  The lateral heel wedge that we posted on her left foot and the lateral fifth right posterior helpful but did not feel that they are enough.  Physical examination  No acute distress  Left ankle is subluxed Standing reveals extreme supination Walking gait reveals that even with correction she is markedly supinated and she is starting to get some lateral breakdown  Right foot is also supinated and needs more correction

## 2013-04-11 ENCOUNTER — Ambulatory Visit (INDEPENDENT_AMBULATORY_CARE_PROVIDER_SITE_OTHER): Payer: BC Managed Care – PPO | Admitting: Sports Medicine

## 2013-04-11 ENCOUNTER — Encounter: Payer: Self-pay | Admitting: Sports Medicine

## 2013-04-11 VITALS — BP 155/89 | HR 84 | Ht 65.0 in | Wt 135.0 lb

## 2013-04-11 DIAGNOSIS — R269 Unspecified abnormalities of gait and mobility: Secondary | ICD-10-CM

## 2013-04-11 DIAGNOSIS — M25572 Pain in left ankle and joints of left foot: Secondary | ICD-10-CM

## 2013-04-11 DIAGNOSIS — M25579 Pain in unspecified ankle and joints of unspecified foot: Secondary | ICD-10-CM

## 2013-04-11 NOTE — Progress Notes (Signed)
  Subjective:    Patient ID: Carmen Reynolds, female    DOB: 13-Jan-1951, 62 y.o.   MRN: [id number]  HPI  62 year old F return for follow up of ankle pain and gait abnormality. She has severe cavus deformity of left foot leading to severe supination and subluxation of left fibular head and severe pain with ambulation. She was evaluated on 03/08/13 and her sport insoles were modified with a large lateral 3/4 length wedge on the left and a fifth ray post and on the right a fifth ray post and a short heel wedge was placed. She has also be using body helix brace for her left ankle. Today, the patient notes that the pain is not improved. It started to worsen after wearing the modified sports orthotics for one week.   The wedge feels too firm The amount of lift takes her out of shoe    Review of Systems     Objective:   Physical Exam BP 155/89  Pulse 84  Ht 5' 5"  (1.651 m)  Wt 135 lb (61.236 kg)  BMI 22.47 kg/m2  Gen: elderly WF, mild distress but non ill appearing  Left foot with severe cavus, breakdown of transverse arch and hammer toes #2-5 Standing reveals extreme supination left and moderate supination RT Walking gait reveals that even with correction she is markedly supinated and she is starting to get some lateral breakdown with subluxation of left fibula     Assessment & Plan:

## 2013-04-11 NOTE — Assessment & Plan Note (Addendum)
Today we made custom orthotics with lateral correction to stop left sided inversions with lateral subluxation by using lateral posting and custom wedge on the left. The right orthotic insole was made in a neutral position.

## 2013-04-11 NOTE — Assessment & Plan Note (Signed)
Patient was fitted for a : standard, cushioned, semi-rigid orthotic. The orthotic was heated and afterward the patient stood on the orthotic blank positioned on the orthotic stand. The patient was positioned in subtalar neutral position and 10 degrees of ankle dorsiflexion in a weight bearing stance. After completion of molding, a stable base was applied to the orthotic blank. The blank was ground to a stable position for weight bearing. Size: 8 red EVA Base:Blue EVA Posting: we did a lateral to medial taper on both orthotics at bases but also shifted to flattened arch on left Additional orthotic padding: lateral heel post foam rubber  Time 50 minutes  Try these x 2 mos and then recheck  If she fails she may need to see foot and ankle surgeon

## 2013-04-25 ENCOUNTER — Encounter: Payer: Self-pay | Admitting: Cardiovascular Disease

## 2013-04-25 ENCOUNTER — Ambulatory Visit (INDEPENDENT_AMBULATORY_CARE_PROVIDER_SITE_OTHER): Payer: BC Managed Care – PPO | Admitting: Cardiovascular Disease

## 2013-04-25 VITALS — BP 140/90 | HR 87 | Ht 65.0 in | Wt 146.0 lb

## 2013-04-25 DIAGNOSIS — R079 Chest pain, unspecified: Secondary | ICD-10-CM

## 2013-04-25 DIAGNOSIS — Z8249 Family history of ischemic heart disease and other diseases of the circulatory system: Secondary | ICD-10-CM

## 2013-04-25 DIAGNOSIS — I10 Essential (primary) hypertension: Secondary | ICD-10-CM

## 2013-04-25 NOTE — Patient Instructions (Signed)
Your physician recommends that you schedule a follow-up appointment in:  4-6 weeks.   Your physician has requested that you have an echocardiogram. Echocardiography is a painless test that uses sound waves to create images of your heart. It provides your doctor with information about the size and shape of your heart and how well your heart's chambers and valves are working. This procedure takes approximately one hour. There are no restrictions for this procedure.  Your physician has requested that you have a lexiscan myoview. For further information please visit [email or web address]. Please follow instruction sheet, as given.

## 2013-04-25 NOTE — Progress Notes (Signed)
History of Present Illness: 62 yo female with history of HTN, GERD, Factor 5 Leiden, fibromyalgia who is here today to establish cardiology care. She has no history of cardiac disease. She has a strong family history of heart disease and stroke. She denies SOB, palpitations, near syncope or syncope. She does not e occasional chest pains. She recently sustained a severe ankle injury and is wearing a boot. She thinks she may need to have an MRI and possibly surgery.   Primary Care Physician: Melford Aase  Past Medical History  Diagnosis Date  . History of fusion of cervical spine t  . Fusion of spine of lumbar region t  . Lactose intolerance   . Adult celiac disease   . Fibromyalgia   . Hypertension   . GERD (gastroesophageal reflux disease)   . Proctitis   . Ulcerative colitis   . Diverticulitis   . Factor V Leiden   . Concussion     fell jumping off of horse in past 6 months- 09/14/12    Past Surgical History  Procedure Laterality Date  . Tubal ligation  1983  . Foot surgery Right 1978    Reconstruction after faliure of emergency stitching  . Neck surgery  2002    anterior cervical fusion  . Back surgery  2006, 2008    . disectomy  . Lasik    . Eye surgery    . Anterior lateral lumbar fusion 4 levels N/A 09/22/2012    Procedure: ANTERIOR LATERAL LUMBAR FUSION 4 LEVELS;  Surgeon: Erline Levine, MD;  Location: [facility];  Service: Neurosurgery;  Laterality: N/A;  Thoracic Twelve Lumbar One,Lumbar One-TwoAnterolateral decompression/fusion/percutaneous percutaneous pedicle screws   . Chest tube insertion Left 09/22/2012    Procedure: CHEST TUBE INSERTION;  Surgeon: Erline Levine, MD;  Location: [facility];  Service: Neurosurgery;  Laterality: Left;  Insertion per Dr. Cyndia Bent    Current Outpatient Prescriptions  Medication Sig Dispense Refill  . aspirin EC 81 MG tablet Take 81 mg by mouth daily.      . cholecalciferol (VITAMIN D) 1000 UNITS tablet Take 2,000 Units by mouth 2 (two)  times daily.      . Coenzyme Q10 (CO Q 10) 100 MG CAPS Take 200 mg by mouth daily. Quol Ultra CoQ10 170m      . DULoxetine (CYMBALTA) 60 MG capsule Take 60 mg by mouth daily.      .Marland Kitchenesomeprazole (NEXIUM) 40 MG capsule Take 40 mg by mouth daily before breakfast.      . fexofenadine (ALLEGRA) 180 MG tablet Take 180 mg by mouth as needed.      .Marland KitchenHYDROcodone-acetaminophen (NORCO/VICODIN) 5-325 MG per tablet Take 1-2 tablets by mouth every 6 (six) hours as needed for pain (for pain).      . Loperamide HCl (IMODIUM PO) Take by mouth as needed.      . meloxicam (MOBIC) 15 MG tablet Take 1 tablet by mouth as directed.      . mesalamine (ASACOL) 400 MG EC tablet Take 800 mg by mouth daily.      . Methylcellulose, Laxative, (CITRUCEL) 500 MG TABS Take 1,000 mg by mouth 2 (two) times daily.      .Marland Kitchenolmesartan (BENICAR) 40 MG tablet Take 40 mg by mouth daily.      .Marland KitchenOVER THE COUNTER MEDICATION Take 300 mg by mouth daily. Mega red 3018m     . OVER THE COUNTER MEDICATION Take 625 mg by mouth daily. Mega Food  Complex C      . predniSONE (DELTASONE) 5 MG tablet Take 5-20 mg by mouth daily as needed (for inflammation).      . Red Yeast Rice Extract 600 MG TABS Take 1,200 mg by mouth 2 (two) times daily.      . traMADol (ULTRAM) 50 MG tablet Take 1 tablet by mouth as directed.      . verapamil (VERELAN PM) 240 MG 24 hr capsule Take 240 mg by mouth daily.       No current facility-administered medications for this visit.    Allergies  Allergen Reactions  . Lyrica [Pregabalin] Swelling    Swelling of feet.  . Lactose Intolerance (Gi)   . Other     Salt-sensitivity Grain-sensitivity Artificial sweetners-sensitivity Sugar-sensitivity  . Wheat Bran     Wheat-sensitivity    History   Social History  . Marital Status: Married    Spouse Name: N/A    Number of Children: 1  . Years of Education: N/A   Occupational History  . Unemployed-Coordinator for Projects    Social History Main Topics  .  Smoking status: Former Smoker -- 1.00 packs/day for 24 years    Types: Cigarettes    Quit date: 07/28/1990  . Smokeless tobacco: Not on file  . Alcohol Use: 4.2 oz/week    7 Glasses of wine per week  . Drug Use: No  . Sexual Activity: Not on file   Other Topics Concern  . Not on file   Social History Narrative  . No narrative on file    Family History  Problem Relation Age of Onset  . Prostate cancer Father   . Heart attack Brother 44    CABG  . Heart attack Brother 21  . Heart failure Mother   . Stroke Mother     Review of Systems:  As stated in the HPI and otherwise negative.   BP 140/90  Pulse 87  Ht 5' 5"  (1.651 m)  Wt 146 lb (66.225 kg)  BMI 24.3 kg/m2  Physical Examination: General: Well developed, well nourished, NAD HEENT: OP clear, mucus membranes moist SKIN: warm, dry. No rashes. Neuro: No focal deficits Musculoskeletal: Muscle strength 5/5 all ext Psychiatric: Mood and affect normal Neck: No JVD, no carotid bruits, no thyromegaly, no lymphadenopathy. Lungs:Clear bilaterally, no wheezes, rhonci, crackles Cardiovascular: Regular rate and rhythm. No murmurs, gallops or rubs. Abdomen:Soft. Bowel sounds present. Non-tender.  Extremities: No lower extremity edema. Pulses are 2 + in the bilateral DP/PT.  EKG: NSR, rate 87 bpm.   Assessment and Plan:   1. Chest pain: She has a strong family history of CAD. She has personal risk factors for CAD including HTN, former tobacco abuse. Will arrange Lexiscan stress myoview (she cannot walk with ankle injury) to exclude ischemia and echo to exclude structural heart disease.   2. HTN: managed in primary care.

## 2013-05-10 ENCOUNTER — Encounter (HOSPITAL_COMMUNITY): Payer: BC Managed Care – PPO

## 2013-05-17 ENCOUNTER — Other Ambulatory Visit (HOSPITAL_COMMUNITY): Payer: BC Managed Care – PPO

## 2013-05-26 ENCOUNTER — Encounter: Payer: Self-pay | Admitting: Internal Medicine

## 2013-05-29 ENCOUNTER — Ambulatory Visit (HOSPITAL_COMMUNITY): Payer: BC Managed Care – PPO | Attending: Cardiology | Admitting: Radiology

## 2013-05-29 VITALS — BP 139/79 | HR 71 | Ht 65.0 in | Wt 142.0 lb

## 2013-05-29 DIAGNOSIS — R0989 Other specified symptoms and signs involving the circulatory and respiratory systems: Secondary | ICD-10-CM | POA: Insufficient documentation

## 2013-05-29 DIAGNOSIS — R0609 Other forms of dyspnea: Secondary | ICD-10-CM | POA: Insufficient documentation

## 2013-05-29 DIAGNOSIS — R0602 Shortness of breath: Secondary | ICD-10-CM

## 2013-05-29 DIAGNOSIS — R002 Palpitations: Secondary | ICD-10-CM | POA: Insufficient documentation

## 2013-05-29 DIAGNOSIS — Z87891 Personal history of nicotine dependence: Secondary | ICD-10-CM | POA: Insufficient documentation

## 2013-05-29 DIAGNOSIS — R0789 Other chest pain: Secondary | ICD-10-CM | POA: Insufficient documentation

## 2013-05-29 DIAGNOSIS — R51 Headache: Secondary | ICD-10-CM

## 2013-05-29 DIAGNOSIS — R079 Chest pain, unspecified: Secondary | ICD-10-CM

## 2013-05-29 DIAGNOSIS — Z8249 Family history of ischemic heart disease and other diseases of the circulatory system: Secondary | ICD-10-CM

## 2013-05-29 DIAGNOSIS — I10 Essential (primary) hypertension: Secondary | ICD-10-CM | POA: Insufficient documentation

## 2013-05-29 MED ORDER — TECHNETIUM TC 99M SESTAMIBI GENERIC - CARDIOLITE
33.0000 | Freq: Once | INTRAVENOUS | Status: AC | PRN
  Administered 2013-05-29: 33 via INTRAVENOUS

## 2013-05-29 MED ORDER — REGADENOSON 0.4 MG/5ML IV SOLN
0.4000 mg | Freq: Once | INTRAVENOUS | Status: AC
  Administered 2013-05-29: 0.4 mg via INTRAVENOUS

## 2013-05-29 MED ORDER — TECHNETIUM TC 99M SESTAMIBI GENERIC - CARDIOLITE
11.0000 | Freq: Once | INTRAVENOUS | Status: AC | PRN
  Administered 2013-05-29: 11 via INTRAVENOUS

## 2013-05-29 MED ORDER — AMINOPHYLLINE 25 MG/ML IV SOLN
75.0000 mg | Freq: Once | INTRAVENOUS | Status: AC
  Administered 2013-05-29: 75 mg via INTRAVENOUS

## 2013-05-29 NOTE — Progress Notes (Signed)
[facility] [address] [place], [place] [address] [phone]    Cardiology Nuclear Med Study  Carmen Reynolds is a 62 y.o. female     MRN : [id number]     DOB: 1951-01-30  Procedure Date: 05/29/2013  Nuclear Med Background Indication for Stress Test:  Evaluation for Ischemia History:  No previously documented CAD. Cardiac Risk Factors: Family History - CAD, History of Smoking and Hypertension  Symptoms:  Chest Pressure.  (last episode of chest discomfort was about a month ago), DOE and Palpitations   Nuclear Pre-Procedure Caffeine/Decaff Intake:  None NPO After: 7:30pm   Lungs:  Clear. O2 Sat: 98% on room air. IV 0.9% NS with Angio Cath:  22g  IV Site: R Forearm  IV Started by:  Matilde Haymaker, RN  Chest Size (in):  38 Cup Size: D  Height: 5' 5"  (1.651 m)  Weight:  142 lb (64.411 kg)  BMI:  Body mass index is 23.63 kg/(m^2). Tech Comments:  Patient took Vicodin at [facility] for foot pain. Patient vasovagal with IV stick: diaphoretic,dizzy,BP 116/80,pulse 56,O2 sat 100%. Patient reclined and symptoms relieved.    Nuclear Med Study 1 or 2 day study: 1 day  Stress Test Type:  Carlton Adam  Reading MD: Kirk Ruths, MD  Order Authorizing Provider:  Lauree Chandler, MD  Resting Radionuclide: Technetium 74mSestamibi  Resting Radionuclide Dose: 10.6 mCi   Stress Radionuclide:  Technetium 986mestamibi  Stress Radionuclide Dose: 33.0 mCi           Stress Protocol Rest HR: 71 Stress HR: 110  Rest BP: 139/79 Stress BP: 143/80  Exercise Time (min): n/a METS: n/a   Predicted Max HR: 156 bpm % Max HR: 70.51 bpm Rate Pressure Product: 15730  Dose of Adenosine (mg):  n/a Dose of Lexiscan: 0.4 mg Dose of Aminophylline:75 mg  Dose of Atropine (mg): n/a Dose of Dobutamine: n/a mcg/kg/min (at max HR)  Stress Test Technologist: ShLetta MoynahanCMA-N  Nuclear Technologist:  WeJennelle HumanCNMT     Rest Procedure:  Myocardial perfusion imaging was  performed at rest 45 minutes following the intravenous administration of Technetium 9963mstamibi.  Rest ECG: NSR, RAD.  Stress Procedure:  The patient received IV Lexiscan 0.4 mg over 15-seconds.  Technetium 53m36mtamibi injected at 30-seconds.  She c/o dyspnea, stomach cramps and head pressure that was relieved with Aminophylline.  Quantitative spect images were obtained after a 45 minute delay.  Stress ECG: No significant ST segment change suggestive of ischemia.  QPS Raw Data Images:  Acquisition technically good; normal left ventricular size. Stress Images:  There is decreased uptake in the apex. Rest Images:  Normal homogeneous uptake in all areas of the myocardium. Subtraction (SDS):  These findings are consistent with probable apical thinning; cannot R/O very mild apical ischemia. Transient Ischemic Dilatation (Normal <1.22):  1.08 Lung/Heart Ratio (Normal <0.45):  0.21  Quantitative Gated Spect Images QGS EDV:  87 ml QGS ESV:  34 ml  Impression Exercise Capacity:  Lexiscan with no exercise. BP Response:  Normal blood pressure response. Clinical Symptoms:  There is dyspnea. ECG Impression:  No significant ST segment change suggestive of ischemia. Comparison with Prior Nuclear Study: No previous nuclear study performed  Overall Impression:  Low risk stress nuclear study with a small, mild intensity, reversible apical defect most likely related to apical thinning; cannot R/O mild apical ischemia.  LV Ejection Fraction: 61%.  LV Wall Motion:  NL LV Function; NL  [place]

## 2013-05-30 ENCOUNTER — Ambulatory Visit (HOSPITAL_COMMUNITY): Payer: BC Managed Care – PPO | Attending: Cardiovascular Disease

## 2013-05-30 DIAGNOSIS — Z8249 Family history of ischemic heart disease and other diseases of the circulatory system: Secondary | ICD-10-CM | POA: Insufficient documentation

## 2013-05-30 DIAGNOSIS — R079 Chest pain, unspecified: Secondary | ICD-10-CM

## 2013-05-30 DIAGNOSIS — I10 Essential (primary) hypertension: Secondary | ICD-10-CM | POA: Insufficient documentation

## 2013-05-30 DIAGNOSIS — I079 Rheumatic tricuspid valve disease, unspecified: Secondary | ICD-10-CM | POA: Insufficient documentation

## 2013-05-30 NOTE — Progress Notes (Signed)
Echocardiogram performed.  

## 2013-06-16 ENCOUNTER — Ambulatory Visit (INDEPENDENT_AMBULATORY_CARE_PROVIDER_SITE_OTHER): Payer: BC Managed Care – PPO | Admitting: Cardiovascular Disease

## 2013-06-16 ENCOUNTER — Encounter: Payer: Self-pay | Admitting: Cardiovascular Disease

## 2013-06-16 VITALS — BP 122/68 | HR 80 | Resp 12 | Wt 143.0 lb

## 2013-06-16 DIAGNOSIS — Z01810 Encounter for preprocedural cardiovascular examination: Secondary | ICD-10-CM

## 2013-06-16 DIAGNOSIS — R079 Chest pain, unspecified: Secondary | ICD-10-CM

## 2013-06-16 NOTE — Patient Instructions (Signed)
Your physician recommends that you schedule a follow-up appointment  As needed with Dr. Angelena Form

## 2013-06-16 NOTE — Progress Notes (Signed)
History of Present Illness: 62 yo female with history of HTN, GERD, Factor 5 Leiden, fibromyalgia who is here today for cardiac follow up. I saw her as a new pt 04/25/13 to establish cardiology care. She has no history of cardiac disease. She has a strong family history of heart disease and stroke. She denied SOB, palpitations, near syncope or syncope. She did note occasional chest pains. She recently sustained a severe ankle injury and is wearing a boot. She has plans for surgery next week. I arranged a stress test and an echo. Stress myoview 05/29/13 with no ischemia. There was apical thinning which is likely artifact. Echo 05/30/13 with normal LV size and function, no significant valve issues.   She is here today for follow up. She tells me that she   Primary Care Physician: Carmen Reynolds: Carmen Reynolds  Past Medical History  Diagnosis Date  . History of fusion of cervical spine t  . Fusion of spine of lumbar region t  . Lactose intolerance   . Adult celiac disease   . Fibromyalgia   . Hypertension   . GERD (gastroesophageal reflux disease)   . Proctitis   . Ulcerative colitis   . Diverticulitis   . Factor V Leiden   . Concussion     fell jumping off of horse in past 6 months- 09/14/12    Past Surgical History  Procedure Laterality Date  . Tubal ligation  1983  . Foot surgery Right 1978    Reconstruction after faliure of emergency stitching  . Neck surgery  2002    anterior cervical fusion  . Back surgery  2006, 2008    . disectomy  . Lasik    . Eye surgery    . Anterior lateral lumbar fusion 4 levels N/A 09/22/2012    Procedure: ANTERIOR LATERAL LUMBAR FUSION 4 LEVELS;  Surgeon: Erline Levine, MD;  Location: [facility];  Service: Neurosurgery;  Laterality: N/A;  Thoracic Twelve Lumbar One,Lumbar One-TwoAnterolateral decompression/fusion/percutaneous percutaneous pedicle screws   . Chest tube insertion Left 09/22/2012    Procedure: CHEST TUBE INSERTION;  Surgeon: Erline Levine,  MD;  Location: [facility];  Service: Neurosurgery;  Laterality: Left;  Insertion per Dr. Cyndia Bent    Current Outpatient Prescriptions  Medication Sig Dispense Refill  . aspirin EC 81 MG tablet Take 81 mg by mouth daily.      . cholecalciferol (VITAMIN D) 1000 UNITS tablet Take 2,000 Units by mouth 2 (two) times daily.      . Coenzyme Q10 (CO Q 10) 100 MG CAPS Take 200 mg by mouth daily. Quol Ultra CoQ10 153m      . DULoxetine (CYMBALTA) 60 MG capsule Take 60 mg by mouth daily.      .Marland Kitchenesomeprazole (NEXIUM) 40 MG capsule Take 40 mg by mouth daily before breakfast.      . fexofenadine (ALLEGRA) 180 MG tablet Take 180 mg by mouth as needed.      .Marland KitchenHYDROcodone-acetaminophen (NORCO/VICODIN) 5-325 MG per tablet Take 1-2 tablets by mouth every 6 (six) hours as needed for pain (for pain).      . Loperamide HCl (IMODIUM PO) Take by mouth as needed.      . meloxicam (MOBIC) 15 MG tablet Take 1 tablet by mouth as directed.      . mesalamine (ASACOL) 400 MG EC tablet Take 800 mg by mouth daily.      . Methylcellulose, Laxative, (CITRUCEL) 500 MG TABS Take 1,000 mg by mouth 2 (  two) times daily.      [name] olmesartan (BENICAR) 40 MG tablet Take 40 mg by mouth daily.      [name] OVER THE COUNTER MEDICATION Take 300 mg by mouth daily. Mega red 345m      . OVER THE COUNTER MEDICATION Take 625 mg by mouth daily. Mega Food Complex C      . predniSONE (DELTASONE) 5 MG tablet Take 5-20 mg by mouth daily as needed (for inflammation).      . Red Yeast Rice Extract 600 MG TABS Take 1,200 mg by mouth 2 (two) times daily.      . traMADol (ULTRAM) 50 MG tablet Take 1 tablet by mouth as directed.      . verapamil (VERELAN PM) 240 MG 24 hr capsule Take 240 mg by mouth daily.       No current facility-administered medications for this visit.    Allergies  Allergen Reactions  . Lyrica [Pregabalin] Swelling    Swelling of feet.  . Augmentin [Amoxicillin-Pot Clavulanate] Diarrhea  . Lactose Intolerance (Gi)   . Meloxicam  Other (See Comments)    Intestinal Bleed  . Other     Salt-sensitivity Grain-sensitivity Artificial sweetners-sensitivity Sugar-sensitivity  . Wheat Bran     Wheat-sensitivity    History   Social History  . Marital Status: Married    Spouse Name: N/A    Number of Children: 1  . Years of Education: N/A   Occupational History  . Unemployed-Coordinator for Projects    Social History Main Topics  . Smoking status: Former Smoker -- 1.00 packs/day for 24 years    Types: Cigarettes    Quit date: 07/28/1990  . Smokeless tobacco: Not on file  . Alcohol Use: 4.2 oz/week    7 Glasses of wine per week  . Drug Use: No  . Sexual Activity: Not on file   Other Topics Concern  . Not on file   Social History Narrative  . No narrative on file    Family History  Problem Relation Age of Onset  . Prostate cancer Father   . Heart attack Brother 634   CABG  . Heart attack Brother 669 . Heart failure Mother   . Stroke Mother     Review of Systems:  As stated in the HPI and otherwise negative.   BP 122/68  Pulse 80  Resp 12  Physical Examination: General: Well developed, well nourished, NAD HEENT: OP clear, mucus membranes moist SKIN: warm, dry. No rashes. Neuro: No focal deficits Musculoskeletal: Muscle strength 5/5 all ext Psychiatric: Mood and affect normal Neck: No JVD, no carotid bruits, no thyromegaly, no lymphadenopathy. Lungs:Clear bilaterally, no wheezes, rhonci, crackles Cardiovascular: Regular rate and rhythm. No murmurs, gallops or rubs. Abdomen:Soft. Bowel sounds present. Non-tender.  Extremities: No lower extremity edema. Pulses are 2 + in the bilateral DP/PT.  Lexiscan stress myoview 05/29/13: Stress Procedure: The patient received IV Lexiscan 0.4 mg over 15-seconds. Technetium 979mestamibi injected at 30-seconds. She c/o dyspnea, stomach cramps and head pressure that was relieved with Aminophylline. Quantitative spect images were obtained after a 45 minute  delay.  Stress ECG: No significant ST segment change suggestive of ischemia.  QPS  Raw Data Images: Acquisition technically good; normal left ventricular size.  Stress Images: There is decreased uptake in the apex.  Rest Images: Normal homogeneous uptake in all areas of the myocardium.  Subtraction (SDS): These findings are consistent with probable apical thinning; cannot R/O very mild apical ischemia.  Transient Ischemic Dilatation (Normal <1.22): 1.08  Lung/Heart Ratio (Normal <0.45): 0.21  Quantitative Gated Spect Images  QGS EDV: 87 ml  QGS ESV: 34 ml  Impression  Exercise Capacity: Lexiscan with no exercise.  BP Response: Normal blood pressure response.  Clinical Symptoms: There is dyspnea.  ECG Impression: No significant ST segment change suggestive of ischemia.  Comparison with Prior Nuclear Study: No previous nuclear study performed  Overall Impression: Low risk stress nuclear study with a small, mild intensity, reversible apical defect most likely related to apical thinning; cannot R/O mild apical ischemia.  LV Ejection Fraction: 61%. LV Wall Motion: NL LV Function; NL Wall Motion  Echo 05/30/13: Left ventricle: The cavity size was normal. There was mild concentric hypertrophy. Systolic function was normal. The estimated ejection fraction was in the range of 55% to 60%. - Atrial septum: No defect or patent foramen ovale was identified.  Assessment and Plan:   1. Chest pain: Does not appear to be cardiac. Stress myoview without ischemia. Echo with normal LV function. No further ischemic workup at this time.    2. Pre-operative cardiovascular examination: No further ischemic evaluation necessary. Proceed with surgery.

## 2013-07-17 ENCOUNTER — Other Ambulatory Visit: Payer: Self-pay | Admitting: Internal Medicine

## 2013-07-17 DIAGNOSIS — G894 Chronic pain syndrome: Secondary | ICD-10-CM

## 2013-07-17 MED ORDER — HYDROCODONE-ACETAMINOPHEN 5-325 MG PO TABS: ORAL_TABLET | ORAL | Status: DC

## 2013-09-14 ENCOUNTER — Other Ambulatory Visit: Payer: Self-pay | Admitting: Gastroenterology

## 2013-09-14 DIAGNOSIS — R1013 Epigastric pain: Secondary | ICD-10-CM

## 2013-09-28 ENCOUNTER — Other Ambulatory Visit: Payer: Self-pay | Admitting: Internal Medicine

## 2013-10-04 ENCOUNTER — Ambulatory Visit (HOSPITAL_COMMUNITY)
Admission: RE | Admit: 2013-10-04 | Discharge: 2013-10-04 | Disposition: A | Discharge status: Home / Self Care | Payer: BC Managed Care – PPO | Source: Ambulatory Visit | Attending: Gastroenterology | Admitting: Gastroenterology

## 2013-10-04 DIAGNOSIS — R1013 Epigastric pain: Secondary | ICD-10-CM | POA: Insufficient documentation

## 2013-10-04 MED ORDER — STERILE WATER FOR INJECTION IJ SOLN
INTRAMUSCULAR | Status: AC
  Administered 2013-10-04: 5 mL
  Filled 2013-10-04: qty 10

## 2013-10-04 MED ORDER — SINCALIDE 5 MCG IJ SOLR
INTRAMUSCULAR | Status: AC
  Administered 2013-10-04: 1.28 ug via INTRAVENOUS
  Filled 2013-10-04: qty 5

## 2013-10-04 MED ORDER — TECHNETIUM TC 99M MEBROFENIN IV KIT
5.0000 | PACK | Freq: Once | INTRAVENOUS | Status: AC | PRN
  Administered 2013-10-04: 5 via INTRAVENOUS

## 2013-10-04 MED ORDER — SINCALIDE 5 MCG IJ SOLR: 0.0200 ug/kg | Freq: Once | INTRAMUSCULAR | Status: AC

## 2013-10-06 ENCOUNTER — Encounter (INDEPENDENT_AMBULATORY_CARE_PROVIDER_SITE_OTHER): Payer: Self-pay | Admitting: Surgery

## 2013-10-10 ENCOUNTER — Ambulatory Visit (INDEPENDENT_AMBULATORY_CARE_PROVIDER_SITE_OTHER): Payer: BC Managed Care – PPO | Admitting: Internal Medicine

## 2013-10-10 ENCOUNTER — Encounter: Payer: Self-pay | Admitting: Internal Medicine

## 2013-10-10 VITALS — BP 130/74 | HR 84 | Temp 97.5°F | Resp 18 | Ht 64.5 in | Wt 143.0 lb

## 2013-10-10 DIAGNOSIS — R7309 Other abnormal glucose: Secondary | ICD-10-CM

## 2013-10-10 DIAGNOSIS — E559 Vitamin D deficiency, unspecified: Secondary | ICD-10-CM | POA: Insufficient documentation

## 2013-10-10 DIAGNOSIS — Z79899 Other long term (current) drug therapy: Secondary | ICD-10-CM

## 2013-10-10 DIAGNOSIS — E782 Mixed hyperlipidemia: Secondary | ICD-10-CM

## 2013-10-10 DIAGNOSIS — I10 Essential (primary) hypertension: Secondary | ICD-10-CM

## 2013-10-10 LAB — CBC WITH DIFFERENTIAL/PLATELET
Basophils Absolute: 0 10*3/uL (ref 0.0–0.1)
Basophils Relative: 0 % (ref 0–1)
EOS PCT: 1 % (ref 0–5)
Eosinophils Absolute: 0.1 10*3/uL (ref 0.0–0.7)
HEMATOCRIT: 37.2 % (ref 36.0–46.0)
HEMOGLOBIN: 12.6 g/dL (ref 12.0–15.0)
LYMPHS ABS: 3 10*3/uL (ref 0.7–4.0)
LYMPHS PCT: 33 % (ref 12–46)
MCH: 29.8 pg (ref 26.0–34.0)
MCHC: 33.9 g/dL (ref 30.0–36.0)
MCV: 87.9 fL (ref 78.0–100.0)
MONO ABS: 1.4 10*3/uL — AB (ref 0.1–1.0)
Monocytes Relative: 15 % — ABNORMAL HIGH (ref 3–12)
Neutro Abs: 4.7 10*3/uL (ref 1.7–7.7)
Neutrophils Relative %: 51 % (ref 43–77)
Platelets: 331 10*3/uL (ref 150–400)
RBC: 4.23 MIL/uL (ref 3.87–5.11)
RDW: 13.8 % (ref 11.5–15.5)
WBC: 9.2 10*3/uL (ref 4.0–10.5)

## 2013-10-10 NOTE — Progress Notes (Signed)
Patient ID: Carmen Reynolds, female   DOB: 17-Jun-1951, 63 y.o.   MRN: [id number]    This very nice 63 y.o. MWF presents for 3 month follow up with Hypertension, Hyperlipidemia, DDD/Chronic Lumbago, Pre-Diabetes and Vitamin D Deficiency. Patient was recently referred by Dr Collene Mares to Dr Ninfa Linden  10/23/2013 for elective Lap Chole.   HTN predates since 1997. BP has been controlled at home. Today's BP: 130/74 mmHg . Patient denies any cardiac type chest pain, palpitations, dyspnea/orthopnea/PND, dizziness, claudication, or dependent edema.   Hyperlipidemia is controlled with diet & red yeast rice extract. Last Cholesterol was 245, Triglycerides were    103, HDL 131 and LDL 93 in Oct 2014 - at goal. Patient denies myalgias or other med SE's.    Also, the patient has history of PreDiabetes with A1c 5.7% in 2013 and with last A1c of 5.5% in Oct 2014. Patient denies any symptoms of reactive hypoglycemia, diabetic polys, paresthesias or visual blurring.   Further, Patient has history of Vitamin D Deficiency of 28 in 2008 with last vitamin D of 92 in Oct 2014. Patient supplements vitamin D without any suspected side-effects.  Medication Sig  . cholecalciferol (VITAMIN D) 1000 UNITS tablet Take 2,000 Units by mouth 2 (two) times daily.  . Coenzyme Q10 (CO Q 10) 100 MG CAPS Take 200 mg by mouth daily. Quol Ultra CoQ10 165m  . DULoxetine (CYMBALTA) 60 MG capsule TAKE 1 CAPSULE DAILY FOR   MOOD  . esomeprazole (NEXIUM) 40 MG capsule Take 40 mg by mouth daily before breakfast.  . ESTRING 2 MG vaginal ring   . fexofenadine (ALLEGRA) 180 MG tablet Take 180 mg by mouth as needed.  .Marland KitchenHYDROcodone-acetaminophen (NORCO) 5-325 MG per tablet 1/2 to 1 tablet 3 - 4 x daily for severe pain  . Loperamide HCl (IMODIUM PO) Take by mouth as needed.  . mesalamine (ASACOL) 400 MG EC tablet Take 800 mg by mouth daily.  . Methylcellulose, Laxative, (CITRUCEL) 500 MG TABS Take 1,000 mg by mouth 2 (two) times daily.  .Marland Kitchenolmesartan  (BENICAR) 40 MG tablet Take 40 mg by mouth daily.  .Marland KitchenOVER THE COUNTER MEDICATION Take 300 mg by mouth daily. Mega red 3023m . OVER THE COUNTER MEDICATION Take 625 mg by mouth daily. Mega Food Complex C  . predniSONE (DELTASONE) 5 MG tablet Take 5-20 mg by mouth daily as needed (for inflammation).  . Red Yeast Rice Extract 600 MG TABS Take 1,200 mg by mouth 2 (two) times daily.  . verapamil (VERELAN PM) 240 MG 24 hr capsule TAKE 1 CAPSULE DAILY     Allergies  Allergen Reactions  . Lyrica [Pregabalin] Swelling    Swelling of feet.  . Augmentin [Amoxicillin-Pot Clavulanate] Diarrhea  . Lactose Intolerance (Gi)   . Meloxicam Other (See Comments)    Intestinal Bleed  . Other     Salt-sensitivity Grain-sensitivity Artificial sweetners-sensitivity Sugar-sensitivity  . Wheat Bran     Wheat-sensitivity    PMHx:   Past Medical History  Diagnosis Date  . History of fusion of cervical spine t  . Fusion of spine of lumbar region t  . Lactose intolerance   . Adult celiac disease   . Fibromyalgia   . Hypertension   . GERD (gastroesophageal reflux disease)   . Proctitis   . Ulcerative colitis   . Diverticulitis   . Factor V Leiden Heterozygous (APR 2013)   . Concussion     fell jumping off of horse in past 6  months- 09/14/12    FHx:    Reviewed / unchanged  SHx:    Reviewed / unchanged   Systems Review: Constitutional: Denies fever, chills, wt changes, headaches, insomnia, fatigue, night sweats, change in appetite. Eyes: Denies redness, blurred vision, diplopia, discharge, itchy, watery eyes.  ENT: Denies discharge, congestion, post nasal drip, epistaxis, sore throat, earache, hearing loss, dental pain, tinnitus, vertigo, sinus pain, snoring.  CV: Denies chest pain, palpitations, irregular heartbeat, syncope, dyspnea, diaphoresis, orthopnea, PND, claudication, edema. Respiratory: denies cough, dyspnea, DOE, pleurisy, hoarseness, laryngitis, wheezing.  Gastrointestinal: Denies  dysphagia, odynophagia, heartburn, reflux, water brash, abdominal pain or cramps, nausea, vomiting, bloating, diarrhea, constipation, hematemesis, melena, hematochezia,  or hemorrhoids. Genitourinary: Denies dysuria, frequency, urgency, nocturia, hesitancy, discharge, hematuria, flank pain. Musculoskeletal: Denies arthralgias, myalgias, stiffness, jt. swelling, pain, limp, strain/sprain.  Skin: Denies pruritus, rash, hives, warts, acne, eczema, change in skin lesion(s). Neuro: No weakness, tremor, incoordination, spasms, paresthesia, or pain. Psychiatric: Denies confusion, memory loss, or sensory loss. Endo: Denies change in weight, skin, hair change.  Heme/Lymph: No excessive bleeding, bruising, orenlarged lymph nodes.  Exam:  BP 130/74  Pulse 84  Temp 97.5 F Resp 18  Ht 5' 4.5"   Wt 143 lb   BMI 24.18 kg/m2  Appears well nourished - in no distress. Eyes: PERRLA, EOMs, conjunctiva no swelling or erythema. Sinuses: No frontal/maxillary tenderness ENT/Mouth: EAC's clear, TM's nl w/o erythema, bulging. Nares clear w/o erythema, swelling, exudates. Oropharynx clear without erythema or exudates. Oral hygiene is good. Tongue normal, non obstructing. Hearing intact.  Neck: Supple. Thyroid nl. Car 2+/2+ without bruits, nodes or JVD. Chest: Respirations nl with BS clear & equal w/o rales, rhonchi, wheezing or stridor.  Cor: Heart sounds normal w/ regular rate and rhythm without sig. murmurs, gallops, clicks, or rubs. Peripheral pulses normal and equal  without edema.  Abdomen: Soft & bowel sounds normal. Non-tender w/o guarding, rebound, hernias, masses, or organomegaly.  Lymphatics: Unremarkable.  Musculoskeletal: Full ROM all peripheral extremities, joint stability, 5/5 strength, and normal gait.  Skin: Warm, dry without exposed rashes, lesions, ecchymosis apparent.  Neuro: Cranial nerves intact, reflexes equal bilaterally. Sensory-motor testing grossly intact. Tendon reflexes grossly  intact.  Pysch: Alert & oriented x 3. Insight and judgement nl & appropriate. No ideations.  Assessment and Plan:  1. Hypertension - Continue monitor blood pressure at home. Continue diet/meds same.  2. Hyperlipidemia - Continue diet/meds, exercise,& lifestyle modifications. Continue monitor periodic cholesterol/liver & renal functions   3. Pre-diabetes/Insulin Resistance - Continue diet, exercise, lifestyle modifications. Monitor appropriate labs.  3. DDD/DJD/Spinal Stenosis/Scoliosis- Chronic Lumbago  4. Vitamin D Deficiency - Continue supplementation.  Recommended regular exercise, BP monitoring, weight control, and discussed med and SE's. Recommended labs to assess and monitor clinical status. Further disposition pending results of labs.

## 2013-10-10 NOTE — Patient Instructions (Signed)

## 2013-10-11 LAB — BASIC METABOLIC PANEL WITH GFR
BUN: 12 mg/dL (ref 6–23)
CHLORIDE: 96 meq/L (ref 96–112)
CO2: 30 meq/L (ref 19–32)
Calcium: 10.2 mg/dL (ref 8.4–10.5)
Creat: 0.69 mg/dL (ref 0.50–1.10)
GFR, Est African American: >89 mL/min
GFR, Est Non African American: >89 mL/min
Glucose, Bld: 88 mg/dL (ref 70–99)
Potassium: 4 mEq/L (ref 3.5–5.3)
SODIUM: 134 meq/L — AB (ref 135–145)

## 2013-10-11 LAB — LIPID PANEL
CHOL/HDL RATIO: 2 ratio
Cholesterol: 226 mg/dL — ABNORMAL HIGH (ref 0–200)
HDL: 111 mg/dL (ref >39)
LDL Cholesterol: 84 mg/dL (ref 0–99)
Triglycerides: 153 mg/dL — ABNORMAL HIGH (ref <150)
VLDL: 31 mg/dL (ref 0–40)

## 2013-10-11 LAB — HEPATIC FUNCTION PANEL
ALBUMIN: 4.8 g/dL (ref 3.5–5.2)
ALK PHOS: 83 U/L (ref 39–117)
ALT: 17 U/L (ref 0–35)
AST: 22 U/L (ref 0–37)
BILIRUBIN TOTAL: 0.5 mg/dL (ref 0.2–1.2)
Bilirubin, Direct: 0.1 mg/dL (ref 0.0–0.3)
Indirect Bilirubin: 0.4 mg/dL (ref 0.2–1.2)
Total Protein: 6.8 g/dL (ref 6.0–8.3)

## 2013-10-11 LAB — URINALYSIS, MICROSCOPIC ONLY
BACTERIA UA: NONE SEEN
CASTS: NONE SEEN
Crystals: NONE SEEN

## 2013-10-11 LAB — TSH: TSH: 2.479 u[IU]/mL (ref 0.350–4.500)

## 2013-10-11 LAB — VITAMIN D 25 HYDROXY (VIT D DEFICIENCY, FRACTURES): VIT D 25 HYDROXY: 105 ng/mL — AB (ref 30–89)

## 2013-10-11 LAB — INSULIN, FASTING: Insulin fasting, serum: 4 u[IU]/mL (ref 3–28)

## 2013-10-11 LAB — MAGNESIUM: MAGNESIUM: 1.7 mg/dL (ref 1.5–2.5)

## 2013-10-11 LAB — HEMOGLOBIN A1C
HEMOGLOBIN A1C: 5.9 % — AB (ref <5.7)
Mean Plasma Glucose: 123 mg/dL — ABNORMAL HIGH (ref <117)

## 2013-10-23 ENCOUNTER — Ambulatory Visit (INDEPENDENT_AMBULATORY_CARE_PROVIDER_SITE_OTHER): Payer: BC Managed Care – PPO | Admitting: Surgery

## 2013-10-23 ENCOUNTER — Encounter (INDEPENDENT_AMBULATORY_CARE_PROVIDER_SITE_OTHER): Payer: Self-pay | Admitting: Surgery

## 2013-10-23 VITALS — BP 126/74 | HR 74 | Temp 98.5°F | Ht 65.0 in | Wt 139.0 lb

## 2013-10-23 DIAGNOSIS — K811 Chronic cholecystitis: Secondary | ICD-10-CM | POA: Insufficient documentation

## 2013-10-23 MED ORDER — ENOXAPARIN SODIUM 150 MG/ML ~~LOC~~ SOLN: 30.0000 mg | SUBCUTANEOUS | Status: DC

## 2013-10-23 NOTE — Progress Notes (Signed)
Patient ID: Carmen Reynolds, female   DOB: 10/23/1950, 63 y.o.   MRN: [id number]  Chief Complaint  Patient presents with  . eval gallbladder    new pt    HPI [name] is a 63 y.o. female.   HPI This is a pleasant female referred to me by Dr. Collene Mares for evaluation of abdominal pain. She has been having sharp epigastric abdominal pain hurting through to her back and right upper quadrant for several months. She has no nausea or vomiting. The pain is moderate to severe. It is intermittent. It is worse with fatty meals. It is different from her discomfort from her ulcerative colitis. Bowel movements are loose Past Medical History  Diagnosis Date  . History of fusion of cervical spine t  . Fusion of spine of lumbar region t  . Lactose intolerance   . Adult celiac disease   . Fibromyalgia   . Hypertension   . GERD (gastroesophageal reflux disease)   . Proctitis   . Ulcerative colitis   . Diverticulitis   . Factor V Leiden   . Concussion     fell jumping off of horse in past 6 months- 09/14/12    Past Surgical History  Procedure Laterality Date  . Tubal ligation  1983  . Foot surgery Right 1978    Reconstruction after faliure of emergency stitching  . Neck surgery  2002    anterior cervical fusion  . Back surgery  2006, 2008    . disectomy  . Lasik    . Eye surgery    . Anterior lateral lumbar fusion 4 levels N/A 09/22/2012    Procedure: ANTERIOR LATERAL LUMBAR FUSION 4 LEVELS;  Surgeon: Erline Levine, MD;  Location: [facility];  Service: Neurosurgery;  Laterality: N/A;  Thoracic Twelve Lumbar One,Lumbar One-TwoAnterolateral decompression/fusion/percutaneous percutaneous pedicle screws   . Chest tube insertion Left 09/22/2012    Procedure: CHEST TUBE INSERTION;  Surgeon: Erline Levine, MD;  Location: [facility];  Service: Neurosurgery;  Laterality: Left;  Insertion per Dr. Cyndia Bent    Family History  Problem Relation Age of Onset  . Prostate cancer Father   . Heart attack Brother  61    CABG  . Heart attack Brother 59  . Heart failure Mother   . Stroke Mother     Social History History  Substance Use Topics  . Smoking status: Former Smoker -- 1.00 packs/day for 24 years    Types: Cigarettes    Quit date: 07/28/1990  . Smokeless tobacco: Not on file  . Alcohol Use: 4.2 oz/week    7 Glasses of wine per week    Allergies  Allergen Reactions  . Lyrica [Pregabalin] Swelling    Swelling of feet.  . Augmentin [Amoxicillin-Pot Clavulanate] Diarrhea  . Lactose Intolerance (Gi)   . Meloxicam Other (See Comments)    Intestinal Bleed  . Other     Salt-sensitivity Grain-sensitivity Artificial sweetners-sensitivity Sugar-sensitivity  . Wheat Bran     Wheat-sensitivity    Current Outpatient Prescriptions  Medication Sig Dispense Refill  . cholecalciferol (VITAMIN D) 1000 UNITS tablet Take 2,000 Units by mouth 2 (two) times daily.      . Coenzyme Q10 (CO Q 10) 100 MG CAPS Take 200 mg by mouth daily. Quol Ultra CoQ10 175m      . DULoxetine (CYMBALTA) 60 MG capsule TAKE 1 CAPSULE DAILY FOR   MOOD  90 capsule  0  . esomeprazole (NEXIUM) 40 MG capsule Take 40 mg  by mouth daily before breakfast.      . ESTRING 2 MG vaginal ring       . fexofenadine (ALLEGRA) 180 MG tablet Take 180 mg by mouth as needed.      [name] HYDROcodone-acetaminophen (NORCO) 5-325 MG per tablet 1/2 to 1 tablet 3 - 4 x daily for severe pain  100 tablet  0  . Loperamide HCl (IMODIUM PO) Take by mouth as needed.      . mesalamine (ASACOL) 400 MG EC tablet Take 800 mg by mouth daily.      . Methylcellulose, Laxative, (CITRUCEL) 500 MG TABS Take 1,000 mg by mouth 2 (two) times daily.      [name] olmesartan (BENICAR) 40 MG tablet Take 40 mg by mouth daily.      [name] OVER THE COUNTER MEDICATION Take 300 mg by mouth daily. Mega red 324m      . OVER THE COUNTER MEDICATION Take 625 mg by mouth daily. Mega Food Complex C      . predniSONE (DELTASONE) 5 MG tablet Take 5-20 mg by mouth daily as needed (for  inflammation).      . Red Yeast Rice Extract 600 MG TABS Take 1,200 mg by mouth 2 (two) times daily.      . verapamil (VERELAN PM) 240 MG 24 hr capsule TAKE 1 CAPSULE DAILY  90 capsule  0   No current facility-administered medications for this visit.    Review of Systems Review of Systems  Constitutional: Negative for fever, chills and unexpected weight change.  HENT: Negative for congestion, hearing loss, sore throat, trouble swallowing and voice change.   Eyes: Negative for visual disturbance.  Respiratory: Negative for cough and wheezing.   Cardiovascular: Negative for chest pain, palpitations and leg swelling.  Gastrointestinal: Positive for abdominal pain and diarrhea. Negative for nausea, vomiting, blood in stool, abdominal distention and anal bleeding.  Genitourinary: Negative for hematuria, vaginal bleeding and difficulty urinating.  Musculoskeletal: Positive for arthralgias and joint swelling.  Skin: Negative for rash and wound.  Neurological: Negative for seizures, syncope and headaches.  Hematological: Negative for adenopathy. Does not bruise/bleed easily.  Psychiatric/Behavioral: Negative for confusion.    Blood pressure 126/74, pulse 74, temperature 98.5 F (36.9 C), temperature source Oral, height 5' 5"  (1.651 m), weight 139 lb (63.05 kg).  Physical Exam Physical Exam  Constitutional: She is oriented to person, place, and time. She appears well-developed and well-nourished. No distress.  HENT:  Head: Normocephalic and atraumatic.  Right Ear: External ear normal.  Left Ear: External ear normal.  Nose: Nose normal.  Mouth/Throat: Oropharynx is clear and moist. No oropharyngeal exudate.  Eyes: Conjunctivae are normal. Pupils are equal, round, and reactive to light. Right eye exhibits no discharge. Left eye exhibits no discharge. No scleral icterus.  Neck: Normal range of motion. Neck supple. No tracheal deviation present.  Cardiovascular: Normal rate, regular rhythm,  normal heart sounds and intact distal pulses.   No murmur heard. Pulmonary/Chest: Effort normal and breath sounds normal. No respiratory distress. She has no wheezes.  Abdominal: Soft. Bowel sounds are normal. There is tenderness.  There is mild tenderness with guarding in the epigastrium and right upper quadrant  Musculoskeletal: Normal range of motion. She exhibits no edema.  Lymphadenopathy:    She has no cervical adenopathy.  Neurological: She is alert and oriented to person, place, and time.  Skin: Skin is warm and dry. No rash noted. No erythema.  Psychiatric: Her behavior is normal. Judgment normal.  Data Reviewed I have reviewed her ultrasound showing a thickwalled gallbladder.  Her hida was unremarkable  Assessment    Chronic cholecystitis     Plan    Given her history of ulcerative colitis and her symptoms, I do suspect she has some chronic cholecystitis especially given the ultrasound. I discussed continued conservative management versus laparoscopic cholecystectomy. I discussed the risk of surgery which includes but is not limited to bleeding, infection, injury to surrounding structures, Need to convert to an open procedure, bile leak, bile duct injury, DVT given her possible factor V Leiden deficiency, et Ronney Asters. I also discussed the possibility that this may Not resolve her symptoms. After a long discussion, she wishes to proceed with laparoscopic cholecystectomy        Willem Klingensmith A 10/23/2013, 9:49 AM

## 2013-11-09 ENCOUNTER — Other Ambulatory Visit: Payer: Self-pay | Admitting: Internal Medicine

## 2013-11-14 ENCOUNTER — Encounter (HOSPITAL_COMMUNITY): Payer: Self-pay

## 2013-11-14 ENCOUNTER — Encounter (HOSPITAL_COMMUNITY)
Admission: RE | Admit: 2013-11-14 | Discharge: 2013-11-14 | Disposition: A | Discharge status: Home / Self Care | Payer: BC Managed Care – PPO | Source: Ambulatory Visit | Attending: Surgery | Admitting: Surgery

## 2013-11-14 ENCOUNTER — Encounter (HOSPITAL_COMMUNITY)
Admission: RE | Admit: 2013-11-14 | Discharge: 2013-11-14 | Disposition: A | Discharge status: Home / Self Care | Payer: BC Managed Care – PPO | Source: Ambulatory Visit | Attending: Anesthesiology | Admitting: Anesthesiology

## 2013-11-14 ENCOUNTER — Encounter (HOSPITAL_COMMUNITY): Payer: Self-pay | Admitting: Pharmacy Technician

## 2013-11-14 DIAGNOSIS — Z01812 Encounter for preprocedural laboratory examination: Secondary | ICD-10-CM | POA: Insufficient documentation

## 2013-11-14 DIAGNOSIS — Z01818 Encounter for other preprocedural examination: Secondary | ICD-10-CM | POA: Insufficient documentation

## 2013-11-14 LAB — BASIC METABOLIC PANEL
BUN: 12 mg/dL (ref 6–23)
CHLORIDE: 93 meq/L — AB (ref 96–112)
CO2: 26 mEq/L (ref 19–32)
Calcium: 9.9 mg/dL (ref 8.4–10.5)
Creatinine, Ser: 0.66 mg/dL (ref 0.50–1.10)
GFR calc Af Amer: >90 mL/min (ref >90)
GLUCOSE: 95 mg/dL (ref 70–99)
POTASSIUM: 4.2 meq/L (ref 3.7–5.3)
SODIUM: 133 meq/L — AB (ref 137–147)

## 2013-11-14 LAB — CBC
HEMATOCRIT: 38 % (ref 36.0–46.0)
Hemoglobin: 12.8 g/dL (ref 12.0–15.0)
MCH: 30.4 pg (ref 26.0–34.0)
MCHC: 33.7 g/dL (ref 30.0–36.0)
MCV: 90.3 fL (ref 78.0–100.0)
Platelets: 265 10*3/uL (ref 150–400)
RBC: 4.21 MIL/uL (ref 3.87–5.11)
RDW: 13.3 % (ref 11.5–15.5)
WBC: 9.9 10*3/uL (ref 4.0–10.5)

## 2013-11-14 NOTE — Pre-Procedure Instructions (Signed)
Carmen Reynolds  11/14/2013   Your procedure is scheduled on:  11-22-2013    Wednesday   Report to [facility]  2 * 3 at 6:30AM.   Call this number if you have problems the morning of surgery: [phone]   Remember:   Do not eat food or drink liquids after midnight.    Take these medicines the morning of surgery with A SIP OF WATER: cymbalta,nexium,pain medication as needed,asacol,prednisone as directed   Do not wear jewelry, make-up or nail polish.  Do not wear lotions, powders, or perfumes.   Do not shave 48 hours prior to surgery..  Do not bring valuables to the hospital.  [facility] is not responsible   for any belongings or valuables.               Contacts, dentures or bridgework may not be worn into surger y.  Leave suitcase in the car. After surgery it may be brought to your room.  For patients admitted to the hospital, discharge time is determined by your treatment team.               Patients discharged the day of surgery will not be allowed to drive home.   Name and phone number of your driver:    Special Instructions: See attached sheet for instructions on CHG showers   Please read over the following fact sheets that you were given: Pain Booklet, Coughing and Deep Breathing and Surgical Site Infection Prevention

## 2013-11-17 ENCOUNTER — Encounter: Payer: Self-pay | Admitting: Internal Medicine

## 2013-11-17 ENCOUNTER — Encounter (INDEPENDENT_AMBULATORY_CARE_PROVIDER_SITE_OTHER): Payer: Self-pay | Admitting: Surgery

## 2013-11-17 NOTE — Progress Notes (Signed)
Patient ID: Carmen Reynolds, female   DOB: 02/03/1951, 63 y.o.   MRN: [id number]     error

## 2013-11-21 MED ORDER — CIPROFLOXACIN IN D5W 400 MG/200ML IV SOLN
400.0000 mg | INTRAVENOUS | Status: AC
  Administered 2013-11-22: 400 mg via INTRAVENOUS
  Filled 2013-11-21: qty 200

## 2013-11-21 NOTE — H&P (Signed)
Chief Complaint   Patient presents with   .  eval gallbladder     new pt   HPI  Carmen Reynolds is a 63 y.o. female.  HPI  This is a pleasant female referred to me by Dr. Collene Mares for evaluation of abdominal pain. She has been having sharp epigastric abdominal pain hurting through to her back and right upper quadrant for several months. She has no nausea or vomiting. The pain is moderate to severe. It is intermittent. It is worse with fatty meals. It is different from her discomfort from her ulcerative colitis. Bowel movements are loose  Past Medical History   Diagnosis  Date   .  History of fusion of cervical spine  t   .  Fusion of spine of lumbar region  t   .  Lactose intolerance    .  Adult celiac disease    .  Fibromyalgia    .  Hypertension    .  GERD (gastroesophageal reflux disease)    .  Proctitis    .  Ulcerative colitis    .  Diverticulitis    .  Factor V Leiden    .  Concussion      fell jumping off of horse in past 6 months- 09/14/12    Past Surgical History   Procedure  Laterality  Date   .  Tubal ligation   1983   .  Foot surgery  Right  1978     Reconstruction after faliure of emergency stitching   .  Neck surgery   2002     anterior cervical fusion   .  Back surgery   2006, 2008     . disectomy   .  Lasik     .  Eye surgery     .  Anterior lateral lumbar fusion 4 levels  N/A  09/22/2012     Procedure: ANTERIOR LATERAL LUMBAR FUSION 4 LEVELS; Surgeon: Erline Levine, MD; Location: [facility]; Service: Neurosurgery; Laterality: N/A; Thoracic Twelve Lumbar One,Lumbar One-TwoAnterolateral decompression/fusion/percutaneous percutaneous pedicle screws   .  Chest tube insertion  Left  09/22/2012     Procedure: CHEST TUBE INSERTION; Surgeon: Erline Levine, MD; Location: [facility]; Service: Neurosurgery; Laterality: Left; Insertion per Dr. Cyndia Bent    Family History   Problem  Relation  Age of Onset   .  Prostate cancer  Father    .  Heart attack  Brother  39     CABG    .  Heart attack  Brother  74   .  Heart failure  Mother    .  Stroke  Mother    Social History  History   Substance Use Topics   .  Smoking status:  Former Smoker -- 1.00 packs/day for 24 years     Types:  Cigarettes     Quit date:  07/28/1990   .  Smokeless tobacco:  Not on file   .  Alcohol Use:  4.2 oz/week     7 Glasses of wine per week    Allergies   Allergen  Reactions   .  Lyrica [Pregabalin]  Swelling     Swelling of feet.   .  Augmentin [Amoxicillin-Pot Clavulanate]  Diarrhea   .  Lactose Intolerance (Gi)    .  Meloxicam  Other (See Comments)     Intestinal Bleed   .  Other      Salt-sensitivity  Grain-sensitivity  Artificial sweetners-sensitivity  Sugar-sensitivity   .  Wheat Bran      Wheat-sensitivity    Current Outpatient Prescriptions   Medication  Sig  Dispense  Refill   .  cholecalciferol (VITAMIN D) 1000 UNITS tablet  Take 2,000 Units by mouth 2 (two) times daily.     .  Coenzyme Q10 (CO Q 10) 100 MG CAPS  Take 200 mg by mouth daily. Quol Ultra CoQ10 171m     .  DULoxetine (CYMBALTA) 60 MG capsule  TAKE 1 CAPSULE DAILY FOR MOOD  90 capsule  0   .  esomeprazole (NEXIUM) 40 MG capsule  Take 40 mg by mouth daily before breakfast.     .  ESTRING 2 MG vaginal ring      .  fexofenadine (ALLEGRA) 180 MG tablet  Take 180 mg by mouth as needed.     .[name] HYDROcodone-acetaminophen (NORCO) 5-325 MG per tablet  1/2 to 1 tablet 3 - 4 x daily for severe pain  100 tablet  0   .  Loperamide HCl (IMODIUM PO)  Take by mouth as needed.     .  mesalamine (ASACOL) 400 MG EC tablet  Take 800 mg by mouth daily.     .  Methylcellulose, Laxative, (CITRUCEL) 500 MG TABS  Take 1,000 mg by mouth 2 (two) times daily.     .[name] olmesartan (BENICAR) 40 MG tablet  Take 40 mg by mouth daily.     .[name] OVER THE COUNTER MEDICATION  Take 300 mg by mouth daily. Mega red 3093m    .  OVER THE COUNTER MEDICATION  Take 625 mg by mouth daily. Mega Food Complex C     .  predniSONE (DELTASONE) 5 MG  tablet  Take 5-20 mg by mouth daily as needed (for inflammation).     .  Red Yeast Rice Extract 600 MG TABS  Take 1,200 mg by mouth 2 (two) times daily.     .  verapamil (VERELAN PM) 240 MG 24 hr capsule  TAKE 1 CAPSULE DAILY  90 capsule  0    No current facility-administered medications for this visit.   Review of Systems  Review of Systems  Constitutional: Negative for fever, chills and unexpected weight change.  HENT: Negative for congestion, hearing loss, sore throat, trouble swallowing and voice change.  Eyes: Negative for visual disturbance.  Respiratory: Negative for cough and wheezing.  Cardiovascular: Negative for chest pain, palpitations and leg swelling.  Gastrointestinal: Positive for abdominal pain and diarrhea. Negative for nausea, vomiting, blood in stool, abdominal distention and anal bleeding.  Genitourinary: Negative for hematuria, vaginal bleeding and difficulty urinating.  Musculoskeletal: Positive for arthralgias and joint swelling.  Skin: Negative for rash and wound.  Neurological: Negative for seizures, syncope and headaches.  Hematological: Negative for adenopathy. Does not bruise/bleed easily.  Psychiatric/Behavioral: Negative for confusion.  Blood pressure 126/74, pulse 74, temperature 98.5 F (36.9 C), temperature source Oral, height 5' 5"  (1.651 m), weight 139 lb (63.05 kg).  Physical Exam  Physical Exam  Constitutional: She is oriented to person, place, and time. She appears well-developed and well-nourished. No distress.  HENT:  Head: Normocephalic and atraumatic.  Right Ear: External ear normal.  Left Ear: External ear normal.  Nose: Nose normal.  Mouth/Throat: Oropharynx is clear and moist. No oropharyngeal exudate.  Eyes: Conjunctivae are normal. Pupils are equal, round, and reactive to light. Right eye exhibits no discharge. Left eye exhibits no discharge. No scleral icterus.  Neck:  Normal range of motion. Neck supple. No tracheal deviation present.   Cardiovascular: Normal rate, regular rhythm, normal heart sounds and intact distal pulses.  No murmur heard.  Pulmonary/Chest: Effort normal and breath sounds normal. No respiratory distress. She has no wheezes.  Abdominal: Soft. Bowel sounds are normal. There is tenderness.  There is mild tenderness with guarding in the epigastrium and right upper quadrant  Musculoskeletal: Normal range of motion. She exhibits no edema.  Lymphadenopathy:  She has no cervical adenopathy.  Neurological: She is alert and oriented to person, place, and time.  Skin: Skin is warm and dry. No rash noted. No erythema.  Psychiatric: Her behavior is normal. Judgment normal.  Data Reviewed  I have reviewed her ultrasound showing a thickwalled gallbladder. Her hida was unremarkable  Assessment  Chronic cholecystitis  Plan  Given her history of ulcerative colitis and her symptoms, I do suspect she has some chronic cholecystitis especially given the ultrasound. I discussed continued conservative management versus laparoscopic cholecystectomy. I discussed the risk of surgery which includes but is not limited to bleeding, infection, injury to surrounding structures, Need to convert to an open procedure, bile leak, bile duct injury, DVT given her possible factor V Leiden deficiency, et Ronney Asters. I also discussed the possibility that this may Not resolve her symptoms. After a long discussion, she wishes to proceed with laparoscopic cholecystectomy

## 2013-11-22 ENCOUNTER — Encounter (HOSPITAL_COMMUNITY): Payer: BC Managed Care – PPO | Admitting: Anesthesiology

## 2013-11-22 ENCOUNTER — Ambulatory Visit (HOSPITAL_COMMUNITY)
Admission: RE | Admit: 2013-11-22 | Discharge: 2013-11-22 | Disposition: A | Discharge status: Home / Self Care | Payer: BC Managed Care – PPO | Source: Ambulatory Visit | Attending: Surgery | Admitting: Surgery

## 2013-11-22 ENCOUNTER — Encounter (HOSPITAL_COMMUNITY)
Admission: RE | Disposition: A | Discharge status: Home / Self Care | Payer: Self-pay | Source: Ambulatory Visit | Attending: Surgery

## 2013-11-22 ENCOUNTER — Ambulatory Visit (HOSPITAL_COMMUNITY): Payer: BC Managed Care – PPO | Admitting: Anesthesiology

## 2013-11-22 ENCOUNTER — Encounter (HOSPITAL_COMMUNITY): Payer: Self-pay | Admitting: *Deleted

## 2013-11-22 DIAGNOSIS — Z87891 Personal history of nicotine dependence: Secondary | ICD-10-CM | POA: Insufficient documentation

## 2013-11-22 DIAGNOSIS — K811 Chronic cholecystitis: Secondary | ICD-10-CM | POA: Insufficient documentation

## 2013-11-22 DIAGNOSIS — Z981 Arthrodesis status: Secondary | ICD-10-CM | POA: Insufficient documentation

## 2013-11-22 DIAGNOSIS — K5190 Ulcerative colitis, unspecified, without complications: Secondary | ICD-10-CM | POA: Insufficient documentation

## 2013-11-22 DIAGNOSIS — IMO0001 Reserved for inherently not codable concepts without codable children: Secondary | ICD-10-CM | POA: Insufficient documentation

## 2013-11-22 DIAGNOSIS — K219 Gastro-esophageal reflux disease without esophagitis: Secondary | ICD-10-CM | POA: Insufficient documentation

## 2013-11-22 DIAGNOSIS — D6859 Other primary thrombophilia: Secondary | ICD-10-CM | POA: Insufficient documentation

## 2013-11-22 DIAGNOSIS — I10 Essential (primary) hypertension: Secondary | ICD-10-CM | POA: Insufficient documentation

## 2013-11-22 DIAGNOSIS — F1920 Other psychoactive substance dependence, uncomplicated: Secondary | ICD-10-CM | POA: Insufficient documentation

## 2013-11-22 DIAGNOSIS — K900 Celiac disease: Secondary | ICD-10-CM | POA: Insufficient documentation

## 2013-11-22 HISTORY — PX: CHOLECYSTECTOMY: SHX55

## 2013-11-22 SURGERY — LAPAROSCOPIC CHOLECYSTECTOMY WITH INTRAOPERATIVE CHOLANGIOGRAM
Anesthesia: General | Site: Abdomen

## 2013-11-22 MED ORDER — LIDOCAINE HCL (CARDIAC) 20 MG/ML IV SOLN
INTRAVENOUS | Status: AC
  Filled 2013-11-22: qty 10

## 2013-11-22 MED ORDER — FENTANYL CITRATE 0.05 MG/ML IJ SOLN
INTRAMUSCULAR | Status: DC | PRN
  Administered 2013-11-22: 50 ug via INTRAVENOUS
  Administered 2013-11-22: 100 ug via INTRAVENOUS

## 2013-11-22 MED ORDER — 0.9 % SODIUM CHLORIDE (POUR BTL) OPTIME
TOPICAL | Status: DC | PRN
  Administered 2013-11-22: 1000 mL

## 2013-11-22 MED ORDER — SODIUM CHLORIDE 0.9 % IR SOLN
Status: DC | PRN
  Administered 2013-11-22: 1

## 2013-11-22 MED ORDER — EPHEDRINE SULFATE 50 MG/ML IJ SOLN
INTRAMUSCULAR | Status: DC | PRN
  Administered 2013-11-22: 10 mg via INTRAVENOUS

## 2013-11-22 MED ORDER — OXYCODONE HCL 5 MG/5ML PO SOLN: 5.0000 mg | Freq: Once | ORAL | Status: AC | PRN

## 2013-11-22 MED ORDER — MIDAZOLAM HCL 2 MG/2ML IJ SOLN
INTRAMUSCULAR | Status: AC
  Filled 2013-11-22: qty 2

## 2013-11-22 MED ORDER — PROPOFOL 10 MG/ML IV BOLUS
INTRAVENOUS | Status: AC
  Filled 2013-11-22: qty 20

## 2013-11-22 MED ORDER — BUPIVACAINE-EPINEPHRINE 0.25% -1:200000 IJ SOLN
INTRAMUSCULAR | Status: DC | PRN
Start: 2013-11-22 — End: 2013-11-22
  Administered 2013-11-22: 20 mL

## 2013-11-22 MED ORDER — ROCURONIUM BROMIDE 100 MG/10ML IV SOLN
INTRAVENOUS | Status: DC | PRN
  Administered 2013-11-22: 20 mg via INTRAVENOUS

## 2013-11-22 MED ORDER — OXYCODONE HCL 5 MG PO TABS
5.0000 mg | ORAL_TABLET | Freq: Once | ORAL | Status: AC | PRN
  Administered 2013-11-22: 5 mg via ORAL

## 2013-11-22 MED ORDER — PROPOFOL 10 MG/ML IV BOLUS
INTRAVENOUS | Status: DC | PRN
  Administered 2013-11-22: 180 mg via INTRAVENOUS

## 2013-11-22 MED ORDER — ONDANSETRON HCL 4 MG/2ML IJ SOLN
INTRAMUSCULAR | Status: DC | PRN
  Administered 2013-11-22: 4 mg via INTRAVENOUS

## 2013-11-22 MED ORDER — KETOROLAC TROMETHAMINE 30 MG/ML IJ SOLN
INTRAMUSCULAR | Status: AC
  Administered 2013-11-22: 30 mg via INTRAVENOUS
  Filled 2013-11-22: qty 1

## 2013-11-22 MED ORDER — FENTANYL CITRATE 0.05 MG/ML IJ SOLN
INTRAMUSCULAR | Status: AC
  Filled 2013-11-22: qty 5

## 2013-11-22 MED ORDER — HYDROMORPHONE HCL PF 1 MG/ML IJ SOLN
INTRAMUSCULAR | Status: AC
  Administered 2013-11-22: 0.5 mg via INTRAVENOUS
  Filled 2013-11-22: qty 1

## 2013-11-22 MED ORDER — HYDROMORPHONE HCL PF 1 MG/ML IJ SOLN
0.2500 mg | INTRAMUSCULAR | Status: DC | PRN
  Administered 2013-11-22 (×2): 0.5 mg via INTRAVENOUS

## 2013-11-22 MED ORDER — MIDAZOLAM HCL 5 MG/5ML IJ SOLN
INTRAMUSCULAR | Status: DC | PRN
Start: 2013-11-22 — End: 2013-11-22
  Administered 2013-11-22: 2 mg via INTRAVENOUS

## 2013-11-22 MED ORDER — OXYCODONE HCL 5 MG PO TABS
ORAL_TABLET | ORAL | Status: DC
Start: 2013-11-22 — End: 2013-11-22
  Filled 2013-11-22: qty 1

## 2013-11-22 MED ORDER — ROCURONIUM BROMIDE 50 MG/5ML IV SOLN
INTRAVENOUS | Status: AC
  Filled 2013-11-22: qty 1

## 2013-11-22 MED ORDER — LACTATED RINGERS IV SOLN
INTRAVENOUS | Status: DC | PRN
  Administered 2013-11-22 (×2): via INTRAVENOUS

## 2013-11-22 MED ORDER — PROMETHAZINE HCL 25 MG/ML IJ SOLN: 6.2500 mg | INTRAMUSCULAR | Status: DC | PRN

## 2013-11-22 MED ORDER — EPHEDRINE SULFATE 50 MG/ML IJ SOLN
INTRAMUSCULAR | Status: AC
  Filled 2013-11-22: qty 1

## 2013-11-22 MED ORDER — SODIUM CHLORIDE 0.9 % IJ SOLN
INTRAMUSCULAR | Status: AC
  Filled 2013-11-22: qty 10

## 2013-11-22 MED ORDER — BUPIVACAINE-EPINEPHRINE (PF) 0.25% -1:200000 IJ SOLN
INTRAMUSCULAR | Status: AC
  Filled 2013-11-22: qty 30

## 2013-11-22 MED ORDER — LIDOCAINE HCL (CARDIAC) 20 MG/ML IV SOLN
INTRAVENOUS | Status: DC | PRN
Start: 2013-11-22 — End: 2013-11-22
  Administered 2013-11-22: 80 mg via INTRAVENOUS

## 2013-11-22 MED ORDER — ONDANSETRON HCL 4 MG/2ML IJ SOLN
INTRAMUSCULAR | Status: AC
  Filled 2013-11-22: qty 2

## 2013-11-22 MED ORDER — HYDROCODONE-ACETAMINOPHEN 5-325 MG PO TABS: 0.5000 | ORAL_TABLET | Freq: Four times a day (QID) | ORAL | Status: DC | PRN

## 2013-11-22 MED ORDER — GLYCOPYRROLATE 0.2 MG/ML IJ SOLN
INTRAMUSCULAR | Status: DC | PRN
  Administered 2013-11-22: 0.4 mg via INTRAVENOUS

## 2013-11-22 MED ORDER — NEOSTIGMINE METHYLSULFATE 1 MG/ML IJ SOLN
INTRAMUSCULAR | Status: DC | PRN
  Administered 2013-11-22: 3 mg via INTRAVENOUS

## 2013-11-22 MED ORDER — ARTIFICIAL TEARS OP OINT
TOPICAL_OINTMENT | OPHTHALMIC | Status: AC
  Filled 2013-11-22: qty 3.5

## 2013-11-22 SURGICAL SUPPLY — 39 items
APPLIER CLIP 5 13 M/L LIGAMAX5 (MISCELLANEOUS) ×3
BANDAGE ADHESIVE 1X3 (GAUZE/BANDAGES/DRESSINGS) ×12 IMPLANT
BENZOIN TINCTURE PRP APPL 2/3 (GAUZE/BANDAGES/DRESSINGS) ×3 IMPLANT
CANISTER SUCTION 2500CC (MISCELLANEOUS) ×3 IMPLANT
CHLORAPREP W/TINT 26ML (MISCELLANEOUS) ×3 IMPLANT
CLIP APPLIE 5 13 M/L LIGAMAX5 (MISCELLANEOUS) ×1 IMPLANT
COVER MAYO STAND STRL (DRAPES) IMPLANT
COVER SURGICAL LIGHT HANDLE (MISCELLANEOUS) ×3 IMPLANT
DECANTER SPIKE VIAL GLASS SM (MISCELLANEOUS) ×3 IMPLANT
DRAPE C-ARM 42X72 X-RAY (DRAPES) IMPLANT
DRAPE UTILITY 15X26 W/TAPE STR (DRAPE) ×6 IMPLANT
ELECT REM PT RETURN 9FT ADLT (ELECTROSURGICAL) ×3
ELECTRODE REM PT RTRN 9FT ADLT (ELECTROSURGICAL) ×1 IMPLANT
GLOVE BIO SURGEON STRL SZ8 (GLOVE) ×3 IMPLANT
GLOVE BIOGEL PI IND STRL 7.5 (GLOVE) ×1 IMPLANT
GLOVE BIOGEL PI IND STRL 8 (GLOVE) ×1 IMPLANT
GLOVE BIOGEL PI INDICATOR 7.5 (GLOVE) ×2
GLOVE BIOGEL PI INDICATOR 8 (GLOVE) ×2
GLOVE SURG SIGNA 7.5 PF LTX (GLOVE) ×3 IMPLANT
GOWN STRL REUS W/ TWL LRG LVL3 (GOWN DISPOSABLE) ×3 IMPLANT
GOWN STRL REUS W/ TWL XL LVL3 (GOWN DISPOSABLE) ×1 IMPLANT
GOWN STRL REUS W/TWL LRG LVL3 (GOWN DISPOSABLE) ×6
GOWN STRL REUS W/TWL XL LVL3 (GOWN DISPOSABLE) ×2
KIT BASIN OR (CUSTOM PROCEDURE TRAY) ×3 IMPLANT
KIT ROOM TURNOVER OR (KITS) ×3 IMPLANT
NS IRRIG 1000ML POUR BTL (IV SOLUTION) ×3 IMPLANT
PAD ARMBOARD 7.5X6 YLW CONV (MISCELLANEOUS) ×3 IMPLANT
POUCH SPECIMEN RETRIEVAL 10MM (ENDOMECHANICALS) IMPLANT
SCISSORS LAP 5X35 DISP (ENDOMECHANICALS) ×3 IMPLANT
SET CHOLANGIOGRAPH 5 50 .035 (SET/KITS/TRAYS/PACK) IMPLANT
SET IRRIG TUBING LAPAROSCOPIC (IRRIGATION / IRRIGATOR) ×3 IMPLANT
SLEEVE ENDOPATH XCEL 5M (ENDOMECHANICALS) ×6 IMPLANT
SPECIMEN JAR SMALL (MISCELLANEOUS) ×3 IMPLANT
SUT MON AB 4-0 PC3 18 (SUTURE) ×3 IMPLANT
TOWEL OR 17X24 6PK STRL BLUE (TOWEL DISPOSABLE) ×3 IMPLANT
TOWEL OR 17X26 10 PK STRL BLUE (TOWEL DISPOSABLE) ×3 IMPLANT
TRAY LAPAROSCOPIC (CUSTOM PROCEDURE TRAY) ×3 IMPLANT
TROCAR XCEL BLUNT TIP 100MML (ENDOMECHANICALS) ×3 IMPLANT
TROCAR XCEL NON-BLD 5MMX100MML (ENDOMECHANICALS) ×3 IMPLANT

## 2013-11-22 NOTE — Interval H&P Note (Signed)
History and Physical Interval Note: no change in H and P  11/22/2013 7:46 AM  Carmen Reynolds  has presented today for surgery, with the diagnosis of cholecystitis   The various methods of treatment have been discussed with the patient and family. After consideration of risks, benefits and other options for treatment, the patient has consented to  Procedure(s): LAPAROSCOPIC CHOLECYSTECTOMY WITH possible INTRAOPERATIVE CHOLANGIOGRAM (N/A) as a surgical intervention .  The patient's history has been reviewed, patient examined, no change in status, stable for surgery.  I have reviewed the patient's chart and labs.  Questions were answered to the patient's satisfaction.     Harl Bowie

## 2013-11-22 NOTE — Anesthesia Procedure Notes (Signed)
Procedure Name: Intubation Date/Time: 11/22/2013 8:34 AM Performed by: Trixie Deis A Pre-anesthesia Checklist: Patient identified, Timeout performed, Emergency Drugs available, Patient being monitored and Suction available Patient Re-evaluated:Patient Re-evaluated prior to inductionOxygen Delivery Method: Circle system utilized Preoxygenation: Pre-oxygenation with 100% oxygen Intubation Type: IV induction Ventilation: Mask ventilation without difficulty Laryngoscope Size: Mac and 3 Grade View: Grade I Tube type: Oral Tube size: 7.0 mm Number of attempts: 1 Airway Equipment and Method: Stylet Placement Confirmation: ETT inserted through vocal cords under direct vision,  breath sounds checked- equal and bilateral and positive ETCO2 Secured at: 21 cm Tube secured with: Tape Dental Injury: Teeth and Oropharynx as per pre-operative assessment

## 2013-11-22 NOTE — Transfer of Care (Signed)
Immediate Anesthesia Transfer of Care Note  Patient: Carmen Reynolds  Procedure(s) Performed: Procedure(s): LAPAROSCOPIC CHOLECYSTECTOMY  (N/A)  Patient Location: PACU  Anesthesia Type:General  Level of Consciousness: awake, alert  and oriented  Airway & Oxygen Therapy: Patient Spontanous Breathing and Patient connected to nasal cannula oxygen  Post-op Assessment: Report given to PACU RN, Post -op Vital signs reviewed and stable and Patient moving all extremities  Post vital signs: Reviewed and stable  Complications: No apparent anesthesia complications

## 2013-11-22 NOTE — Anesthesia Preprocedure Evaluation (Addendum)
Anesthesia Evaluation  Patient identified by MRN, date of birth, ID band Patient awake    Reviewed: Allergy & Precautions, H&P , NPO status , Patient's Chart, lab work & pertinent test results  History of Anesthesia Complications Negative for: history of anesthetic complications  Airway Mallampati: II TM Distance: >3 FB Neck ROM: Full    Dental  (+) Teeth Intact, Dental Advisory Given   Pulmonary former smoker,    Pulmonary exam normal       Cardiovascular hypertension, Pt. on medications     Neuro/Psych negative neurological ROS  negative psych ROS   GI/Hepatic Neg liver ROS, PUD, GERD-  ,  Endo/Other  negative endocrine ROS  Renal/GU negative Renal ROS     Musculoskeletal  (+) Fibromyalgia -, narcotic dependent  Abdominal   Peds  Hematology negative hematology ROS (+)   Anesthesia Other Findings   Reproductive/Obstetrics                          Anesthesia Physical Anesthesia Plan  ASA: II  Anesthesia Plan: General   Post-op Pain Management:    Induction:   Airway Management Planned: Oral ETT  Additional Equipment:   Intra-op Plan:   Post-operative Plan: Extubation in OR  Informed Consent: I have reviewed the patients History and Physical, chart, labs and discussed the procedure including the risks, benefits and alternatives for the proposed anesthesia with the patient or authorized representative who has indicated his/her understanding and acceptance.   Dental advisory given  Plan Discussed with: CRNA, Anesthesiologist and Surgeon  Anesthesia Plan Comments:        Anesthesia Quick Evaluation

## 2013-11-22 NOTE — Discharge Instructions (Signed)
[facility] ______[facility] LAPAROSCOPIC SURGERY: POST OP INSTRUCTIONS Always review your discharge instruction sheet given to you by the facility where your surgery was performed. IF YOU HAVE DISABILITY OR FAMILY LEAVE FORMS, YOU MUST BRING THEM TO THE OFFICE FOR PROCESSING.   DO NOT GIVE THEM TO YOUR DOCTOR.  1. A prescription for pain medication may be given to you upon discharge.  Take your pain medication as prescribed, if needed.  If narcotic pain medicine is not needed, then you may take acetaminophen (Tylenol) or ibuprofen (Advil) as needed. 2. Take your usually prescribed medications unless otherwise directed. 3. If you need a refill on your pain medication, please contact your pharmacy.  They will contact our office to request authorization. Prescriptions will not be filled after 5pm or on week-ends. 4. You should follow a light diet the first few days after arrival home, such as soup and crackers, etc.  Be sure to include lots of fluids daily. 5. Most patients will experience some swelling and bruising in the area of the incisions.  Ice packs will help.  Swelling and bruising can take several days to resolve.  6. It is common to experience some constipation if taking pain medication after surgery.  Increasing fluid intake and taking a stool softener (such as Colace) will usually help or prevent this problem from occurring.  A mild laxative (Milk of Magnesia or Miralax) should be taken according to package instructions if there are no bowel movements after 48 hours. 7. Unless discharge instructions indicate otherwise, you may remove your bandages 24-48 hours after surgery, and you may shower at that time.  You may have steri-strips (small skin tapes) in place directly over the incision.  These strips should be left on the skin for 7-10 days.  If your surgeon used skin glue on the incision, you may shower in 24 hours.  The glue will flake off over the next 2-3 weeks.  Any sutures or  staples will be removed at the office during your follow-up visit. 8. ACTIVITIES:  You may resume regular (light) daily activities beginning the next day--such as daily self-care, walking, climbing stairs--gradually increasing activities as tolerated.  You may have sexual intercourse when it is comfortable.  Refrain from any heavy lifting or straining until approved by your doctor. a. You may drive when you are no longer taking prescription pain medication, you can comfortably wear a seatbelt, and you can safely maneuver your car and apply brakes. b. RETURN TO WORK:  __________________________________________________________ 9. You should see your doctor in the office for a follow-up appointment approximately 2-3 weeks after your surgery.  Make sure that you call for this appointment within a day or two after you arrive home to insure a convenient appointment time. 10. OTHER INSTRUCTIONS: ___no lifting more than 15 to 20 pounds for 2 weeks 11. Ice pack and ibuprofen also for pain_______________________________________________________________________________________________________________________ __________________________________________________________________________________________________________________________ WHEN TO CALL YOUR DOCTOR: 1. Fever over 101.0 2. Inability to urinate 3. Continued bleeding from incision. 4. Increased pain, redness, or drainage from the incision. 5. Increasing abdominal pain  The clinic staff is available to answer your questions during regular business hours.  Please dont hesitate to call and ask to speak to one of the nurses for clinical concerns.  If you have a medical emergency, go to the nearest emergency room or call 911.  A surgeon from [facility] is always on call at the hospital. [address], [place], [place], [place]  [address] ?  [id number], [place], [place]   [address] [phone] ? [phone] ? FAX [phone] Web site:  [email or web address]

## 2013-11-22 NOTE — Op Note (Signed)
Laparoscopic Cholecystectomy Procedure  Note  Indications: This patient presents with symptomatic gallbladder disease and will undergo laparoscopic cholecystectomy.  Pre-operative Diagnosis: Chronic cholecystitis  Post-operative Diagnosis: Same  Surgeon: Harl Bowie   Assistants: 0  Anesthesia: General endotracheal anesthesia  ASA Class: 1  Procedure Details  The patient was seen again in the Holding Room. The risks, benefits, complications, treatment options, and expected outcomes were discussed with the patient. The possibilities of reaction to medication, pulmonary aspiration, perforation of viscus, bleeding, recurrent infection, finding a normal gallbladder, the need for additional procedures, failure to diagnose a condition, the possible need to convert to an open procedure, and creating a complication requiring transfusion or operation were discussed with the patient. The likelihood of improving the patient's symptoms with return to their baseline status is good.  The patient and/or family concurred with the proposed plan, giving informed consent. The site of surgery properly noted. The patient was taken to Operating Room, identified as RUQAYYAH LUTE and the procedure verified as Laparoscopic Cholecystectomy with Intraoperative Cholangiogram. A Time Out was held and the above information confirmed.  Prior to the induction of general anesthesia, antibiotic prophylaxis was administered. General endotracheal anesthesia was then administered and tolerated well. After the induction, the abdomen was prepped with Chloraprep and draped in sterile fashion. The patient was positioned in the supine position.  Local anesthetic agent was injected into the skin near the umbilicus and an incision made. We dissected down to the abdominal fascia with blunt dissection.  The fascia was incised vertically and we entered the peritoneal cavity bluntly.  A pursestring suture of 0-Vicryl was placed around  the fascial opening.  The Hasson cannula was inserted and secured with the stay suture.  Pneumoperitoneum was then created with CO2 and tolerated well without any adverse changes in the patient's vital signs. An 11-mm port was placed in the subxiphoid position.  Two 5-mm ports were placed in the right upper quadrant. All skin incisions were infiltrated with a local anesthetic agent before making the incision and placing the trocars.   We positioned the patient in reverse Trendelenburg, tilted slightly to the patient's left.  The gallbladder was identified, the fundus grasped and retracted cephalad. Adhesions were lysed bluntly and with the electrocautery where indicated, taking care not to injure any adjacent organs or viscus. The infundibulum was grasped and retracted laterally, exposing the peritoneum overlying the triangle of Calot. This was then divided and exposed in a blunt fashion. The cystic duct was clearly identified and bluntly dissected circumferentially. A critical view of the cystic duct and cystic artery was obtained.  The cystic duct was then ligated with clips and divided. The cystic artery was, dissected free, ligated with clips and divided as well.   The gallbladder was dissected from the liver bed in retrograde fashion with the electrocautery. The gallbladder was removed and placed in an Endocatch sac. The liver bed was irrigated and inspected. Hemostasis was achieved with the electrocautery. Copious irrigation was utilized and was repeatedly aspirated until clear.  The gallbladder and Endocatch sac were then removed through the umbilical port site.  The pursestring suture was used to close the umbilical fascia.    We again inspected the right upper quadrant for hemostasis.  Pneumoperitoneum was released as we removed the trocars.  4-0 Monocryl was used to close the skin.   Benzoin, steri-strips, and clean dressings were applied. The patient was then extubated and brought to the recovery  room in stable condition. Instrument, sponge,  and needle counts were correct at closure and at the conclusion of the case.   Findings: Thickened gallbladder consistent with chronic Cholecystitis   Estimated Blood Loss: Minimal         Drains: 0         Specimens: Gallbladder           Complications: None; patient tolerated the procedure well.         Disposition: PACU - hemodynamically stable.         Condition: stable

## 2013-11-22 NOTE — Anesthesia Postprocedure Evaluation (Signed)
Anesthesia Post Note  Patient: Carmen Reynolds  Procedure(s) Performed: Procedure(s) (LRB): LAPAROSCOPIC CHOLECYSTECTOMY  (N/A)  Anesthesia type: general  Patient location: PACU  Post pain: Pain level controlled  Post assessment: Patient's Cardiovascular Status Stable  Last Vitals:  Filed Vitals:   11/22/13 1028  BP: 129/74  Pulse: 88  Temp: 36.6 C  Resp: 18    Post vital signs: Reviewed and stable  Level of consciousness: sedated  Complications: No apparent anesthesia complications

## 2013-11-23 ENCOUNTER — Encounter (HOSPITAL_COMMUNITY): Payer: Self-pay | Admitting: Surgery

## 2013-12-05 ENCOUNTER — Telehealth (INDEPENDENT_AMBULATORY_CARE_PROVIDER_SITE_OTHER): Payer: Self-pay | Admitting: General Surgery

## 2013-12-05 ENCOUNTER — Telehealth (INDEPENDENT_AMBULATORY_CARE_PROVIDER_SITE_OTHER): Payer: Self-pay

## 2013-12-05 ENCOUNTER — Other Ambulatory Visit (INDEPENDENT_AMBULATORY_CARE_PROVIDER_SITE_OTHER): Payer: Self-pay | Admitting: General Surgery

## 2013-12-05 DIAGNOSIS — K811 Chronic cholecystitis: Secondary | ICD-10-CM

## 2013-12-05 MED ORDER — HYDROCODONE-ACETAMINOPHEN 5-325 MG PO TABS: 0.5000 | ORAL_TABLET | Freq: Four times a day (QID) | ORAL | Status: DC | PRN

## 2013-12-05 NOTE — Telephone Encounter (Signed)
Pt  S/p lap chole on 4/29. Pt states yesterday she overworked herself and she is having a lot of pain. She rates her pain a 8 out of 10. She took Ibuprofen last night without any relief. Pt states that she is unable to take tylenol. She denies and fevers or chills.  She would like to get a refill on her Norco 5/356m. Please advise.

## 2013-12-05 NOTE — Telephone Encounter (Signed)
Called patient to let her know that she has an Rx at the front desk to pick up

## 2013-12-12 ENCOUNTER — Encounter (INDEPENDENT_AMBULATORY_CARE_PROVIDER_SITE_OTHER): Payer: Self-pay | Admitting: Surgery

## 2013-12-12 ENCOUNTER — Ambulatory Visit (INDEPENDENT_AMBULATORY_CARE_PROVIDER_SITE_OTHER): Payer: BC Managed Care – PPO | Admitting: Surgery

## 2013-12-12 VITALS — BP 127/81 | HR 77 | Temp 98.6°F | Resp 16 | Ht 65.0 in | Wt 138.6 lb

## 2013-12-12 DIAGNOSIS — Z09 Encounter for follow-up examination after completed treatment for conditions other than malignant neoplasm: Secondary | ICD-10-CM

## 2013-12-12 NOTE — Progress Notes (Signed)
Subjective:     Patient ID: Carmen Reynolds, female   DOB: Jun 23, 1951, 63 y.o.   MRN: [id number]  HPI She is here for her first post op visit s/p lap chole. She is doing well and has no complaints  Review of Systems     Objective:   Physical Exam Her incisions are healing well.  Her abdomen is non tender.    The final path showed chronic cholecystitis    Assessment:     Stable post op     Plan:     She may resume normal activity.  I will see her back prn

## 2013-12-25 ENCOUNTER — Other Ambulatory Visit: Payer: Self-pay | Admitting: Emergency Medicine

## 2013-12-25 DIAGNOSIS — K811 Chronic cholecystitis: Secondary | ICD-10-CM

## 2013-12-25 MED ORDER — HYDROCODONE-ACETAMINOPHEN 5-325 MG PO TABS: 0.5000 | ORAL_TABLET | Freq: Four times a day (QID) | ORAL | Status: DC | PRN

## 2013-12-25 NOTE — Progress Notes (Signed)
Patient aware that Rx will be up front for her to pick up.  Patient advised that if symptoms continue she must f/u with Ortho or back doc for further eval and Tx.

## 2013-12-28 ENCOUNTER — Other Ambulatory Visit: Payer: Self-pay | Admitting: Emergency Medicine

## 2014-01-12 ENCOUNTER — Telehealth: Payer: Self-pay | Admitting: *Deleted

## 2014-01-12 NOTE — Telephone Encounter (Signed)
Spoken to pharmacist.  Patient states she takes Asacol 800 mg 2 tabs daily.  OK to change mg and give patient # 6 per Dr Melford Aase.

## 2014-01-12 NOTE — Telephone Encounter (Signed)
Patient called.  She is out of town for the weekend and forgot her meds.  RX called to [facility]([phone])  For Cymbalta 60 mg #3, Nexium 40 mg #3, Verapamil 240 mg CR #3,Benicar 40 mg #3 and Asacol 400 mg 2 daily #6 per Dr Melford Aase.

## 2014-02-01 ENCOUNTER — Other Ambulatory Visit: Payer: Self-pay | Admitting: Internal Medicine

## 2014-02-01 DIAGNOSIS — K2100 Gastro-esophageal reflux disease with esophagitis, without bleeding: Secondary | ICD-10-CM

## 2014-02-01 MED ORDER — ESOMEPRAZOLE MAGNESIUM 40 MG PO CPDR: 40.0000 mg | DELAYED_RELEASE_CAPSULE | Freq: Every day | ORAL | Status: DC

## 2014-02-03 IMAGING — CR DG CHEST 1V PORT
1 series · 1 of 1 positions shown · non-contrast
Comparison: 09/14/2012.

CLINICAL DATA: Status post thoracolumbar fusion..

PORTABLE CHEST - 1 VIEW

[AP]
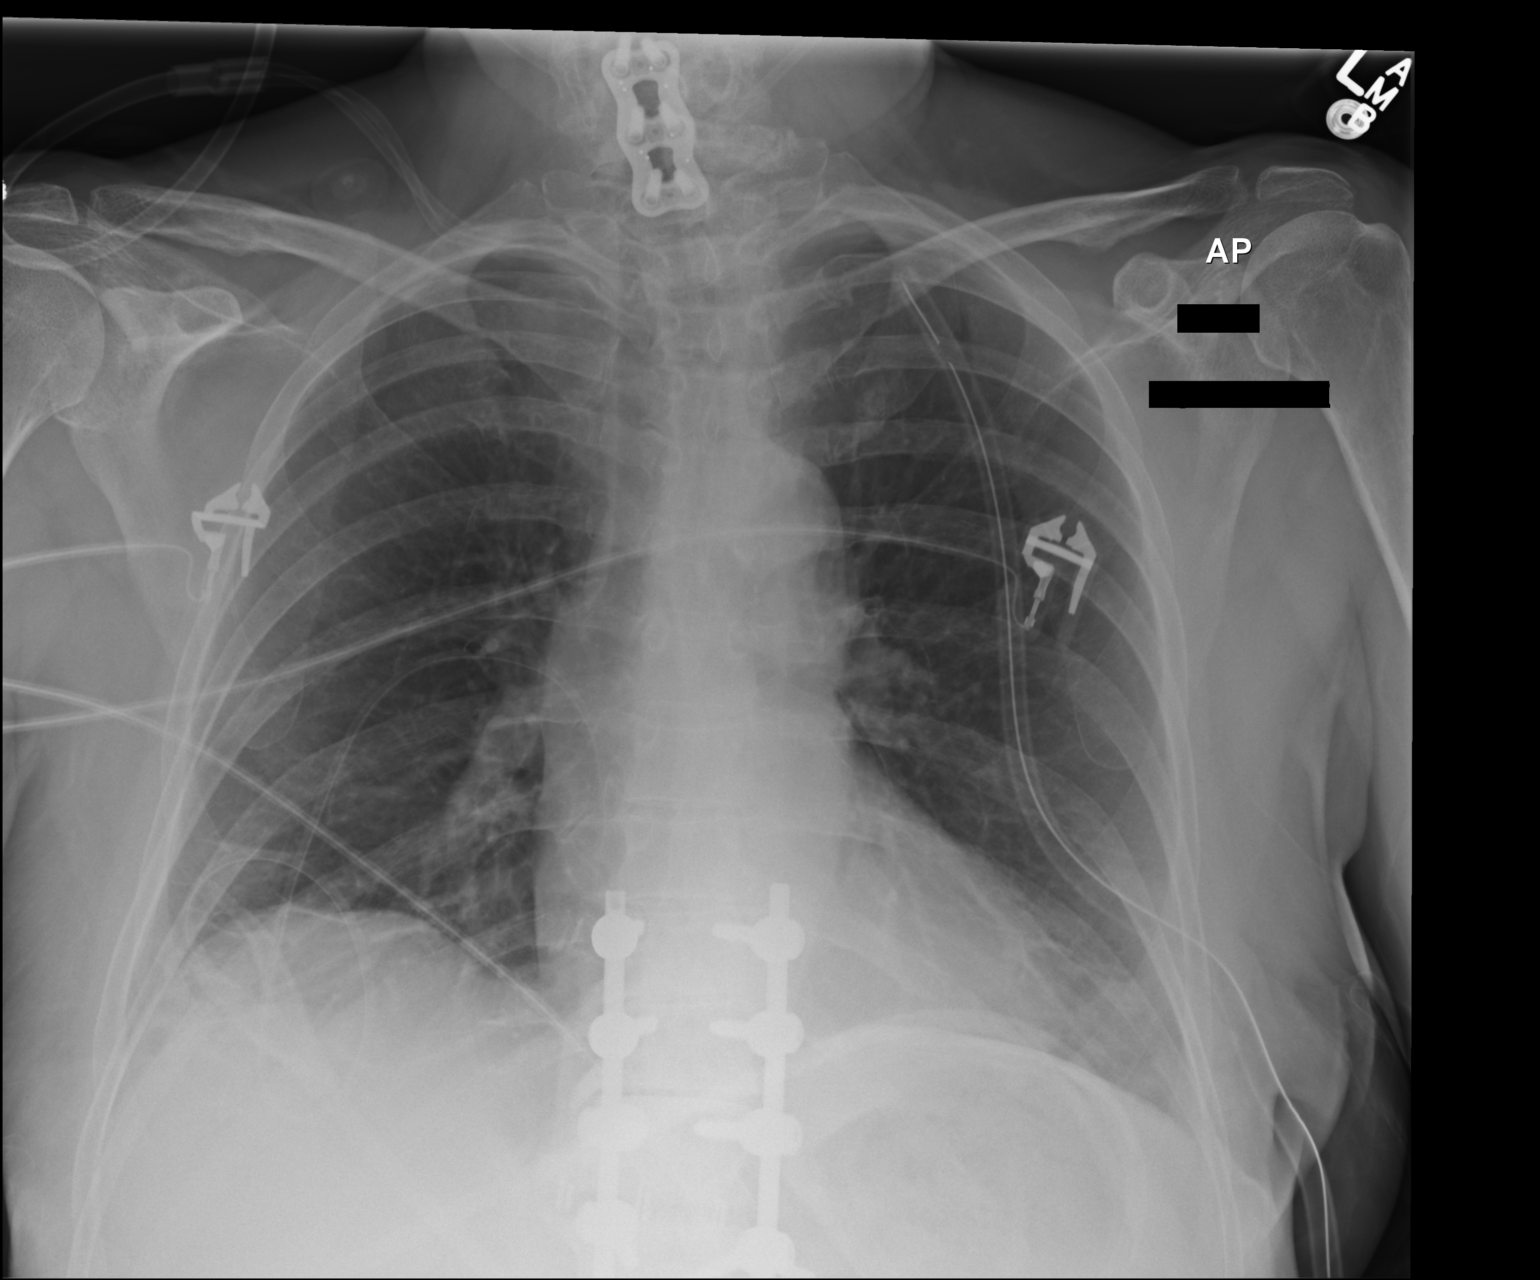

[1 of 1 positions shown; findings below may reference images not displayed]

FINDINGS: Left chest tube good position. There may be a tiny left
apical pneumothorax less than 5% (arrows).  Minimal left basilar
atelectasis. No visible paraspinous hematoma. Normal cardiac size.
New spinous fusion hardware.
IMPRESSION: Cannot exclude tiny left apical pneumothorax less than 5%.  Left
chest tube good position.

## 2014-02-05 IMAGING — CR DG CHEST 1V PORT
1 series · 1 of 1 positions shown · non-contrast
Comparison: 09/23/2012.

CLINICAL DATA: Left pneumothorax

PORTABLE CHEST - 1 VIEW

[AP]
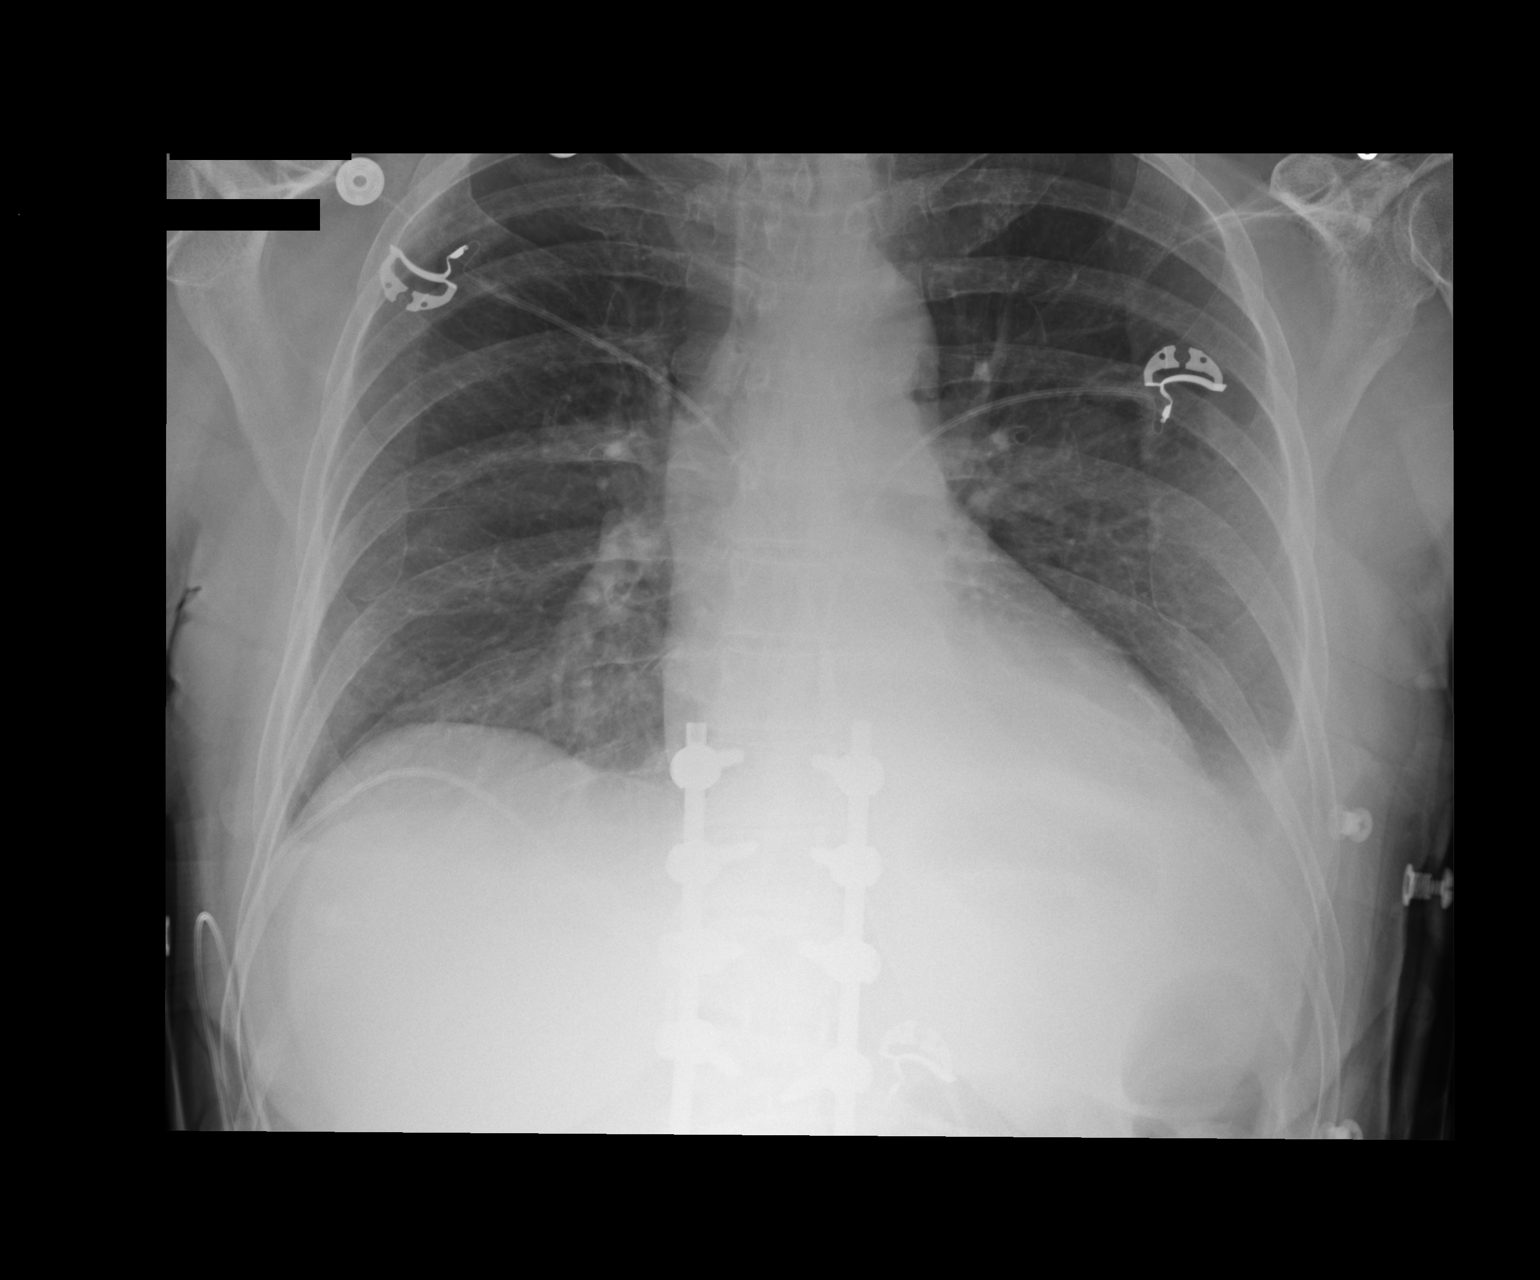

[1 of 1 positions shown; findings below may reference images not displayed]

FINDINGS: A tiny residual left apical pneumothorax is possible.
Small left pleural effusion.

Lungs are otherwise clear.

The heart is normal in size.

Lumbar spine fixation hardware.
IMPRESSION: Tiny residual left apical pneumothorax is possible.

Small left pleural effusion.

## 2014-02-19 ENCOUNTER — Ambulatory Visit (INDEPENDENT_AMBULATORY_CARE_PROVIDER_SITE_OTHER): Payer: BC Managed Care – PPO | Admitting: Physician Assistant

## 2014-02-19 ENCOUNTER — Encounter: Payer: Self-pay | Admitting: Physician Assistant

## 2014-02-19 ENCOUNTER — Ambulatory Visit: Payer: Self-pay | Admitting: Emergency Medicine

## 2014-02-19 VITALS — BP 128/72 | HR 76 | Temp 97.9°F | Resp 16 | Ht 64.5 in | Wt 146.0 lb

## 2014-02-19 DIAGNOSIS — I10 Essential (primary) hypertension: Secondary | ICD-10-CM

## 2014-02-19 DIAGNOSIS — Z79899 Other long term (current) drug therapy: Secondary | ICD-10-CM

## 2014-02-19 DIAGNOSIS — R7309 Other abnormal glucose: Secondary | ICD-10-CM

## 2014-02-19 DIAGNOSIS — E782 Mixed hyperlipidemia: Secondary | ICD-10-CM

## 2014-02-19 DIAGNOSIS — N259 Disorder resulting from impaired renal tubular function, unspecified: Secondary | ICD-10-CM

## 2014-02-19 DIAGNOSIS — E559 Vitamin D deficiency, unspecified: Secondary | ICD-10-CM

## 2014-02-19 DIAGNOSIS — K811 Chronic cholecystitis: Secondary | ICD-10-CM

## 2014-02-19 LAB — HEPATIC FUNCTION PANEL
ALT: 24 U/L (ref 0–35)
AST: 20 U/L (ref 0–37)
Albumin: 4.4 g/dL (ref 3.5–5.2)
Alkaline Phosphatase: 103 U/L (ref 39–117)
BILIRUBIN DIRECT: 0.1 mg/dL (ref 0.0–0.3)
BILIRUBIN INDIRECT: 0.2 mg/dL (ref 0.2–1.2)
TOTAL PROTEIN: 6.9 g/dL (ref 6.0–8.3)
Total Bilirubin: 0.3 mg/dL (ref 0.2–1.2)

## 2014-02-19 LAB — CBC WITH DIFFERENTIAL/PLATELET
BASOS PCT: 0 % (ref 0–1)
Basophils Absolute: 0 10*3/uL (ref 0.0–0.1)
EOS ABS: 0.2 10*3/uL (ref 0.0–0.7)
Eosinophils Relative: 2 % (ref 0–5)
HCT: 36.6 % (ref 36.0–46.0)
Hemoglobin: 12.3 g/dL (ref 12.0–15.0)
Lymphocytes Relative: 23 % (ref 12–46)
Lymphs Abs: 2 10*3/uL (ref 0.7–4.0)
MCH: 30.2 pg (ref 26.0–34.0)
MCHC: 33.6 g/dL (ref 30.0–36.0)
MCV: 89.9 fL (ref 78.0–100.0)
Monocytes Absolute: 1.2 10*3/uL — ABNORMAL HIGH (ref 0.1–1.0)
Monocytes Relative: 13 % — ABNORMAL HIGH (ref 3–12)
Neutro Abs: 5.5 10*3/uL (ref 1.7–7.7)
Neutrophils Relative %: 62 % (ref 43–77)
PLATELETS: 348 10*3/uL (ref 150–400)
RBC: 4.07 MIL/uL (ref 3.87–5.11)
RDW: 14.2 % (ref 11.5–15.5)
WBC: 8.9 10*3/uL (ref 4.0–10.5)

## 2014-02-19 LAB — BASIC METABOLIC PANEL WITH GFR
BUN: 14 mg/dL (ref 6–23)
CALCIUM: 9.8 mg/dL (ref 8.4–10.5)
CO2: 30 mEq/L (ref 19–32)
Chloride: 98 mEq/L (ref 96–112)
Creat: 0.67 mg/dL (ref 0.50–1.10)
Glucose, Bld: 85 mg/dL (ref 70–99)
Potassium: 4.6 mEq/L (ref 3.5–5.3)
Sodium: 136 mEq/L (ref 135–145)

## 2014-02-19 LAB — LIPID PANEL
CHOLESTEROL: 223 mg/dL — AB (ref 0–200)
HDL: 102 mg/dL (ref >39)
LDL CALC: 89 mg/dL (ref 0–99)
Total CHOL/HDL Ratio: 2.2 Ratio
Triglycerides: 158 mg/dL — ABNORMAL HIGH (ref <150)
VLDL: 32 mg/dL (ref 0–40)

## 2014-02-19 LAB — HEMOGLOBIN A1C
Hgb A1c MFr Bld: 5.9 % — ABNORMAL HIGH (ref <5.7)
Mean Plasma Glucose: 123 mg/dL — ABNORMAL HIGH (ref <117)

## 2014-02-19 LAB — TSH: TSH: 1.537 u[IU]/mL (ref 0.350–4.500)

## 2014-02-19 LAB — MAGNESIUM: Magnesium: 1.7 mg/dL (ref 1.5–2.5)

## 2014-02-19 MED ORDER — HYDROCODONE-ACETAMINOPHEN 5-325 MG PO TABS: 0.5000 | ORAL_TABLET | Freq: Four times a day (QID) | ORAL | Status: DC | PRN

## 2014-02-19 NOTE — Progress Notes (Signed)
Assessment and Plan:  Hypertension: Continue medication, monitor blood pressure at home. Continue DASH diet. Cholesterol: Continue diet and exercise. Check cholesterol.  Pre-diabetes-Continue diet and exercise. Check A1C Vitamin D Def- check level and continue medications.  Lower back pain- doing PT, discussed pain management, she does not want to go there at this time. I did explain that we do not prescribe pain medications regularly, will refill today but needs to consider going else where for pain management if continues.   Continue diet and meds as discussed. Further disposition pending results of labs.  HPI 63 y.o. female  presents for 3 month follow up with hypertension, hyperlipidemia, prediabetes and vitamin D. Her blood pressure has been controlled at home, today their BP is BP: 128/72 mmHg She does workout. She denies chest pain, shortness of breath, dizziness.  She is not on cholesterol medication and denies myalgias. Her cholesterol is at goal. The cholesterol last visit was:   Lab Results  Component Value Date   CHOL 226* 10/10/2013   HDL 111 10/10/2013   LDLCALC 84 10/10/2013   TRIG 153* 10/10/2013   CHOLHDL 2.0 10/10/2013   She has been working on diet and exercise for prediabetes, and denies paresthesia of the feet, polydipsia and polyuria. Last A1C in the office was:  Lab Results  Component Value Date   HGBA1C 5.9* 10/10/2013   Patient is on Vitamin D supplement.   Lab Results  Component Value Date   VD25OH 105* 10/10/2013   She has been in PT for the last 3 weeks for her back, referred by Dr. Vertell Limber, which has helped some but she continues to be in pain and at times it is difficult to do the PT due to pain. She has gotten a pain script from here in the past and is requesting another prescription.    Current Medications:  Current Outpatient Prescriptions on File Prior to Visit  Medication Sig Dispense Refill  . Calcium Carbonate-Vit D-Min (CALCIUM 1200 PO) Take 2,400 mg  by mouth daily.      . cholecalciferol (VITAMIN D) 1000 UNITS tablet Take 3,000 Units by mouth daily.       . Coenzyme Q10 (CO Q 10) 100 MG CAPS Take 200 mg by mouth daily. Quol Ultra CoQ10 148m      . DULoxetine (CYMBALTA) 60 MG capsule Take 60 mg by mouth daily.      . DULoxetine (CYMBALTA) 60 MG capsule TAKE 1 CAPSULE DAILY FOR   MOOD  90 capsule  0  . esomeprazole (NEXIUM) 40 MG capsule Take 1 capsule (40 mg total) by mouth daily.  90 capsule  99  . fexofenadine (ALLEGRA) 180 MG tablet Take 180 mg by mouth as needed for allergies.       .Marland KitchenHYDROcodone-acetaminophen (NORCO/VICODIN) 5-325 MG per tablet Take 0.5-1 tablets by mouth every 6 (six) hours as needed (pain).  30 tablet  0  . Loperamide HCl (IMODIUM PO) Take 1 tablet by mouth daily as needed (loose stools). May take up to 6 tablets daily      . Methylcellulose, Laxative, (CITRUCEL) 500 MG TABS Take 1,000 mg by mouth daily.       .Marland Kitchenolmesartan (BENICAR) 40 MG tablet Take 40 mg by mouth daily.      .Marland KitchenOVER THE COUNTER MEDICATION Take 300 mg by mouth daily. Mega red 3025m     . OVER THE COUNTER MEDICATION Take 625 mg by mouth daily. Mega Food Complex C      .  Red Yeast Rice Extract 600 MG TABS Take 1,200 mg by mouth 2 (two) times daily.      . verapamil (CALAN-SR) 240 MG CR tablet Take 240 mg by mouth 1 day or 1 dose.       . prednisoLONE 5 MG TABS tablet Take 2.5-5 mg by mouth daily as needed.       No current facility-administered medications on file prior to visit.   Medical History:  Past Medical History  Diagnosis Date  . History of fusion of cervical spine t  . Fusion of spine of lumbar region t  . Lactose intolerance   . Adult celiac disease   . Fibromyalgia   . Hypertension   . GERD (gastroesophageal reflux disease)   . Proctitis   . Ulcerative colitis   . Diverticulitis   . Factor V Leiden   . Concussion     fell jumping off of horse in past 6 months- 09/14/12  . Hyperlipidemia   . DJD (degenerative joint disease)    . DDD (degenerative disc disease)   . Spinal stenosis   . Scoliosis   . Vitamin D deficiency    Allergies:  Allergies  Allergen Reactions  . Lyrica [Pregabalin] Swelling    Swelling of feet.  . Augmentin [Amoxicillin-Pot Clavulanate] Diarrhea  . Lactose Intolerance (Gi)   . Meloxicam Other (See Comments)    Intestinal Bleed  . Other     Salt-sensitivity Grain-sensitivity Artificial sweetners-sensitivity Sugar-sensitivity  . Quinapril   . Vasotec [Enalaprilat] Cough  . Wheat Bran     Wheat-sensitivity     Review of Systems: [X]  = complains of  [ ]  = denies  General: Fatigue [ ]  Fever [ ]  Chills [ ]  Weakness [ ]   Insomnia [ ]  Eyes: Redness [ ]  Blurred vision [ ]  Diplopia [ ]   ENT: Congestion [ ]  Sinus Pain [ ]  Post Nasal Drip [ ]  Sore Throat [ ]  Earache [ ]   Cardiac: Chest pain/pressure [ ]  SOB [ ]  Orthopnea [ ]   Palpitations [ ]   Paroxysmal nocturnal dyspnea[ ]  Claudication [ ]  Edema [ ]   Pulmonary: Cough [ ]  Wheezing[ ]   SOB [ ]   Snoring [ ]   GI: Nausea [ ]  Vomiting[ ]  Dysphagia[ ]  Heartburn[ ]  Abdominal pain [ ]  Constipation [ ] ; Diarrhea [ ] ; BRBPR [ ]  Melena[ ]  GU: Hematuria[ ]  Dysuria [ ]  Nocturia[ ]  Urgency [ ]   Hesitancy [ ]  Discharge [ ]  Neuro: Headaches[ ]  Vertigo[ ]  Paresthesias[ ]  Spasm [ ]  Speech changes [ ]  Incoordination [ ]   Ortho: Arthritis [ ]  Joint pain [ ]  Muscle pain [x ] Joint swelling [ ]  Back Pain [ x] Skin:  Rash [ ]   Pruritis [ ]  Change in skin lesion [ ]   Psych: Depression[ ]  Anxiety[ ]  Confusion [ ]  Memory loss [ ]   Heme/Lypmh: Bleeding [ ]  Bruising [ ]  Enlarged lymph nodes [ ]   Endocrine: Visual blurring [ ]  Paresthesia [ ]  Polyuria [ ]  Polydypsea [ ]    Heat/cold intolerance [ ]  Hypoglycemia [ ]   Family history- Review and unchanged Social history- Review and unchanged Physical Exam: BP 128/72  Pulse 76  Temp(Src) 97.9 F (36.6 C)  Resp 16  Ht 5' 4.5" (1.638 m)  Wt 146 lb (66.225 kg)  BMI 24.68 kg/m2 Wt Readings from Last 3 Encounters:   02/19/14 146 lb (66.225 kg)  12/12/13 138 lb 9.6 oz (62.869 kg)  11/22/13 141 lb (63.957 kg)   General Appearance: Well  nourished, in no apparent distress. Eyes: PERRLA, EOMs, conjunctiva no swelling or erythema Sinuses: No Frontal/maxillary tenderness ENT/Mouth: Ext aud canals clear, TMs without erythema, bulging. No erythema, swelling, or exudate on post pharynx.  Tonsils not swollen or erythematous. Hearing normal.  Neck: Supple, thyroid normal.  Respiratory: Respiratory effort normal, BS equal bilaterally without rales, rhonchi, wheezing or stridor.  Cardio: RRR with no MRGs. Brisk peripheral pulses without edema.  Abdomen: Soft, + BS.  Non tender, no guarding, rebound, hernias, masses. Lymphatics: Non tender without lymphadenopathy.  Musculoskeletal: Full ROM, 5/5 strength, antalgic gait Skin: Warm, dry without rashes, lesions, ecchymosis.  Neuro: Cranial nerves intact. Normal muscle tone, no cerebellar symptoms. Sensation intact.  Psych: Awake and oriented X 3, normal affect, Insight and Judgment appropriate.    Vicie Mutters 10:00 AM

## 2014-02-19 NOTE — Patient Instructions (Signed)
Benefiber is good for constipation/diarrhea/irritable bowel syndrome, it helps with weight loss and can help lower your bad cholesterol. Please do 1-2 TBSP in the morning in water, coffee, or tea. It can take up to a month before you can see a difference with your bowel movements. It is cheapest from [employer], [employer], [employer].     Bad carbs also include fruit juice, alcohol, and sweet tea. These are empty calories that do not signal to your brain that you are full.   Please remember the good carbs are still carbs which convert into sugar. So please measure them out no more than 1/2-1 cup of rice, oatmeal, pasta, and beans.  Veggies are however free foods! Pile them on.   I like lean protein at every meal such as chicken, [place], pork chops, cottage cheese, etc. Just do not fry these meats and please center your meal around vegetable, the meats should be a side dish.   No all fruit is created equal. Please see the list below, the fruit at the bottom is higher in sugars than the fruit at the top

## 2014-02-20 LAB — VITAMIN D 25 HYDROXY (VIT D DEFICIENCY, FRACTURES): VIT D 25 HYDROXY: 86 ng/mL (ref 30–89)

## 2014-03-06 ENCOUNTER — Other Ambulatory Visit: Payer: Self-pay | Admitting: Internal Medicine

## 2014-03-07 ENCOUNTER — Other Ambulatory Visit: Payer: Self-pay | Admitting: Physician Assistant

## 2014-03-07 MED ORDER — CYCLOBENZAPRINE HCL 10 MG PO TABS: 10.0000 mg | ORAL_TABLET | Freq: Three times a day (TID) | ORAL | Status: DC | PRN

## 2014-03-20 ENCOUNTER — Other Ambulatory Visit: Payer: Self-pay | Admitting: *Deleted

## 2014-03-20 MED ORDER — PREDNISONE 5 MG PO TABS: ORAL_TABLET | ORAL | Status: DC

## 2014-03-21 ENCOUNTER — Other Ambulatory Visit: Payer: Self-pay | Admitting: Physician Assistant

## 2014-03-21 ENCOUNTER — Other Ambulatory Visit: Payer: Self-pay | Admitting: Emergency Medicine

## 2014-03-21 ENCOUNTER — Other Ambulatory Visit: Payer: Self-pay | Admitting: Internal Medicine

## 2014-03-21 MED ORDER — MESALAMINE 800 MG PO TBEC: DELAYED_RELEASE_TABLET | ORAL | Status: DC

## 2014-03-26 ENCOUNTER — Other Ambulatory Visit: Payer: Self-pay | Admitting: *Deleted

## 2014-03-26 MED ORDER — VERAPAMIL HCL ER 240 MG PO TBCR: 240.0000 mg | EXTENDED_RELEASE_TABLET | ORAL | Status: DC

## 2014-05-23 ENCOUNTER — Encounter: Payer: Self-pay | Admitting: Internal Medicine

## 2014-05-23 ENCOUNTER — Ambulatory Visit (INDEPENDENT_AMBULATORY_CARE_PROVIDER_SITE_OTHER): Payer: BC Managed Care – PPO | Admitting: Internal Medicine

## 2014-05-23 VITALS — BP 122/76 | HR 80 | Temp 97.5°F | Resp 16 | Ht 64.58 in | Wt 148.8 lb

## 2014-05-23 DIAGNOSIS — E559 Vitamin D deficiency, unspecified: Secondary | ICD-10-CM

## 2014-05-23 DIAGNOSIS — Z79899 Other long term (current) drug therapy: Secondary | ICD-10-CM

## 2014-05-23 DIAGNOSIS — I10 Essential (primary) hypertension: Secondary | ICD-10-CM

## 2014-05-23 DIAGNOSIS — R7309 Other abnormal glucose: Secondary | ICD-10-CM

## 2014-05-23 DIAGNOSIS — D6851 Activated protein C resistance: Secondary | ICD-10-CM | POA: Insufficient documentation

## 2014-05-23 DIAGNOSIS — K811 Chronic cholecystitis: Secondary | ICD-10-CM

## 2014-05-23 DIAGNOSIS — E782 Mixed hyperlipidemia: Secondary | ICD-10-CM

## 2014-05-23 LAB — CBC WITH DIFFERENTIAL/PLATELET
Basophils Absolute: 0 10*3/uL (ref 0.0–0.1)
Basophils Relative: 0 % (ref 0–1)
Eosinophils Absolute: 0.2 10*3/uL (ref 0.0–0.7)
Eosinophils Relative: 2 % (ref 0–5)
HCT: 38.5 % (ref 36.0–46.0)
HEMOGLOBIN: 13 g/dL (ref 12.0–15.0)
LYMPHS ABS: 2.8 10*3/uL (ref 0.7–4.0)
Lymphocytes Relative: 28 % (ref 12–46)
MCH: 30.4 pg (ref 26.0–34.0)
MCHC: 33.8 g/dL (ref 30.0–36.0)
MCV: 90 fL (ref 78.0–100.0)
MONOS PCT: 14 % — AB (ref 3–12)
Monocytes Absolute: 1.4 10*3/uL — ABNORMAL HIGH (ref 0.1–1.0)
NEUTROS ABS: 5.7 10*3/uL (ref 1.7–7.7)
Neutrophils Relative %: 56 % (ref 43–77)
Platelets: 256 10*3/uL (ref 150–400)
RBC: 4.28 MIL/uL (ref 3.87–5.11)
RDW: 13.6 % (ref 11.5–15.5)
WBC: 10.1 10*3/uL (ref 4.0–10.5)

## 2014-05-23 LAB — HEMOGLOBIN A1C
Hgb A1c MFr Bld: 5.7 % — ABNORMAL HIGH (ref <5.7)
Mean Plasma Glucose: 117 mg/dL — ABNORMAL HIGH (ref <117)

## 2014-05-23 MED ORDER — DULOXETINE HCL 60 MG PO CPEP: ORAL_CAPSULE | ORAL | Status: DC

## 2014-05-23 MED ORDER — HYDROCODONE-ACETAMINOPHEN 5-325 MG PO TABS: ORAL_TABLET | ORAL | Status: DC

## 2014-05-23 NOTE — Patient Instructions (Signed)

## 2014-05-23 NOTE — Progress Notes (Signed)
Patient ID: Carmen Reynolds, female   DOB: [birth year], 63 y.o.   MRN: [id number]   This very nice 63 y.o.MWF presents for 3 month follow up with Hypertension, Hyperlipidemia, Pre-Diabetes and Vitamin D Deficiency. Patient also has hx/o DJD/DDD w/Chronic Low Back Pain and hx/o Ulcerative Colitis.   Patient is treated for HTN & BP has been controlled at home. Today's BP: 122/76 mmHg. Patient has had no complaints of any cardiac type chest pain, palpitations, dyspnea/orthopnea/PND, dizziness, claudication, or dependent edema.   Hyperlipidemia is controlled with diet & meds. Patient denies myalgias or other med SE's. Last Lipids were at goal -  Total Cholesterol , 223*; HDL 102; LDL 89; Triglycerides 158 on 02/19/2014.   Also, the patient has history of Pre Diabetes with A1c 5.7% in Oct 2013 and most recently A1c 5.9% on 02/19/2014.    Further, the patient also has history of Vitamin D Deficiency (28 in 2008) and supplements vitamin D without any suspected side-effects. Last vitamin D was  86 on 02/19/2014.   Medication List   BENICAR 40 MG tablet  Generic drug:  olmesartan  TAKE 1 TABLET DAILY FOR    BLOOD PRESSURE     CALCIUM 1200 PO  Take 2,400 mg by mouth daily.     cholecalciferol 1000 UNITS tablet  Commonly known as:  VITAMIN D  Take 3,000 Units by mouth daily.     CITRUCEL 500 MG Tabs  Generic drug:  Methylcellulose (Laxative)  Take 1,000 mg by mouth daily.     Co Q 10 100 MG Caps  Take 200 mg by mouth daily. Quol Ultra CoQ10 110m     cyclobenzaprine 10 MG tablet  Commonly known as:  FLEXERIL  Take 1 tablet (10 mg total) by mouth every 8 (eight) hours as needed for muscle spasms.     DULoxetine 60 MG capsule  Commonly known as:  CYMBALTA  Take 1 capsule daily for mood and pain     esomeprazole 40 MG capsule  Commonly known as:  NEXIUM  Take 1 capsule (40 mg total) by mouth daily.     fexofenadine 180 MG tablet  Commonly known as:  ALLEGRA  Take 180 mg by mouth as needed for  allergies.     HYDROcodone-acetaminophen 5-325 MG per tablet  Commonly known as:  NORCO/VICODIN  Take 1/2 to 1 tablet every 3 to 4 hours if needed for severe pain     IMODIUM PO  Take 1 tablet by mouth daily as needed (loose stools). May take up to 6 tablets daily     Mesalamine 800 MG Tbec  Take 2 tablets daily for colitis     OVER THE COUNTER MEDICATION  Take 300 mg by mouth daily. Mega red 3068m    OVER THE COUNTER MEDICATION  Take 625 mg by mouth daily. Mega Food Complex C     predniSONE 5 MG tablet  Commonly known as:  DELTASONE  Take 1 tablet tid or as directed.     Red Yeast Rice Extract 600 MG Tabs  Take 1,200 mg by mouth 2 (two) times daily.     verapamil 240 MG CR tablet  Commonly known as:  CALAN-SR  Take 1 tablet (240 mg total) by mouth 1 day or 1 dose.     Allergies  Allergen Reactions  . Lyrica [Pregabalin] Swelling    Swelling of feet.  . Augmentin [Amoxicillin-Pot Clavulanate] Diarrhea  . Lactose Intolerance (Gi)   . Meloxicam Other (See Comments)  Intestinal Bleed  . Other     Salt-sensitivity Grain-sensitivity Artificial sweetners-sensitivity Sugar-sensitivity  . Quinapril   . Vasotec [Enalaprilat] Cough  . Wheat Bran     Wheat-sensitivity   PMHx:   Past Medical History  Diagnosis Date  . History of fusion of cervical spine t  . Fusion of spine of lumbar region t  . Lactose intolerance   . Adult celiac disease   . Fibromyalgia   . Hypertension   . GERD (gastroesophageal reflux disease)   . Proctitis   . Ulcerative colitis   . Diverticulitis   . Factor V Leiden   . Concussion     fell jumping off of horse in past 6 months- 09/14/12  . Hyperlipidemia   . DJD (degenerative joint disease)   . DDD (degenerative disc disease)   . Spinal stenosis   . Scoliosis   . Vitamin D deficiency    Immunization History  Administered Date(s) Administered  . Pneumococcal Polysaccharide-23 09/26/2012   Past Surgical History  Procedure  Laterality Date  . Tubal ligation  1983  . Foot surgery Right 1978    Reconstruction after faliure of emergency stitching  . Neck surgery  2002    anterior cervical fusion  . Lasik    . Eye surgery    . Anterior lateral lumbar fusion 4 levels N/A 09/22/2012    Procedure: ANTERIOR LATERAL LUMBAR FUSION 4 LEVELS;  Surgeon: Erline Levine, MD;  Location: [facility];  Service: Neurosurgery;  Laterality: N/A;  Thoracic Twelve Lumbar One,Lumbar One-TwoAnterolateral decompression/fusion/percutaneous percutaneous pedicle screws   . Chest tube insertion Left 09/22/2012    Procedure: CHEST TUBE INSERTION;  Surgeon: Erline Levine, MD;  Location: [facility];  Service: Neurosurgery;  Laterality: Left;  Insertion per Dr. Cyndia Bent  . Back surgery  2006, 2008    . disectomy  . Cholecystectomy N/A 11/22/2013    Procedure: LAPAROSCOPIC CHOLECYSTECTOMY ;  Surgeon: Harl Bowie, MD;  Location: [place] [facility];  Service: General;  Laterality: N/A;   FHx:    Reviewed / unchanged  SHx:    Reviewed / unchanged  Systems Review:  Constitutional: Denies fever, chills, wt changes, headaches, insomnia, fatigue, night sweats, change in appetite. Eyes: Denies redness, blurred vision, diplopia, discharge, itchy, watery eyes.  ENT: Denies discharge, congestion, post nasal drip, epistaxis, sore throat, earache, hearing loss, dental pain, tinnitus, vertigo, sinus pain, snoring.  CV: Denies chest pain, palpitations, irregular heartbeat, syncope, dyspnea, diaphoresis, orthopnea, PND, claudication or edema. Respiratory: denies cough, dyspnea, DOE, pleurisy, hoarseness, laryngitis, wheezing.  Gastrointestinal: Denies dysphagia, odynophagia, heartburn, reflux, water brash, abdominal pain or cramps, nausea, vomiting, bloating, diarrhea, constipation, hematemesis, melena, hematochezia  or hemorrhoids. Genitourinary: Denies dysuria, frequency, urgency, nocturia, hesitancy, discharge, hematuria or flank pain. Musculoskeletal: Denies  arthralgias, myalgias, stiffness, jt. swelling, pain, limping or strain/sprain.  Skin: Denies pruritus, rash, hives, warts, acne, eczema or change in skin lesion(s). Neuro: No weakness, tremor, incoordination, spasms, paresthesia or pain. Psychiatric: Denies confusion, memory loss or sensory loss. Endo: Denies change in weight, skin or hair change.  Heme/Lymph: No excessive bleeding, bruising or enlarged lymph nodes.  Exam:  BP 122/76  Pulse 80  Temp(Src) 97.5 F (36.4 C) (Temporal)  Resp 16  Ht 5' 4.58" (1.64 m)  Wt 148 lb 12.8 oz (67.495 kg)  BMI 25.09 kg/m2  Appears well nourished and in no distress. Eyes: PERRLA, EOMs, conjunctiva no swelling or erythema. Sinuses: No frontal/maxillary tenderness ENT/Mouth: EAC's clear, TM's nl w/o erythema, bulging. Nares clear  w/o erythema, swelling, exudates. Oropharynx clear without erythema or exudates. Oral hygiene is good. Tongue normal, non obstructing. Hearing intact.  Neck: Supple. Thyroid nl. Car 2+/2+ without bruits, nodes or JVD. Chest: Respirations nl with BS clear & equal w/o rales, rhonchi, wheezing or stridor.  Cor: Heart sounds normal w/ regular rate and rhythm without sig. murmurs, gallops, clicks, or rubs. Peripheral pulses normal and equal  without edema.  Musculoskeletal: Full ROM all peripheral extremities, joint stability, 5/5 strength, and normal gait.  Skin: Warm, dry without exposed rashes, lesions or ecchymosis apparent.  Neuro: Cranial nerves intact, reflexes equal bilaterally. Sensory-motor testing grossly intact. Tendon reflexes grossly intact.  Pysch: Alert & oriented x 3.  Insight and judgement nl & appropriate. No ideations.  Assessment and Plan:  1. Hypertension - Continue monitor blood pressure at home. Continue diet/meds same.  2. Hyperlipidemia - Continue diet/meds, exercise,& lifestyle modifications. Continue monitor periodic cholesterol/liver & renal functions   3. Pre-Diabetes - Continue diet, exercise,  lifestyle modifications. Monitor appropriate labs.  4. Vitamin D Deficiency - Continue supplementation.   Recommended regular exercise, BP monitoring, weight control, and discussed med and SE's. Recommended labs to assess and monitor clinical status. Further disposition pending results of labs.

## 2014-05-24 LAB — HEPATIC FUNCTION PANEL
ALK PHOS: 100 U/L (ref 39–117)
ALT: 25 U/L (ref 0–35)
AST: 25 U/L (ref 0–37)
Albumin: 5 g/dL (ref 3.5–5.2)
BILIRUBIN DIRECT: 0.1 mg/dL (ref 0.0–0.3)
BILIRUBIN INDIRECT: 0.5 mg/dL (ref 0.2–1.2)
Total Bilirubin: 0.6 mg/dL (ref 0.2–1.2)
Total Protein: 7.3 g/dL (ref 6.0–8.3)

## 2014-05-24 LAB — LIPID PANEL
Cholesterol: 250 mg/dL — ABNORMAL HIGH (ref 0–200)
HDL: 124 mg/dL (ref >39)
LDL Cholesterol: 108 mg/dL — ABNORMAL HIGH (ref 0–99)
Total CHOL/HDL Ratio: 2 Ratio
Triglycerides: 88 mg/dL (ref <150)
VLDL: 18 mg/dL (ref 0–40)

## 2014-05-24 LAB — BASIC METABOLIC PANEL WITH GFR
BUN: 15 mg/dL (ref 6–23)
CALCIUM: 10.1 mg/dL (ref 8.4–10.5)
CHLORIDE: 99 meq/L (ref 96–112)
CO2: 27 meq/L (ref 19–32)
Creat: 0.71 mg/dL (ref 0.50–1.10)
GFR, Est African American: >89 mL/min
GFR, Est Non African American: >89 mL/min
GLUCOSE: 94 mg/dL (ref 70–99)
POTASSIUM: 4.5 meq/L (ref 3.5–5.3)
SODIUM: 138 meq/L (ref 135–145)

## 2014-05-24 LAB — TSH: TSH: 2.354 u[IU]/mL (ref 0.350–4.500)

## 2014-05-24 LAB — VITAMIN D 25 HYDROXY (VIT D DEFICIENCY, FRACTURES): VIT D 25 HYDROXY: 87 ng/mL (ref 30–89)

## 2014-05-24 LAB — INSULIN, FASTING: Insulin fasting, serum: 4.5 u[IU]/mL (ref 2.0–19.6)

## 2014-05-24 LAB — MAGNESIUM: MAGNESIUM: 1.9 mg/dL (ref 1.5–2.5)

## 2014-05-27 ENCOUNTER — Encounter (HOSPITAL_COMMUNITY): Payer: Self-pay | Admitting: *Deleted

## 2014-05-27 ENCOUNTER — Emergency Department (HOSPITAL_COMMUNITY)
Admission: EM | Admit: 2014-05-27 | Discharge: 2014-05-28 | Disposition: A | Discharge status: Home / Self Care | Payer: BC Managed Care – PPO | Attending: Emergency Medicine | Admitting: Emergency Medicine

## 2014-05-27 DIAGNOSIS — Y9389 Activity, other specified: Secondary | ICD-10-CM | POA: Insufficient documentation

## 2014-05-27 DIAGNOSIS — Z79899 Other long term (current) drug therapy: Secondary | ICD-10-CM | POA: Diagnosis not present

## 2014-05-27 DIAGNOSIS — Z862 Personal history of diseases of the blood and blood-forming organs and certain disorders involving the immune mechanism: Secondary | ICD-10-CM | POA: Insufficient documentation

## 2014-05-27 DIAGNOSIS — K219 Gastro-esophageal reflux disease without esophagitis: Secondary | ICD-10-CM | POA: Diagnosis not present

## 2014-05-27 DIAGNOSIS — F10920 Alcohol use, unspecified with intoxication, uncomplicated: Secondary | ICD-10-CM

## 2014-05-27 DIAGNOSIS — Z981 Arthrodesis status: Secondary | ICD-10-CM | POA: Insufficient documentation

## 2014-05-27 DIAGNOSIS — Y92410 Unspecified street and highway as the place of occurrence of the external cause: Secondary | ICD-10-CM | POA: Diagnosis not present

## 2014-05-27 DIAGNOSIS — E559 Vitamin D deficiency, unspecified: Secondary | ICD-10-CM | POA: Insufficient documentation

## 2014-05-27 DIAGNOSIS — Z87891 Personal history of nicotine dependence: Secondary | ICD-10-CM | POA: Insufficient documentation

## 2014-05-27 DIAGNOSIS — M1990 Unspecified osteoarthritis, unspecified site: Secondary | ICD-10-CM | POA: Insufficient documentation

## 2014-05-27 DIAGNOSIS — I10 Essential (primary) hypertension: Secondary | ICD-10-CM | POA: Diagnosis not present

## 2014-05-27 DIAGNOSIS — F10129 Alcohol abuse with intoxication, unspecified: Secondary | ICD-10-CM | POA: Diagnosis not present

## 2014-05-27 DIAGNOSIS — Z043 Encounter for examination and observation following other accident: Secondary | ICD-10-CM | POA: Insufficient documentation

## 2014-05-27 LAB — BASIC METABOLIC PANEL
ANION GAP: 15 (ref 5–15)
BUN: 14 mg/dL (ref 6–23)
CALCIUM: 9.4 mg/dL (ref 8.4–10.5)
CHLORIDE: 94 meq/L — AB (ref 96–112)
CO2: 25 mEq/L (ref 19–32)
CREATININE: 0.65 mg/dL (ref 0.50–1.10)
GFR calc Af Amer: >90 mL/min (ref >90)
GFR calc non Af Amer: >90 mL/min (ref >90)
Glucose, Bld: 98 mg/dL (ref 70–99)
Potassium: 4.1 mEq/L (ref 3.7–5.3)
SODIUM: 134 meq/L — AB (ref 137–147)

## 2014-05-27 LAB — CBC WITH DIFFERENTIAL/PLATELET
BASOS ABS: 0 10*3/uL (ref 0.0–0.1)
BASOS PCT: 0 % (ref 0–1)
Eosinophils Absolute: 0.1 10*3/uL (ref 0.0–0.7)
Eosinophils Relative: 1 % (ref 0–5)
HEMATOCRIT: 35.8 % — AB (ref 36.0–46.0)
Hemoglobin: 12.1 g/dL (ref 12.0–15.0)
Lymphocytes Relative: 23 % (ref 12–46)
Lymphs Abs: 2.2 10*3/uL (ref 0.7–4.0)
MCH: 29.9 pg (ref 26.0–34.0)
MCHC: 33.8 g/dL (ref 30.0–36.0)
MCV: 88.4 fL (ref 78.0–100.0)
MONO ABS: 0.7 10*3/uL (ref 0.1–1.0)
Monocytes Relative: 8 % (ref 3–12)
NEUTROS ABS: 6.6 10*3/uL (ref 1.7–7.7)
Neutrophils Relative %: 68 % (ref 43–77)
Platelets: 261 10*3/uL (ref 150–400)
RBC: 4.05 MIL/uL (ref 3.87–5.11)
RDW: 13.5 % (ref 11.5–15.5)
WBC: 9.7 10*3/uL (ref 4.0–10.5)

## 2014-05-27 LAB — URINALYSIS, ROUTINE W REFLEX MICROSCOPIC
Bilirubin Urine: NEGATIVE
Glucose, UA: NEGATIVE mg/dL
Hgb urine dipstick: NEGATIVE
Ketones, ur: NEGATIVE mg/dL
Nitrite: NEGATIVE
PH: 5.5 (ref 5.0–8.0)
Protein, ur: NEGATIVE mg/dL
SPECIFIC GRAVITY, URINE: 1.007 (ref 1.005–1.030)
Urobilinogen, UA: 0.2 mg/dL (ref 0.0–1.0)

## 2014-05-27 LAB — URINE MICROSCOPIC-ADD ON

## 2014-05-27 LAB — RAPID URINE DRUG SCREEN, HOSP PERFORMED
Amphetamines: NOT DETECTED
BARBITURATES: NOT DETECTED
Benzodiazepines: NOT DETECTED
Cocaine: NOT DETECTED
Opiates: NOT DETECTED
Tetrahydrocannabinol: NOT DETECTED

## 2014-05-27 LAB — ETHANOL: ALCOHOL ETHYL (B): 266 mg/dL — AB (ref 0–11)

## 2014-05-27 NOTE — ED Notes (Signed)
Pt ambulated to bathroom 

## 2014-05-27 NOTE — ED Notes (Signed)
Pt remains monitored by blood pressure, pulse ox, and 12 lead.  

## 2014-05-27 NOTE — ED Notes (Signed)
Phlebotomy at bedside.

## 2014-05-27 NOTE — ED Notes (Signed)
MD Wofford at bedside.
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
## 2014-05-27 NOTE — ED Notes (Signed)
Pt's husband at bedside.
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
## 2014-05-27 NOTE — ED Notes (Signed)
Pt given a [place] sandwich with MD approval.

## 2014-05-28 NOTE — ED Notes (Signed)
Discharge instructions given  Voiced understanding.
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
## 2014-05-30 ENCOUNTER — Encounter: Payer: Self-pay | Admitting: Physician Assistant

## 2014-05-30 ENCOUNTER — Ambulatory Visit (INDEPENDENT_AMBULATORY_CARE_PROVIDER_SITE_OTHER): Payer: BC Managed Care – PPO | Admitting: Physician Assistant

## 2014-05-30 VITALS — BP 120/70 | HR 72 | Temp 97.7°F | Resp 16 | Wt 149.0 lb

## 2014-05-30 DIAGNOSIS — Z79899 Other long term (current) drug therapy: Secondary | ICD-10-CM

## 2014-05-30 DIAGNOSIS — R2689 Other abnormalities of gait and mobility: Secondary | ICD-10-CM

## 2014-05-30 DIAGNOSIS — R413 Other amnesia: Secondary | ICD-10-CM

## 2014-05-30 DIAGNOSIS — R55 Syncope and collapse: Secondary | ICD-10-CM

## 2014-05-30 LAB — BASIC METABOLIC PANEL WITH GFR
BUN: 12 mg/dL (ref 6–23)
CALCIUM: 9.3 mg/dL (ref 8.4–10.5)
CO2: 30 mEq/L (ref 19–32)
Chloride: 100 mEq/L (ref 96–112)
Creat: 0.75 mg/dL (ref 0.50–1.10)
GFR, EST NON AFRICAN AMERICAN: 84 mL/min
GFR, Est African American: >89 mL/min
Glucose, Bld: 85 mg/dL (ref 70–99)
Potassium: 4.3 mEq/L (ref 3.5–5.3)
Sodium: 138 mEq/L (ref 135–145)

## 2014-05-30 LAB — HEPATIC FUNCTION PANEL
ALBUMIN: 4.5 g/dL (ref 3.5–5.2)
ALT: 27 U/L (ref 0–35)
AST: 22 U/L (ref 0–37)
Alkaline Phosphatase: 84 U/L (ref 39–117)
BILIRUBIN TOTAL: 0.5 mg/dL (ref 0.2–1.2)
Bilirubin, Direct: 0.1 mg/dL (ref 0.0–0.3)
Indirect Bilirubin: 0.4 mg/dL (ref 0.2–1.2)
Total Protein: 6.5 g/dL (ref 6.0–8.3)

## 2014-05-30 LAB — MAGNESIUM: Magnesium: 1.7 mg/dL (ref 1.5–2.5)

## 2014-05-30 LAB — TSH: TSH: 2.389 u[IU]/mL (ref 0.350–4.500)
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
## 2014-05-30 NOTE — Patient Instructions (Signed)

## 2014-05-31 LAB — D-DIMER, QUANTITATIVE (NOT AT ARMC): D DIMER QUANT: 0.56 ug{FEU}/mL — AB (ref 0.00–0.48)

## 2014-06-07 ENCOUNTER — Other Ambulatory Visit: Payer: BC Managed Care – PPO

## 2014-06-11 ENCOUNTER — Ambulatory Visit
Admission: RE | Admit: 2014-06-11 | Discharge: 2014-06-11 | Disposition: A | Discharge status: Home / Self Care | Payer: BC Managed Care – PPO | Source: Ambulatory Visit | Attending: Physician Assistant | Admitting: Physician Assistant

## 2014-06-11 ENCOUNTER — Other Ambulatory Visit: Payer: Self-pay | Admitting: Physician Assistant

## 2014-06-11 ENCOUNTER — Other Ambulatory Visit: Payer: Self-pay | Admitting: Internal Medicine

## 2014-06-11 DIAGNOSIS — R2689 Other abnormalities of gait and mobility: Secondary | ICD-10-CM

## 2014-06-11 DIAGNOSIS — R413 Other amnesia: Secondary | ICD-10-CM

## 2014-06-11 DIAGNOSIS — R55 Syncope and collapse: Secondary | ICD-10-CM

## 2014-06-11 MED ORDER — CYCLOBENZAPRINE HCL 10 MG PO TABS: ORAL_TABLET | ORAL | Status: AC

## 2014-06-14 ENCOUNTER — Telehealth: Payer: Self-pay

## 2014-06-14 NOTE — Telephone Encounter (Signed)
Patient aware of MRI results, MRI performed 06-11-2014, Normal, patient verbalized understating

## 2014-06-15 ENCOUNTER — Other Ambulatory Visit: Payer: Self-pay | Admitting: Emergency Medicine

## 2014-06-15 DIAGNOSIS — R413 Other amnesia: Secondary | ICD-10-CM

## 2014-07-30 ENCOUNTER — Other Ambulatory Visit: Payer: Self-pay | Admitting: Internal Medicine

## 2014-07-30 DIAGNOSIS — K811 Chronic cholecystitis: Secondary | ICD-10-CM

## 2014-07-30 MED ORDER — MESALAMINE 800 MG PO TBEC
DELAYED_RELEASE_TABLET | ORAL | Status: DC
Start: 2014-07-30 — End: 2014-08-20

## 2014-08-03 ENCOUNTER — Encounter: Payer: Self-pay | Admitting: Neurology

## 2014-08-03 ENCOUNTER — Ambulatory Visit (INDEPENDENT_AMBULATORY_CARE_PROVIDER_SITE_OTHER): Payer: BLUE CROSS/BLUE SHIELD | Admitting: Neurology

## 2014-08-03 VITALS — BP 116/76 | HR 76 | Resp 16 | Ht 65.0 in | Wt 123.0 lb

## 2014-08-03 DIAGNOSIS — R413 Other amnesia: Secondary | ICD-10-CM

## 2014-08-03 DIAGNOSIS — R404 Transient alteration of awareness: Secondary | ICD-10-CM

## 2014-08-03 NOTE — Patient Instructions (Addendum)
1. Schedule routine EEG 2. Reduce Alcohol Intake  3. Follow-Up As Needed
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
## 2014-08-06 ENCOUNTER — Other Ambulatory Visit: Payer: Self-pay | Admitting: Physician Assistant

## 2014-08-06 ENCOUNTER — Ambulatory Visit (INDEPENDENT_AMBULATORY_CARE_PROVIDER_SITE_OTHER): Payer: BLUE CROSS/BLUE SHIELD | Admitting: Neurology

## 2014-08-06 ENCOUNTER — Encounter: Payer: Self-pay | Admitting: Physician Assistant

## 2014-08-06 DIAGNOSIS — R404 Transient alteration of awareness: Secondary | ICD-10-CM

## 2014-08-06 MED ORDER — SULFASALAZINE 500 MG PO TABS: 500.0000 mg | ORAL_TABLET | Freq: Two times a day (BID) | ORAL | Status: DC

## 2014-08-08 ENCOUNTER — Telehealth: Payer: Self-pay | Admitting: Neurology

## 2014-08-08 NOTE — Telephone Encounter (Signed)
Pt wants the results from the EEG please call her at [phone]

## 2014-08-08 NOTE — Telephone Encounter (Signed)
Called patient to let her knoww EEG was normal.

## 2014-08-08 NOTE — Telephone Encounter (Signed)
Spoke with patient let her know eeg hasn't been resulted yet, but when it is we will give her a call with the results.
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
## 2014-08-09 ENCOUNTER — Encounter: Payer: Self-pay | Admitting: Neurology

## 2014-08-20 ENCOUNTER — Other Ambulatory Visit: Payer: Self-pay | Admitting: Internal Medicine

## 2014-08-20 MED ORDER — MESALAMINE 1000 MG RE SUPP: 1000.0000 mg | Freq: Every day | RECTAL | Status: DC

## 2014-08-23 ENCOUNTER — Ambulatory Visit (INDEPENDENT_AMBULATORY_CARE_PROVIDER_SITE_OTHER): Payer: BLUE CROSS/BLUE SHIELD | Admitting: Physician Assistant

## 2014-08-23 ENCOUNTER — Encounter: Payer: Self-pay | Admitting: Physician Assistant

## 2014-08-23 VITALS — BP 102/60 | HR 98 | Temp 98.0°F | Resp 18 | Ht 64.5 in | Wt 148.0 lb

## 2014-08-23 DIAGNOSIS — Z79899 Other long term (current) drug therapy: Secondary | ICD-10-CM

## 2014-08-23 DIAGNOSIS — E782 Mixed hyperlipidemia: Secondary | ICD-10-CM

## 2014-08-23 DIAGNOSIS — R7303 Prediabetes: Secondary | ICD-10-CM

## 2014-08-23 DIAGNOSIS — I10 Essential (primary) hypertension: Secondary | ICD-10-CM

## 2014-08-23 DIAGNOSIS — E559 Vitamin D deficiency, unspecified: Secondary | ICD-10-CM

## 2014-08-23 DIAGNOSIS — R7309 Other abnormal glucose: Secondary | ICD-10-CM

## 2014-08-23 LAB — CBC WITH DIFFERENTIAL/PLATELET
BASOS ABS: 0.1 10*3/uL (ref 0.0–0.1)
Basophils Relative: 1 % (ref 0–1)
Eosinophils Absolute: 0.2 10*3/uL (ref 0.0–0.7)
Eosinophils Relative: 2 % (ref 0–5)
HCT: 38.2 % (ref 36.0–46.0)
HEMOGLOBIN: 12.5 g/dL (ref 12.0–15.0)
LYMPHS ABS: 2.3 10*3/uL (ref 0.7–4.0)
LYMPHS PCT: 26 % (ref 12–46)
MCH: 29.4 pg (ref 26.0–34.0)
MCHC: 32.7 g/dL (ref 30.0–36.0)
MCV: 89.9 fL (ref 78.0–100.0)
MPV: 9.2 fL (ref 8.6–12.4)
Monocytes Absolute: 1 10*3/uL (ref 0.1–1.0)
Monocytes Relative: 11 % (ref 3–12)
NEUTROS ABS: 5.2 10*3/uL (ref 1.7–7.7)
NEUTROS PCT: 60 % (ref 43–77)
Platelets: 295 10*3/uL (ref 150–400)
RBC: 4.25 MIL/uL (ref 3.87–5.11)
RDW: 13.2 % (ref 11.5–15.5)
WBC: 8.7 10*3/uL (ref 4.0–10.5)

## 2014-08-23 MED ORDER — AZITHROMYCIN 250 MG PO TABS: ORAL_TABLET | ORAL | Status: AC
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
## 2014-08-23 NOTE — Patient Instructions (Addendum)
-  Continue medications as prescribed. Take Z-Pak if needed.   Please keep appt in June 2016 for physical.   GIVE PT FOOD CHOICE LISTS FOR MEAL PLANNING  1)The amount of food you eat is important  -Too much can increase glucose levels and cause you to gain weight (which can  also increase glucose levels.  -Too little can decrease glucose level to unsafe levels (<70) 2)Eat meals and snacks at the same time each day to help your diabetes medication help you.  If you eat at different times each day, then the medication will not be as effective. 3)Do NOT skip meals or eat meals later than usual.  If you skip meals, then your glucose level can go low (<70).  Eating meals later than usual will not help the medication work effectively. 4) Amount of carbohydrates (carbs) per meal  -Breakfast- 30-45 grams of carbs  -Lunch and Dinner- 45-60 grams of carbs  -Snacks- 15-30 grams of carbs 5)Low fat foods have no more than 3 grams of fat per serving.  -Saturated- look for less than 1 gram per serving  -Trans Fat- look for 0 grams per serving 6)Exercise at least 120 minutes per week  Exercise Benefits:   -Lower LDL (Bad cholesterol)   -Lower blood pressure   -Increase HDL (Good cholesterol)   -Strengthen heart, lungs and muscles   -Burn calories and relieve stress   -Sleep better and help you feel better overall  To lower your risk of heart disease, limit your intake of saturated fat and trans fat as much as possible. THE GOOD WHAT IT DOES WHERE IT'S FOUND  MONOUNSATURATED FAT Lowers LDL and maybe raises HDL cholesterol Canola oil, olives, olive oil, peanuts, peanut oil, avocados, nuts  POLYUNSATURATED FAT Lowers LDL cholesterol Corn, safflower, sunflower and soybean oils, nuts, seeds  OMEGA-3 FATTY ACIDS Lowers triglycerides (blood fats) and blood pressure Salmon, mackerel, herring, sardines, flax seed, flaxseed oil, walnuts, soybean oil  THE BAD WHAT IT DOES WHERE IT'S FOUND  SATURATED FAT Raises  LDL (bad) cholesterol Butter, shortening, lard, red meat, cheese, whole milk, ice cream, coconut and palm oils  TRANS FAT Raises LDL cholesterol, lowers HDL (good) cholesterol Fried foods, some stick margarines, some cookies and crackers (look for hydrogenated fat on the ingredient list)  CHOLESTEROL FROM FOOD Too much may raise cholesterol levels Meat, poultry, seafood, eggs, milk, cheese, yogurt, butter   Plate Method (How much food of each food group) 1) Fill one half of your plate with nonstarchy vegetables: lettuce, broccoli, green beans, spinach, carrots or peppers. 2) Fill one quarter with protein: chicken, [place], fish, lean meat, eggs or tofu. 3) Fill one quarter with a nutritious carbohydrate food: brown rice, whole-wheat pasta, whole-wheat bread, peas, or corn.  Choose whole-wheat carbs for extra nutrition.  Controlling carbs helps you control your blood glucose. 4) Include a small piece of fruit at each meal, as well as 8 ounces of lowfat milk or yogurt. 5) Add 1-2 teaspoons of heart-healthy fat, such as olive or canola oil, trans fat-free margarine, avocado, nuts or seeds.

## 2014-08-24 LAB — BASIC METABOLIC PANEL WITH GFR
BUN: 15 mg/dL (ref 6–23)
CHLORIDE: 101 meq/L (ref 96–112)
CO2: 29 mEq/L (ref 19–32)
Calcium: 9.8 mg/dL (ref 8.4–10.5)
Creat: 0.86 mg/dL (ref 0.50–1.10)
GFR, Est African American: 82 mL/min
GFR, Est Non African American: 71 mL/min
Glucose, Bld: 97 mg/dL (ref 70–99)
Potassium: 3.9 mEq/L (ref 3.5–5.3)
SODIUM: 139 meq/L (ref 135–145)

## 2014-08-24 LAB — HEPATIC FUNCTION PANEL
ALBUMIN: 4.4 g/dL (ref 3.5–5.2)
ALT: 12 U/L (ref 0–35)
AST: 20 U/L (ref 0–37)
Alkaline Phosphatase: 74 U/L (ref 39–117)
BILIRUBIN INDIRECT: 0.3 mg/dL (ref 0.2–1.2)
BILIRUBIN TOTAL: 0.4 mg/dL (ref 0.2–1.2)
Bilirubin, Direct: 0.1 mg/dL (ref 0.0–0.3)
Total Protein: 6.6 g/dL (ref 6.0–8.3)

## 2014-08-24 LAB — HEMOGLOBIN A1C
Hgb A1c MFr Bld: 5.7 % — ABNORMAL HIGH (ref <5.7)
Mean Plasma Glucose: 117 mg/dL — ABNORMAL HIGH (ref <117)

## 2014-08-24 LAB — LIPID PANEL
CHOL/HDL RATIO: 2 ratio
Cholesterol: 198 mg/dL (ref 0–200)
HDL: 97 mg/dL (ref >39)
LDL CALC: 64 mg/dL (ref 0–99)
TRIGLYCERIDES: 184 mg/dL — AB (ref <150)
VLDL: 37 mg/dL (ref 0–40)

## 2014-08-24 LAB — VITAMIN D 25 HYDROXY (VIT D DEFICIENCY, FRACTURES): Vit D, 25-Hydroxy: 54 ng/mL (ref 30–100)

## 2014-08-24 LAB — INSULIN, FASTING: Insulin fasting, serum: 17.1 u[IU]/mL (ref 2.0–19.6)

## 2014-08-24 NOTE — Addendum Note (Signed)
Addended by: Charolette Forward on: 08/24/2014 09:29 AM   Modules accepted: Medications

## 2014-09-24 ENCOUNTER — Other Ambulatory Visit: Payer: Self-pay | Admitting: *Deleted

## 2014-09-24 MED ORDER — VERAPAMIL HCL ER 240 MG PO TBCR: 240.0000 mg | EXTENDED_RELEASE_TABLET | Freq: Every day | ORAL | Status: DC

## 2014-10-01 ENCOUNTER — Encounter: Payer: Self-pay | Admitting: Internal Medicine

## 2014-10-01 ENCOUNTER — Other Ambulatory Visit: Payer: Self-pay | Admitting: Internal Medicine

## 2014-10-01 DIAGNOSIS — I10 Essential (primary) hypertension: Secondary | ICD-10-CM

## 2014-10-01 MED ORDER — VERAPAMIL HCL ER 240 MG PO TBCR: EXTENDED_RELEASE_TABLET | ORAL | Status: DC

## 2014-10-01 MED ORDER — VERAPAMIL HCL ER 240 MG PO CP24: 240.0000 mg | ORAL_CAPSULE | Freq: Every day | ORAL | Status: DC

## 2014-10-02 ENCOUNTER — Other Ambulatory Visit: Payer: Self-pay

## 2014-10-02 ENCOUNTER — Telehealth: Payer: Self-pay

## 2014-10-02 MED ORDER — VERAPAMIL HCL ER 240 MG PO CP24: 240.0000 mg | ORAL_CAPSULE | Freq: Every day | ORAL | Status: DC

## 2014-10-02 NOTE — Telephone Encounter (Signed)
Called [facility] and spoke with Izora Gala she added note to account to not dispense tablets but capsules only

## 2014-10-02 NOTE — Telephone Encounter (Signed)
Resent correct Verapamil RX to pharmacy

## 2014-10-02 NOTE — Telephone Encounter (Signed)
Return called to patient and updated her phone number (we had husbands as primary, he gave me correct number), Received a paper note from front office staff, patient advised that we sent in Verapamil tablets verses caplets, checked the chart history and it appears that both Dr Melford Aase and Estill Bamberg sent in Carmen Reynolds , I resent correctly this am as Capsule based on patients RX history, called patient and apologized for the confusion.

## 2014-11-21 ENCOUNTER — Encounter: Payer: Self-pay | Admitting: Internal Medicine

## 2014-12-28 ENCOUNTER — Encounter: Payer: Self-pay | Admitting: Internal Medicine

## 2015-01-04 ENCOUNTER — Encounter: Payer: Self-pay | Admitting: Internal Medicine

## 2015-01-04 ENCOUNTER — Ambulatory Visit (INDEPENDENT_AMBULATORY_CARE_PROVIDER_SITE_OTHER): Payer: BLUE CROSS/BLUE SHIELD | Admitting: Internal Medicine

## 2015-01-04 VITALS — BP 118/82 | HR 84 | Temp 97.8°F | Resp 16 | Ht 63.5 in | Wt 146.0 lb

## 2015-01-04 DIAGNOSIS — E559 Vitamin D deficiency, unspecified: Secondary | ICD-10-CM

## 2015-01-04 DIAGNOSIS — D688 Other specified coagulation defects: Secondary | ICD-10-CM

## 2015-01-04 DIAGNOSIS — R7309 Other abnormal glucose: Secondary | ICD-10-CM

## 2015-01-04 DIAGNOSIS — K219 Gastro-esophageal reflux disease without esophagitis: Secondary | ICD-10-CM

## 2015-01-04 DIAGNOSIS — E782 Mixed hyperlipidemia: Secondary | ICD-10-CM

## 2015-01-04 DIAGNOSIS — Z9181 History of falling: Secondary | ICD-10-CM

## 2015-01-04 DIAGNOSIS — E538 Deficiency of other specified B group vitamins: Secondary | ICD-10-CM

## 2015-01-04 DIAGNOSIS — I10 Essential (primary) hypertension: Secondary | ICD-10-CM

## 2015-01-04 DIAGNOSIS — N182 Chronic kidney disease, stage 2 (mild): Secondary | ICD-10-CM | POA: Insufficient documentation

## 2015-01-04 DIAGNOSIS — D509 Iron deficiency anemia, unspecified: Secondary | ICD-10-CM

## 2015-01-04 DIAGNOSIS — Z79899 Other long term (current) drug therapy: Secondary | ICD-10-CM

## 2015-01-04 DIAGNOSIS — R7303 Prediabetes: Secondary | ICD-10-CM

## 2015-01-04 DIAGNOSIS — Z1331 Encounter for screening for depression: Secondary | ICD-10-CM

## 2015-01-04 DIAGNOSIS — D6851 Activated protein C resistance: Secondary | ICD-10-CM

## 2015-01-04 DIAGNOSIS — Z1212 Encounter for screening for malignant neoplasm of rectum: Secondary | ICD-10-CM

## 2015-01-04 DIAGNOSIS — K5190 Ulcerative colitis, unspecified, without complications: Secondary | ICD-10-CM

## 2015-01-04 LAB — VITAMIN B12: Vitamin B-12: 944 pg/mL — ABNORMAL HIGH (ref 211–911)

## 2015-01-04 LAB — TSH: TSH: 2.383 u[IU]/mL (ref 0.350–4.500)

## 2015-01-04 NOTE — Progress Notes (Signed)
Patient ID: Carmen Reynolds, female   DOB: July 15, 1951, 64 y.o.   MRN: [id number]  Annual Comprehensive Examination  This very nice 64 y.o. MWF presents for complete physical.  Patient has been followed for HTN, Prediabetes, Hyperlipidemia, and Vitamin D Deficiency. Other significant problems is that of DDD with patient having undergone Cx Disk surg x 1 & Lumbar Disk surg x 3 and last surgery was in 2014 by Dr Vertell Limber and patient has chronic pain. Also she has Ulcerative Colitis and is followed by Dr Collene Mares.     HTN predates since 1997. Patient's BP has been controlled at home and patient denies any cardiac symptoms as chest pain, palpitations, shortness of breath, dizziness or ankle swelling. Today's BP: 118/82 mmHg    Patient's hyperlipidemia is controlled with diet and medications. Patient denies myalgias or other medication SE's. Last lipids were at goal -  Chol198; HDL 97; LDL 64; Triglycerides 184 on 08/23/2014.   Patient has PreDiabetes  predating since Oct 2013 with A1c 5.7%  and patient denies reactive hypoglycemic symptoms, visual blurring, diabetic polys, or paresthesias. Last A1c was  5.7% on 08/23/2014.      Finally, patient has history of Vitamin D Deficiency of 28 in 2008 and last Vitamin D was 54 on 08/23/2014.      Medication Sig  . aspirin 81 MG tablet Take 81 mg by mouth daily.  [name] BENICAR 40 MG tablet TAKE 1 TABLET DAILY FOR    BLOOD PRESSURE  . Calcium Carbonate-Vit D-Min (CALCIUM 1200 PO) Take 2,400 mg by mouth daily.  . cholecalciferol (VITAMIN D) 1000 UNITS tablet Take 3,000 Units by mouth daily.   . Coenzyme Q10 (CO Q 10) 100 MG CAPS Take 200 mg by mouth daily. Quol Ultra CoQ10 172m  . cyclobenzaprine (FLEXERIL) 10 MG tablet Take 1/2 to 1 tablet 3 x day if needed for muscle spasm  . DULoxetine (CYMBALTA) 60 MG capsule Take 1 capsule daily for mood and pain  . esomeprazole (NEXIUM) 40 MG capsule Take 1 capsule (40 mg total) by mouth daily.  . fexofenadine (ALLEGRA) 180 MG tablet Take  180 mg by mouth as needed for allergies.   .Marland Kitchenloperamide (IMODIUM) 2 MG capsule Take 2 mg by mouth as needed for diarrhea or loose stools.  . mesalamine (CANASA) 1000 MG suppository Place 1 suppository (1,000 mg total) rectally at bedtime. (Patient not taking: Reported on 08/24/2014)  . CITRUCEL 500 MG TABS Take 1,000 mg by mouth daily.   .  Mega red 3076mTake 300 mg by mouth daily.  . Coletta Memosood Complex C Take 625 mg by mouth daily.  . predniSONE (DELTASONE) 5 MG tablet Take 1 tablet tid or as directed.  . Red Yeast Rice Extract 600 MG TABS Take 1,200 mg by mouth 2 (two) times daily.  . Marland KitchenulfaSALAzine (AZULFIDINE) 500 MG tablet Take 1 tablet (500 mg total) by mouth 2 (two) times daily.  . verapamil (VERELAN PM) 240 MG 24 hr capsule Take 1 capsule (240 mg total) by mouth daily.   Allergies  Allergen Reactions  . Augmentin [Amoxicillin-Pot Clavulanate] Diarrhea  . Lyrica [Pregabalin] Swelling    Swelling of feet.  . Meloxicam Other (See Comments)    Intestinal Bleed  . Other Other (See Comments)    Salt-sensitivity Grain-sensitivity Artificial sweetners-sensitivity Sugar-sensitivity  . Wheat Bran Other (See Comments)    Wheat-sensitivity  . Lactose Intolerance (Gi) Other (See Comments)    unknown  . Quinapril Other (See Comments)  unknown  . Vasotec [Enalaprilat] Cough   Past Medical History  Diagnosis Date  . History of fusion of cervical spine t  . Fusion of spine of lumbar region t  . Lactose intolerance   . Adult celiac disease   . Fibromyalgia   . Hypertension   . GERD (gastroesophageal reflux disease)   . Proctitis   . Ulcerative colitis   . Diverticulitis   . Factor V Leiden   . Concussion     fell jumping off of horse in past 6 months- 09/14/12  . Hyperlipidemia   . DJD (degenerative joint disease)   . DDD (degenerative disc disease)   . Spinal stenosis   . Scoliosis   . Vitamin D deficiency    Health Maintenance  Topic Date Due  . HIV Screening   01/19/1964  . TETANUS/TDAP  01/19/1968  . MAMMOGRAM  01/19/1999  . COLONOSCOPY  01/19/1999  . ZOSTAVAX  01/18/2009  . DEXA SCAN  01/18/2014  . PNA vac Low Risk Adult (1 of 2 - PCV13) 01/18/2014  . INFLUENZA VACCINE  02/25/2015   Immunization History  Administered Date(s) Administered  . Influenza-Unspecified 04/26/2014  . Pneumococcal Polysaccharide-23 09/26/2012   Past Surgical History  Procedure Laterality Date  . Tubal ligation  1983  . Foot surgery Right 1978    Reconstruction after faliure of emergency stitching  . Neck surgery  2002    anterior cervical fusion  . Lasik    . Eye surgery    . Anterior lateral lumbar fusion 4 levels N/A 09/22/2012    Procedure: ANTERIOR LATERAL LUMBAR FUSION 4 LEVELS;  Surgeon: Erline Levine, MD;  Location: [facility];  Service: Neurosurgery;  Laterality: N/A;  Thoracic Twelve Lumbar One,Lumbar One-TwoAnterolateral decompression/fusion/percutaneous percutaneous pedicle screws   . Chest tube insertion Left 09/22/2012    Procedure: CHEST TUBE INSERTION;  Surgeon: Erline Levine, MD;  Location: [place] [facility];  Service: Neurosurgery;  Laterality: Left;  Insertion per Dr. Cyndia Bent  . Back surgery  2006, 2008    . disectomy  . Cholecystectomy N/A 11/22/2013    Procedure: LAPAROSCOPIC CHOLECYSTECTOMY ;  Surgeon: Harl Bowie, MD;  Location: [facility];  Service: General;  Laterality: N/A;    Family History  Problem Relation Age of Onset  . Prostate cancer Father   . Cancer Father     skin cancer, prostate cancer with non-hodkins lymphoma  . Heart attack Brother 64    CABG  . Hypertension Brother   . Heart disease Brother   . Heart attack Brother 44  . Heart failure Mother   . Stroke Mother   . Hypertension Mother   . Heart attack Mother    History  Substance Use Topics  . Smoking status: Former Smoker -- 1.00 packs/day for 24 years    Types: Cigarettes    Quit date: 07/28/1990  . Smokeless tobacco: Not on file  . Alcohol Use: 4.2 oz/week     7 Glasses of wine per week    ROS Constitutional: Denies fever, chills, weight loss/gain, headaches, insomnia,  night sweats, and change in appetite. Does c/o fatigue. Eyes: Denies redness, blurred vision, diplopia, discharge, itchy, watery eyes.  ENT: Denies discharge, congestion, post nasal drip, epistaxis, sore throat, earache, hearing loss, dental pain, Tinnitus, Vertigo, Sinus pain, snoring.  Cardio: Denies chest pain, palpitations, irregular heartbeat, syncope, dyspnea, diaphoresis, orthopnea, PND, claudication, edema Respiratory: denies cough, dyspnea, DOE, pleurisy, hoarseness, laryngitis, wheezing.  Gastrointestinal: Denies dysphagia, heartburn, reflux, water brash, pain, cramps,  nausea, vomiting, bloating, diarrhea, constipation, hematemesis, melena, hematochezia, jaundice, hemorrhoids Genitourinary: Denies dysuria, frequency, urgency, nocturia, hesitancy, discharge, hematuria, flank pain Breast: Breast lumps, nipple discharge, bleeding.  Musculoskeletal: Denies arthralgia, myalgia, stiffness, Jt. Swelling, pain, limp, and strain/sprain. Denies falls. Skin: Denies puritis, rash, hives, warts, acne, eczema, changing in skin lesion Neuro: No weakness, tremor, incoordination, spasms, paresthesia, pain Psychiatric: Denies confusion, memory loss, sensory loss. Denies Depression. Endocrine: Denies change in weight, skin, hair change, nocturia, and paresthesia, diabetic polys, visual blurring, hyper / hypo glycemic episodes.  Heme/Lymph: No excessive bleeding, bruising, enlarged lymph nodes.  Physical Exam  BP 118/82   Pulse 84  Temp 97.8 F   Resp 16  Ht 5' 3.5"   Wt 146 lb     BMI 25.45  General Appearance: Well nourished and in no apparent distress. Eyes: PERRLA, EOMs, conjunctiva no swelling or erythema, normal fundi and vessels. Sinuses: No frontal/maxillary tenderness ENT/Mouth: EACs patent / TMs  nl. Nares clear without erythema, swelling, mucoid exudates. Oral hygiene is  good. No erythema, swelling, or exudate. Tongue normal, non-obstructing. Tonsils not swollen or erythematous. Hearing normal.  Neck: Supple, thyroid normal. No bruits, nodes or JVD. Respiratory: Respiratory effort normal.  BS equal and clear bilateral without rales, rhonci, wheezing or stridor. Cardio: Heart sounds are normal with regular rate and rhythm and no murmurs, rubs or gallops. Peripheral pulses are normal and equal bilaterally without edema. No aortic or femoral bruits. Chest: symmetric with normal excursions and percussion. Breasts: Symmetric, without lumps, nipple discharge, retractions, or fibrocystic changes.  Abdomen: Flat, soft, with bowl sounds. Nontender, no guarding, rebound, hernias, masses, or organomegaly.  Lymphatics: Non tender without lymphadenopathy.  Genitourinary:  Musculoskeletal: Full ROM all peripheral extremities, joint stability, 5/5 strength, and normal gait. Skin: Warm and dry without rashes, lesions, cyanosis, clubbing or  ecchymosis.  Neuro: Cranial nerves intact, reflexes equal bilaterally. Normal muscle tone, no cerebellar symptoms. Sensation intact.  Pysch: Awake and oriented X 3, normal affect, Insight and Judgment appropriate.   Assessment and Plan  1. Essential hypertension  - Microalbumin / creatinine urine ratio - EKG 12-Lead - [place], RETROPERITNL ABD,  LTD - TSH  2. Hyperlipidemia  - Lipid panel  3. Prediabetes  - Hemoglobin A1c - Insulin, random  4. Vitamin D deficiency  - Vit D  25 hydroxy   5. Ulcerative colitis,  Hx/o   6. Gastroesophageal reflux disease   7. CKD2 (GFR 77 ml/min)    8. Factor 5 Leiden mutation, heterozygous   9. Screening for rectal cancer  - POC Hemoccult Bld/Stl   10. Depression screen   11. At low risk for fall   12. Anemia, iron deficiency  - Iron and TIBC  13. B12 deficiency  - Vitamin B12  14. Medication management  - Urine Microscopic - CBC with Differential/Platelet - BASIC  METABOLIC PANEL WITH GFR - Hepatic function panel - Magnesium   Continue prudent diet as discussed, weight control, BP monitoring, regular exercise, and medications. Discussed med's effects and SE's. Screening labs and tests as requested with regular follow-up as recommended.  Over 40 minutes of exam, counseling, chart review was performed.

## 2015-01-04 NOTE — Patient Instructions (Signed)
Recommend Adult Low dose Aspirin or baby Aspirin 81 mg daily   To reduce risk of Colon Cancer 20 %,   Skin Cancer 26 % ,   Melanoma 46%   and   Pancreatic cancer 60%  ++++++++++++++++++  Vitamin D goal is between 70-100.   Please make sure that you are taking your Vitamin D as directed.   It is very important as a natural anti-inflammatory   helping hair, skin, and nails, as well as reducing stroke and heart attack risk.   It helps your bones and helps with mood.  It also decreases numerous cancer risks so please take it as directed.   Low Vit D is associated with a 200-300% higher risk for CANCER   and 200-300% higher risk for HEART   ATTACK  &  STROKE.    ......................................  It is also associated with higher death rate at younger ages,   autoimmune diseases like Rheumatoid arthritis, Lupus, Multiple Sclerosis.     Also many other serious conditions, like depression, Alzheimer's  Dementia, infertility, muscle aches, fatigue, fibromyalgia - just to name a few.  +++++++++++++++++++    Recommend the book "The END of DIETING" by Dr Joel Fuhrman   & the book "The END of DIABETES " by Dr Joel Fuhrman  At [employer] - get book & Audio CD's     Being diabetic has a  300% increased risk for heart attack, stroke, cancer, and alzheimer- type vascular dementia. It is very important that you work harder with diet by avoiding all foods that are white. Avoid white rice (brown & wild rice is OK), white potatoes (sweetpotatoes in moderation is OK), White bread or wheat bread or anything made out of white flour like bagels, donuts, rolls, buns, biscuits, cakes, pastries, cookies, pizza crust, and pasta (made from white flour & egg whites) - vegetarian pasta or spinach or wheat pasta is OK. Multigrain breads like Arnold's or [employer], or multigrain sandwich thins or flatbreads.  Diet, exercise and weight loss can reverse and cure diabetes in the early  stages.  Diet, exercise and weight loss is very important in the control and prevention of complications of diabetes which affects every system in your body, ie. Brain - dementia/stroke, eyes - glaucoma/blindness, heart - heart attack/heart failure, kidneys - dialysis, stomach - gastric paralysis, intestines - malabsorption, nerves - severe painful neuritis, circulation - gangrene & loss of a leg(s), and finally cancer and Alzheimers.    I recommend avoid fried & greasy foods,  sweets/candy, white rice (brown or wild rice or Quinoa is OK), white potatoes (sweet potatoes are OK) - anything made from white flour - bagels, doughnuts, rolls, buns, biscuits,white and wheat breads, pizza crust and traditional pasta made of white flour & egg white(vegetarian pasta or spinach or wheat pasta is OK).  Multi-grain bread is OK - like multi-grain flat bread or sandwich thins. Avoid alcohol in excess. Exercise is also important.    Eat all the vegetables you want - avoid meat, especially red meat and dairy - especially cheese.  Cheese is the most concentrated form of trans-fats which is the worst thing to clog up our arteries. Veggie cheese is OK which can be found in the fresh produce section at [employer] or [employer] or [employer]  ++++++++++++++++++++++++++  Preventive Care for Adults  A healthy lifestyle and preventive care can promote health and wellness. Preventive health guidelines for women include the following key practices.  A routine yearly physical is   a good way to check with your health care provider about your health and preventive screening. It is a chance to share any concerns and updates on your health and to receive a thorough exam.  Visit your dentist for a routine exam and preventive care every 6 months. Brush your teeth twice a day and floss once a day. Good oral hygiene prevents tooth decay and gum disease.  The frequency of eye exams is based on your age, health, family medical  history, use of contact lenses, and other factors. Follow your health care provider's recommendations for frequency of eye exams.  Eat a healthy diet. Foods like vegetables, fruits, whole grains, low-fat dairy products, and lean protein foods contain the nutrients you need without too many calories. Decrease your intake of foods high in solid fats, added sugars, and salt. Eat the right amount of calories for you.Get information about a proper diet from your health care provider, if necessary.  Regular physical exercise is one of the most important things you can do for your health. Most adults should get at least 150 minutes of moderate-intensity exercise (any activity that increases your heart rate and causes you to sweat) each week. In addition, most adults need muscle-strengthening exercises on 2 or more days a week.  Maintain a healthy weight. The body mass index (BMI) is a screening tool to identify possible weight problems. It provides an estimate of body fat based on height and weight. Your health care provider can find your BMI and can help you achieve or maintain a healthy weight.For adults 20 years and older:  A BMI below 18.5 is considered underweight.  A BMI of 18.5 to 24.9 is normal.  A BMI of 25 to 29.9 is considered overweight.  A BMI of 30 and above is considered obese.  Maintain normal blood lipids and cholesterol levels by exercising and minimizing your intake of saturated fat. Eat a balanced diet with plenty of fruit and vegetables. If your lipid or cholesterol levels are high, you are over 50, or you are at high risk for heart disease, you may need your cholesterol levels checked more frequently.Ongoing high lipid and cholesterol levels should be treated with medicines if diet and exercise are not working.  If you smoke, find out from your health care provider how to quit. If you do not use tobacco, do not start.  Lung cancer screening is recommended for adults aged 55-80  years who are at high risk for developing lung cancer because of a history of smoking. A yearly low-dose CT scan of the lungs is recommended for people who have at least a 30-pack-year history of smoking and are a current smoker or have quit within the past 15 years. A pack year of smoking is smoking an average of 1 pack of cigarettes a day for 1 year (for example: 1 pack a day for 30 years or 2 packs a day for 15 years). Yearly screening should continue until the smoker has stopped smoking for at least 15 years. Yearly screening should be stopped for people who develop a health problem that would prevent them from having lung cancer treatment.  Avoid use of street drugs. Do not share needles with anyone. Ask for help if you need support or instructions about stopping the use of drugs.  High blood pressure causes heart disease and increases the risk of stroke.  Ongoing high blood pressure should be treated with medicines if weight loss and exercise do not work.  If   you are 55-79 years old, ask your health care provider if you should take aspirin to prevent strokes.  Diabetes screening involves taking a blood sample to check your fasting blood sugar level. This should be done once every 3 years, after age 45, if you are within normal weight and without risk factors for diabetes. Testing should be considered at a younger age or be carried out more frequently if you are overweight and have at least 1 risk factor for diabetes.  Breast cancer screening is essential preventive care for women. You should practice "breast self-awareness." This means understanding the normal appearance and feel of your breasts and may include breast self-examination. Any changes detected, no matter how small, should be reported to a health care provider. Women in their 20s and 30s should have a clinical breast exam (CBE) by a health care provider as part of a regular health exam every 1 to 3 years. After age 40, women should have a  CBE every year. Starting at age 40, women should consider having a mammogram (breast X-ray test) every year. Women who have a family history of breast cancer should talk to their health care provider about genetic screening. Women at a high risk of breast cancer should talk to their health care providers about having an MRI and a mammogram every year.  Breast cancer gene (BRCA)-related cancer risk assessment is recommended for women who have family members with BRCA-related cancers. BRCA-related cancers include breast, ovarian, tubal, and peritoneal cancers. Having family members with these cancers may be associated with an increased risk for harmful changes (mutations) in the breast cancer genes BRCA1 and BRCA2. Results of the assessment will determine the need for genetic counseling and BRCA1 and BRCA2 testing.  Routine pelvic exams to screen for cancer are no longer recommended for nonpregnant women who are considered low risk for cancer of the pelvic organs (ovaries, uterus, and vagina) and who do not have symptoms. Ask your health care provider if a screening pelvic exam is right for you.  If you have had past treatment for cervical cancer or a condition that could lead to cancer, you need Pap tests and screening for cancer for at least 20 years after your treatment. If Pap tests have been discontinued, your risk factors (such as having a new sexual partner) need to be reassessed to determine if screening should be resumed. Some women have medical problems that increase the chance of getting cervical cancer. In these cases, your health care provider may recommend more frequent screening and Pap tests.    Colorectal cancer can be detected and often prevented. Most routine colorectal cancer screening begins at the age of 50 years and continues through age 75 years. However, your health care provider may recommend screening at an earlier age if you have risk factors for colon cancer. On a yearly basis,  your health care provider may provide home test kits to check for hidden blood in the stool. Use of a small camera at the end of a tube, to directly examine the colon (sigmoidoscopy or colonoscopy), can detect the earliest forms of colorectal cancer. Talk to your health care provider about this at age 50, when routine screening begins. Direct exam of the colon should be repeated every 5-10 years through age 75 years, unless early forms of pre-cancerous polyps or small growths are found.  Osteoporosis is a disease in which the bones lose minerals and strength with aging. This can result in serious bone fractures or breaks. The   risk of osteoporosis can be identified using a bone density scan. Women ages 65 years and over and women at risk for fractures or osteoporosis should discuss screening with their health care providers. Ask your health care provider whether you should take a calcium supplement or vitamin D to reduce the rate of osteoporosis.  Menopause can be associated with physical symptoms and risks. Hormone replacement therapy is available to decrease symptoms and risks. You should talk to your health care provider about whether hormone replacement therapy is right for you.  Use sunscreen. Apply sunscreen liberally and repeatedly throughout the day. You should seek shade when your shadow is shorter than you. Protect yourself by wearing long sleeves, pants, a wide-brimmed hat, and sunglasses year round, whenever you are outdoors.  Once a month, do a whole body skin exam, using a mirror to look at the skin on your back. Tell your health care provider of new moles, moles that have irregular borders, moles that are larger than a pencil eraser, or moles that have changed in shape or color.  Stay current with required vaccines (immunizations).  Influenza vaccine. All adults should be immunized every year.  Tetanus, diphtheria, and acellular pertussis (Td, Tdap) vaccine. Pregnant women should receive  1 dose of Tdap vaccine during each pregnancy. The dose should be obtained regardless of the length of time since the last dose. Immunization is preferred during the 27th-36th week of gestation. An adult who has not previously received Tdap or who does not know her vaccine status should receive 1 dose of Tdap. This initial dose should be followed by tetanus and diphtheria toxoids (Td) booster doses every 10 years. Adults with an unknown or incomplete history of completing a 3-dose immunization series with Td-containing vaccines should begin or complete a primary immunization series including a Tdap dose. Adults should receive a Td booster every 10 years.    Zoster vaccine. One dose is recommended for adults aged 60 years or older unless certain conditions are present.    Pneumococcal 13-valent conjugate (PCV13) vaccine. When indicated, a person who is uncertain of her immunization history and has no record of immunization should receive the PCV13 vaccine. An adult aged 19 years or older who has certain medical conditions and has not been previously immunized should receive 1 dose of PCV13 vaccine. This PCV13 should be followed with a dose of pneumococcal polysaccharide (PPSV23) vaccine. The PPSV23 vaccine dose should be obtained at least 8 weeks after the dose of PCV13 vaccine. An adult aged 19 years or older who has certain medical conditions and previously received 1 or more doses of PPSV23 vaccine should receive 1 dose of PCV13. The PCV13 vaccine dose should be obtained 1 or more years after the last PPSV23 vaccine dose.    Pneumococcal polysaccharide (PPSV23) vaccine. When PCV13 is also indicated, PCV13 should be obtained first. All adults aged 65 years and older should be immunized. An adult younger than age 65 years who has certain medical conditions should be immunized. Any person who resides in a nursing home or long-term care facility should be immunized. An adult smoker should be immunized.  People with an immunocompromised condition and certain other conditions should receive both PCV13 and PPSV23 vaccines. People with human immunodeficiency virus (HIV) infection should be immunized as soon as possible after diagnosis. Immunization during chemotherapy or radiation therapy should be avoided. Routine use of PPSV23 vaccine is not recommended for American Indians, [place] Natives, or people younger than 65 years unless there are medical   conditions that require PPSV23 vaccine. When indicated, people who have unknown immunization and have no record of immunization should receive PPSV23 vaccine. One-time revaccination 5 years after the first dose of PPSV23 is recommended for people aged 19-64 years who have chronic kidney failure, nephrotic syndrome, asplenia, or immunocompromised conditions. People who received 1-2 doses of PPSV23 before age 9 years should receive another dose of PPSV23 vaccine at age 62 years or later if at least 5 years have passed since the previous dose. Doses of PPSV23 are not needed for people immunized with PPSV23 at or after age 53 years.   Preventive Services / Frequency  Ages 32 years and over  Blood pressure check.  Lipid and cholesterol check.  Lung cancer screening. / Every year if you are aged 71-80 years and have a 30-pack-year history of smoking and currently smoke or have quit within the past 15 years. Yearly screening is stopped once you have quit smoking for at least 15 years or develop a health problem that would prevent you from having lung cancer treatment.  Clinical breast exam.** / Every year after age 65 years.  BRCA-related cancer risk assessment.** / For women who have family members with a BRCA-related cancer (breast, ovarian, tubal, or peritoneal cancers).  Mammogram.** / Every year beginning at age 46 years and continuing for as long as you are in good health. Consult with your health care provider.  Pap test.** / Every 3 years starting at  age 71 years through age 47 or 50 years with 3 consecutive normal Pap tests. Testing can be stopped between 65 and 70 years with 3 consecutive normal Pap tests and no abnormal Pap or HPV tests in the past 10 years.  Fecal occult blood test (FOBT) of stool. / Every year beginning at age 44 years and continuing until age 73 years. You may not need to do this test if you get a colonoscopy every 10 years.  Flexible sigmoidoscopy or colonoscopy.** / Every 5 years for a flexible sigmoidoscopy or every 10 years for a colonoscopy beginning at age 37 years and continuing until age 2 years.  Hepatitis C blood test.** / For all people born from [birth year] through 1967 and any individual with known risks for hepatitis C.  Osteoporosis screening.** / A one-time screening for women ages 9 years and over and women at risk for fractures or osteoporosis.  Skin self-exam. / Monthly.  Influenza vaccine. / Every year.  Tetanus, diphtheria, and acellular pertussis (Tdap/Td) vaccine.** / 1 dose of Td every 10 years.  Zoster vaccine.** / 1 dose for adults aged 31 years or older.  Pneumococcal 13-valent conjugate (PCV13) vaccine.** / Consult your health care provider.  Pneumococcal polysaccharide (PPSV23) vaccine.** / 1 dose for all adults aged 32 years and older. Screening for abdominal aortic aneurysm (AAA)  by ultrasound is recommended for people who have history of high blood pressure or who are current or former smokers.

## 2015-01-05 LAB — CBC WITH DIFFERENTIAL/PLATELET
Basophils Absolute: 0 10*3/uL (ref 0.0–0.1)
Basophils Relative: 0 % (ref 0–1)
Eosinophils Absolute: 0.2 10*3/uL (ref 0.0–0.7)
Eosinophils Relative: 1 % (ref 0–5)
HCT: 39 % (ref 36.0–46.0)
HEMOGLOBIN: 13 g/dL (ref 12.0–15.0)
LYMPHS ABS: 4.4 10*3/uL — AB (ref 0.7–4.0)
Lymphocytes Relative: 27 % (ref 12–46)
MCH: 30.4 pg (ref 26.0–34.0)
MCHC: 33.3 g/dL (ref 30.0–36.0)
MCV: 91.1 fL (ref 78.0–100.0)
MONO ABS: 2.1 10*3/uL — AB (ref 0.1–1.0)
MPV: 8.7 fL (ref 8.6–12.4)
Monocytes Relative: 13 % — ABNORMAL HIGH (ref 3–12)
Neutro Abs: 9.6 10*3/uL — ABNORMAL HIGH (ref 1.7–7.7)
Neutrophils Relative %: 59 % (ref 43–77)
Platelets: 255 10*3/uL (ref 150–400)
RBC: 4.28 MIL/uL (ref 3.87–5.11)
RDW: 14.4 % (ref 11.5–15.5)
WBC: 16.3 10*3/uL — ABNORMAL HIGH (ref 4.0–10.5)

## 2015-01-05 LAB — MAGNESIUM: MAGNESIUM: 1.8 mg/dL (ref 1.5–2.5)

## 2015-01-05 LAB — LIPID PANEL
Cholesterol: 229 mg/dL — ABNORMAL HIGH (ref 0–200)
HDL: 133 mg/dL (ref >=46)
LDL Cholesterol: 76 mg/dL (ref 0–99)
Total CHOL/HDL Ratio: 1.7 Ratio
Triglycerides: 102 mg/dL (ref <150)
VLDL: 20 mg/dL (ref 0–40)

## 2015-01-05 LAB — HEPATIC FUNCTION PANEL
ALT: 15 U/L (ref 0–35)
AST: 17 U/L (ref 0–37)
Albumin: 4.5 g/dL (ref 3.5–5.2)
Alkaline Phosphatase: 67 U/L (ref 39–117)
BILIRUBIN DIRECT: 0.2 mg/dL (ref 0.0–0.3)
BILIRUBIN INDIRECT: 0.7 mg/dL (ref 0.2–1.2)
Total Bilirubin: 0.9 mg/dL (ref 0.2–1.2)
Total Protein: 6.6 g/dL (ref 6.0–8.3)

## 2015-01-05 LAB — IRON AND TIBC
%SAT: 49 % (ref 20–55)
Iron: 168 ug/dL — ABNORMAL HIGH (ref 42–145)
TIBC: 346 ug/dL (ref 250–470)
UIBC: 178 ug/dL (ref 125–400)

## 2015-01-05 LAB — MICROALBUMIN / CREATININE URINE RATIO
Creatinine, Urine: 93 mg/dL
MICROALB UR: 0.6 mg/dL (ref <2.0)
MICROALB/CREAT RATIO: 6.5 mg/g (ref 0.0–30.0)

## 2015-01-05 LAB — BASIC METABOLIC PANEL WITH GFR
BUN: 19 mg/dL (ref 6–23)
CO2: 25 mEq/L (ref 19–32)
Calcium: 9.8 mg/dL (ref 8.4–10.5)
Chloride: 97 mEq/L (ref 96–112)
Creat: 0.79 mg/dL (ref 0.50–1.10)
GFR, Est Non African American: 79 mL/min
Glucose, Bld: 79 mg/dL (ref 70–99)
POTASSIUM: 3.5 meq/L (ref 3.5–5.3)
SODIUM: 134 meq/L — AB (ref 135–145)

## 2015-01-05 LAB — URINALYSIS, MICROSCOPIC ONLY
Bacteria, UA: NONE SEEN
Casts: NONE SEEN
Crystals: NONE SEEN
Squamous Epithelial / HPF: NONE SEEN

## 2015-01-05 LAB — VITAMIN D 25 HYDROXY (VIT D DEFICIENCY, FRACTURES): VIT D 25 HYDROXY: 54 ng/mL (ref 30–100)

## 2015-01-05 LAB — HEMOGLOBIN A1C
HEMOGLOBIN A1C: 5.9 % — AB (ref <5.7)
MEAN PLASMA GLUCOSE: 123 mg/dL — AB (ref <117)

## 2015-01-05 LAB — INSULIN, RANDOM: Insulin: 4.6 u[IU]/mL (ref 2.0–19.6)

## 2015-01-07 ENCOUNTER — Other Ambulatory Visit: Payer: Self-pay | Admitting: *Deleted

## 2015-01-07 MED ORDER — DULOXETINE HCL 60 MG PO CPEP: ORAL_CAPSULE | ORAL | Status: DC

## 2015-01-22 ENCOUNTER — Encounter: Payer: Self-pay | Admitting: Physician Assistant

## 2015-01-29 ENCOUNTER — Other Ambulatory Visit: Payer: Self-pay

## 2015-01-29 DIAGNOSIS — K2100 Gastro-esophageal reflux disease with esophagitis, without bleeding: Secondary | ICD-10-CM

## 2015-01-29 MED ORDER — ESOMEPRAZOLE MAGNESIUM 40 MG PO CPDR: 40.0000 mg | DELAYED_RELEASE_CAPSULE | Freq: Every day | ORAL | Status: DC

## 2015-02-13 ENCOUNTER — Encounter: Payer: Self-pay | Admitting: Physician Assistant

## 2015-02-27 ENCOUNTER — Other Ambulatory Visit: Payer: Self-pay | Admitting: *Deleted

## 2015-02-27 MED ORDER — OLMESARTAN MEDOXOMIL 40 MG PO TABS: ORAL_TABLET | ORAL | Status: DC

## 2015-04-18 ENCOUNTER — Encounter: Payer: Self-pay | Admitting: Internal Medicine

## 2015-04-18 ENCOUNTER — Ambulatory Visit (INDEPENDENT_AMBULATORY_CARE_PROVIDER_SITE_OTHER): Payer: BLUE CROSS/BLUE SHIELD | Admitting: Internal Medicine

## 2015-04-18 VITALS — BP 116/70 | HR 80 | Temp 98.2°F | Resp 16 | Ht 63.5 in | Wt 148.0 lb

## 2015-04-18 DIAGNOSIS — I10 Essential (primary) hypertension: Secondary | ICD-10-CM | POA: Diagnosis not present

## 2015-04-18 DIAGNOSIS — N182 Chronic kidney disease, stage 2 (mild): Secondary | ICD-10-CM | POA: Diagnosis not present

## 2015-04-18 DIAGNOSIS — Z79899 Other long term (current) drug therapy: Secondary | ICD-10-CM | POA: Diagnosis not present

## 2015-04-18 DIAGNOSIS — E782 Mixed hyperlipidemia: Secondary | ICD-10-CM

## 2015-04-18 DIAGNOSIS — E559 Vitamin D deficiency, unspecified: Secondary | ICD-10-CM | POA: Diagnosis not present

## 2015-04-18 DIAGNOSIS — R7309 Other abnormal glucose: Secondary | ICD-10-CM | POA: Diagnosis not present

## 2015-04-18 DIAGNOSIS — R7303 Prediabetes: Secondary | ICD-10-CM

## 2015-04-18 LAB — HEPATIC FUNCTION PANEL
ALK PHOS: 53 U/L (ref 33–130)
ALT: 16 U/L (ref 6–29)
AST: 21 U/L (ref 10–35)
Albumin: 4.5 g/dL (ref 3.6–5.1)
BILIRUBIN DIRECT: 0.2 mg/dL (ref <=0.2)
BILIRUBIN TOTAL: 0.7 mg/dL (ref 0.2–1.2)
Indirect Bilirubin: 0.5 mg/dL (ref 0.2–1.2)
Total Protein: 6.6 g/dL (ref 6.1–8.1)

## 2015-04-18 LAB — BASIC METABOLIC PANEL WITH GFR
BUN: 24 mg/dL (ref 7–25)
CHLORIDE: 98 mmol/L (ref 98–110)
CO2: 26 mmol/L (ref 20–31)
Calcium: 9.5 mg/dL (ref 8.6–10.4)
Creat: 0.92 mg/dL (ref 0.50–0.99)
GFR, Est African American: 75 mL/min (ref >=60)
GFR, Est Non African American: 65 mL/min (ref >=60)
Glucose, Bld: 96 mg/dL (ref 65–99)
Potassium: 3.9 mmol/L (ref 3.5–5.3)
SODIUM: 138 mmol/L (ref 135–146)

## 2015-04-18 LAB — CBC WITH DIFFERENTIAL/PLATELET
BASOS PCT: 0 % (ref 0–1)
Basophils Absolute: 0 10*3/uL (ref 0.0–0.1)
Eosinophils Absolute: 0 10*3/uL (ref 0.0–0.7)
Eosinophils Relative: 0 % (ref 0–5)
HCT: 36.8 % (ref 36.0–46.0)
Hemoglobin: 12.4 g/dL (ref 12.0–15.0)
LYMPHS ABS: 1.1 10*3/uL (ref 0.7–4.0)
Lymphocytes Relative: 8 % — ABNORMAL LOW (ref 12–46)
MCH: 30.8 pg (ref 26.0–34.0)
MCHC: 33.7 g/dL (ref 30.0–36.0)
MCV: 91.3 fL (ref 78.0–100.0)
MONOS PCT: 4 % (ref 3–12)
MPV: 8.7 fL (ref 8.6–12.4)
Monocytes Absolute: 0.6 10*3/uL (ref 0.1–1.0)
NEUTROS ABS: 12.5 10*3/uL — AB (ref 1.7–7.7)
NEUTROS PCT: 88 % — AB (ref 43–77)
PLATELETS: 271 10*3/uL (ref 150–400)
RBC: 4.03 MIL/uL (ref 3.87–5.11)
RDW: 13.6 % (ref 11.5–15.5)
WBC: 14.2 10*3/uL — ABNORMAL HIGH (ref 4.0–10.5)

## 2015-04-18 LAB — LIPID PANEL
CHOL/HDL RATIO: 2 ratio (ref <=5.0)
Cholesterol: 212 mg/dL — ABNORMAL HIGH (ref 125–200)
HDL: 104 mg/dL (ref >=46)
LDL CALC: 86 mg/dL (ref <130)
Triglycerides: 112 mg/dL (ref <150)
VLDL: 22 mg/dL (ref <30)

## 2015-04-18 LAB — MAGNESIUM: Magnesium: 1.7 mg/dL (ref 1.5–2.5)

## 2015-04-18 NOTE — Progress Notes (Signed)
Patient ID: Carmen Reynolds, female   DOB: 20-Sep-1950, 64 y.o.   MRN: [id number]  Assessment and Plan:  Hypertension:  -Continue medication,  -monitor blood pressure at home.  -Continue DASH diet.   -Reminder to go to the ER if any CP, SOB, nausea, dizziness, severe HA, changes vision/speech, left arm numbness and tingling, and jaw pain.  Cholesterol: -Continue diet and exercise.  -Check cholesterol.   Pre-diabetes: -Continue diet and exercise.  -Check A1C  Vitamin D Def: -check level -continue medications.   Continue diet and meds as discussed. Further disposition pending results of labs.  HPI 64 y.o. female  presents for 3 month follow up with hypertension, hyperlipidemia, prediabetes and vitamin D.   Her blood pressure has been controlled at home, today their BP is BP: 116/70 mmHg.   She does workout. She denies chest pain, shortness of breath, dizziness.   She is on cholesterol medication and denies myalgias. Her cholesterol is at goal. The cholesterol last visit was:   Lab Results  Component Value Date   CHOL 229* 01/04/2015   HDL 133 01/04/2015   LDLCALC 76 01/04/2015   TRIG 102 01/04/2015   CHOLHDL 1.7 01/04/2015     She has been working on diet and exercise for prediabetes, and denies foot ulcerations, hyperglycemia, hypoglycemia , increased appetite, nausea, paresthesia of the feet, polydipsia, polyuria, visual disturbances, vomiting and weight loss. Last A1C in the office was:  Lab Results  Component Value Date   HGBA1C 5.9* 01/04/2015    Patient is on Vitamin D supplement.  Lab Results  Component Value Date   VD25OH 54 01/04/2015      Current Medications:  Current Outpatient Prescriptions on File Prior to Visit  Medication Sig Dispense Refill  . aspirin 81 MG tablet Take 81 mg by mouth daily.    . Calcium Carbonate-Vit D-Min (CALCIUM 1200 PO) Take 2,400 mg by mouth daily.    . cholecalciferol (VITAMIN D) 1000 UNITS tablet Take 3,000 Units by mouth daily.      . Coenzyme Q10 (CO Q 10) 100 MG CAPS Take 200 mg by mouth daily. Quol Ultra CoQ10 163m    . cyclobenzaprine (FLEXERIL) 10 MG tablet Take 1/2 to 1 tablet  3 x day if needed for muscle spasm 90 tablet 99  . DULoxetine (CYMBALTA) 60 MG capsule Take 1 capsule daily for mood and pain 90 capsule 4  . esomeprazole (NEXIUM) 40 MG capsule Take 1 capsule (40 mg total) by mouth daily. 90 capsule PRN  . fexofenadine (ALLEGRA) 180 MG tablet Take 180 mg by mouth as needed for allergies.     .Marland Kitchenloperamide (IMODIUM) 2 MG capsule Take 2 mg by mouth as needed for diarrhea or loose stools.    . mesalamine (CANASA) 1000 MG suppository Place 1 suppository (1,000 mg total) rectally at bedtime. 90 suppository 3  . Methylcellulose, Laxative, (CITRUCEL) 500 MG TABS Take 1,000 mg by mouth daily.     .Marland Kitchenolmesartan (BENICAR) 40 MG tablet TAKE 1 TABLET DAILY FOR    BLOOD PRESSURE 90 tablet 3  . OVER THE COUNTER MEDICATION Take 300 mg by mouth daily. Mega red 3040m   . OVER THE COUNTER MEDICATION Take 625 mg by mouth daily. Mega Food Complex C    . predniSONE (DELTASONE) 5 MG tablet Take 1 tablet tid or as directed. 270 tablet 3  . Red Yeast Rice Extract 600 MG TABS Take 1,200 mg by mouth 2 (two) times daily.    .[name]  verapamil (VERELAN PM) 240 MG 24 hr capsule Take 1 capsule (240 mg total) by mouth daily. 90 capsule PRN   No current facility-administered medications on file prior to visit.    Medical History:  Past Medical History  Diagnosis Date  . History of fusion of cervical spine t  . Fusion of spine of lumbar region t  . Lactose intolerance   . Adult celiac disease   . Fibromyalgia   . Hypertension   . GERD (gastroesophageal reflux disease)   . Proctitis   . Ulcerative colitis   . Diverticulitis   . Factor V Leiden   . Concussion     fell jumping off of horse in past 6 months- 09/14/12  . Hyperlipidemia   . DJD (degenerative joint disease)   . DDD (degenerative disc disease)   . Spinal stenosis   .  Scoliosis   . Vitamin D deficiency     Allergies:  Allergies  Allergen Reactions  . Augmentin [Amoxicillin-Pot Clavulanate] Diarrhea  . Lyrica [Pregabalin] Swelling    Swelling of feet.  . Meloxicam Other (See Comments)    Intestinal Bleed  . Other Other (See Comments)    Salt-sensitivity Grain-sensitivity Artificial sweetners-sensitivity Sugar-sensitivity  . Wheat Bran Other (See Comments)    Wheat-sensitivity  . Lactose Intolerance (Gi) Other (See Comments)    unknown  . Quinapril Other (See Comments)    unknown  . Vasotec [Enalaprilat] Cough     Review of Systems:  Review of Systems  Constitutional: Negative for fever, chills and malaise/fatigue.  HENT: Negative for congestion, ear pain and sore throat.   Respiratory: Negative for cough, shortness of breath and wheezing.   Cardiovascular: Negative for chest pain, palpitations and leg swelling.  Gastrointestinal: Negative for heartburn, nausea, vomiting, diarrhea, constipation, blood in stool and melena.  Genitourinary: Negative.   Skin: Negative.   Neurological: Negative for dizziness, sensory change, loss of consciousness and headaches.  Psychiatric/Behavioral: Negative for depression. The patient is not nervous/anxious and does not have insomnia.     Family history- Review and unchanged  Social history- Review and unchanged  Physical Exam: BP 116/70 mmHg  Pulse 80  Temp(Src) 98.2 F (36.8 C) (Temporal)  Resp 16  Ht 5' 3.5" (1.613 m)  Wt 148 lb (67.132 kg)  BMI 25.80 kg/m2 Wt Readings from Last 3 Encounters:  04/18/15 148 lb (67.132 kg)  01/04/15 146 lb (66.225 kg)  08/23/14 148 lb (67.132 kg)    General Appearance: Well nourished well developed, in no apparent distress. Eyes: PERRLA, EOMs, conjunctiva no swelling or erythema ENT/Mouth: Ear canals normal without obstruction, swelling, erythma, discharge.  TMs normal bilaterally.  Oropharynx moist, clear, without exudate, or postoropharyngeal  swelling. Neck: Supple, thyroid normal,no cervical adenopathy  Respiratory: Respiratory effort normal, Breath sounds clear A&P without rhonchi, wheeze, or rale.  No retractions, no accessory usage. Cardio: RRR with no MRGs. Brisk peripheral pulses without edema.  Abdomen: Soft, + BS,  Non tender, no guarding, rebound, hernias, masses. Musculoskeletal: Full ROM, 5/5 strength, Antalgic gait Skin: Warm, dry without rashes, lesions, ecchymosis.  Neuro: Awake and oriented X 3, Cranial nerves intact. Normal muscle tone, no cerebellar symptoms. Psych: Normal affect, Insight and Judgment appropriate.    Starlyn Skeans, PA-C 2:54 PM [facility]

## 2015-04-19 LAB — VITAMIN D 25 HYDROXY (VIT D DEFICIENCY, FRACTURES): Vit D, 25-Hydroxy: 70 ng/mL (ref 30–100)

## 2015-04-19 LAB — TSH: TSH: 1.573 u[IU]/mL (ref 0.350–4.500)

## 2015-04-19 LAB — HEMOGLOBIN A1C
Hgb A1c MFr Bld: 5.9 % — ABNORMAL HIGH (ref <5.7)
MEAN PLASMA GLUCOSE: 123 mg/dL — AB (ref <117)

## 2015-04-19 LAB — INSULIN, RANDOM: Insulin: 4.4 u[IU]/mL (ref 2.0–19.6)

## 2015-05-06 ENCOUNTER — Other Ambulatory Visit: Payer: Self-pay | Admitting: Internal Medicine

## 2015-05-06 ENCOUNTER — Encounter: Payer: Self-pay | Admitting: Internal Medicine

## 2015-05-06 ENCOUNTER — Other Ambulatory Visit: Payer: Self-pay

## 2015-05-06 DIAGNOSIS — K5100 Ulcerative (chronic) pancolitis without complications: Secondary | ICD-10-CM

## 2015-05-06 MED ORDER — VERAPAMIL HCL ER 240 MG PO CP24: 240.0000 mg | ORAL_CAPSULE | Freq: Every day | ORAL | Status: DC

## 2015-05-06 MED ORDER — PREDNISONE 5 MG PO TABS: ORAL_TABLET | ORAL | Status: DC

## 2015-05-27 ENCOUNTER — Other Ambulatory Visit: Payer: Self-pay | Admitting: *Deleted

## 2015-05-27 DIAGNOSIS — Z1212 Encounter for screening for malignant neoplasm of rectum: Secondary | ICD-10-CM

## 2015-05-27 LAB — POC HEMOCCULT BLD/STL (HOME/3-CARD/SCREEN)
Card #2 Fecal Occult Blod, POC: NEGATIVE
Card #3 Fecal Occult Blood, POC: NEGATIVE
Fecal Occult Blood, POC: NEGATIVE

## 2015-07-16 ENCOUNTER — Ambulatory Visit (INDEPENDENT_AMBULATORY_CARE_PROVIDER_SITE_OTHER): Payer: BLUE CROSS/BLUE SHIELD | Admitting: Internal Medicine

## 2015-07-16 ENCOUNTER — Encounter: Payer: Self-pay | Admitting: Internal Medicine

## 2015-07-16 VITALS — BP 144/86 | HR 88 | Temp 97.6°F | Resp 18 | Ht 63.5 in | Wt 150.0 lb

## 2015-07-16 DIAGNOSIS — J069 Acute upper respiratory infection, unspecified: Secondary | ICD-10-CM

## 2015-07-16 MED ORDER — PROMETHAZINE-DM 6.25-15 MG/5ML PO SYRP: ORAL_SOLUTION | ORAL | Status: DC

## 2015-07-16 MED ORDER — PREDNISONE 20 MG PO TABS: ORAL_TABLET | ORAL | Status: DC

## 2015-07-16 MED ORDER — DOXYCYCLINE HYCLATE 100 MG PO CAPS: 100.0000 mg | ORAL_CAPSULE | Freq: Two times a day (BID) | ORAL | Status: DC

## 2015-07-16 MED ORDER — ALBUTEROL SULFATE HFA 108 (90 BASE) MCG/ACT IN AERS: 2.0000 | INHALATION_SPRAY | RESPIRATORY_TRACT | Status: DC | PRN

## 2015-07-16 MED ORDER — FLUTICASONE PROPIONATE 50 MCG/ACT NA SUSP: 2.0000 | Freq: Every day | NASAL | Status: DC

## 2015-07-16 NOTE — Progress Notes (Signed)
Patient ID: Carmen Reynolds, female   DOB: 02/12/1951, 64 y.o.   MRN: [id number]  HPI  Patient presents to the office for evaluation of cough.  It has been going on for 2 weeks.  Patient reports wet, yellow green sputum production with cough.  They also endorse change in voice, night sweats, postnasal drip and sinus pressure, ocasional sore throat, left ear popping.  .  They have tried zpak, mucinex dm, allegra, cough syrup.  They report that nothing has worked.  They admits to other sick contacts.  She reports that she had contact with a small child with similar symptoms andnow her husband has been having same symptoms.    Review of Systems  Constitutional: Negative for fever, chills and malaise/fatigue.  HENT: Positive for congestion, ear pain and sore throat.   Respiratory: Positive for cough and sputum production. Negative for shortness of breath and wheezing.   Cardiovascular: Negative for chest pain, palpitations and leg swelling.  Neurological: Negative for headaches.    PE:  Filed Vitals:   07/16/15 0910  BP: 144/86  Pulse: 88  Temp: 97.6 F (36.4 C)  Resp: 18     General:  Alert and non-toxic, WDWN, NAD HEENT: NCAT, PERLA, EOM normal, no occular discharge or erythema.  Nasal mucosal edema with sinus tenderness to palpation.  Oropharynx clear with minimal oropharyngeal edema and erythema.  Mucous membranes moist and pink. Neck:  Cervical adenopathy Chest:  RRR no MRGs.  Lungs clear to auscultation A&P with no wheezes rhonchi or rales.   Abdomen: +BS x 4 quadrants, soft, non-tender, no guarding, rigidity, or rebound. Skin: warm and dry no rash Neuro: A&Ox4, CN II-XII grossly intact  Assessment and Plan:  1. Acute URI -doxycycline -flonase -allegra -nasal saline -phenergan dextromethorphan

## 2015-07-16 NOTE — Patient Instructions (Signed)
Please take doxycycline in 3 days if no better or symptoms change.  Please use albuterol every six hours as needed for coughing or wheezing.  Use cough syrup as needed.  Use 1 spray of flonase in the morning and 1 spray per nostril in the evening.  Use saline in your nose as often as possible.  Please continue the allegra.  Please call the office if you get any worse.

## 2015-07-24 ENCOUNTER — Ambulatory Visit: Payer: Self-pay | Admitting: Internal Medicine

## 2015-08-21 ENCOUNTER — Encounter: Payer: Self-pay | Admitting: Internal Medicine

## 2015-09-10 ENCOUNTER — Encounter: Payer: Self-pay | Admitting: Internal Medicine

## 2015-09-11 ENCOUNTER — Other Ambulatory Visit: Payer: Self-pay | Admitting: Internal Medicine

## 2015-09-11 MED ORDER — MESALAMINE 400 MG PO TBEC: 400.0000 mg | DELAYED_RELEASE_TABLET | Freq: Two times a day (BID) | ORAL | Status: DC

## 2015-09-12 ENCOUNTER — Other Ambulatory Visit: Payer: Self-pay | Admitting: Internal Medicine

## 2015-09-12 MED ORDER — MIRABEGRON ER 50 MG PO TB24: 50.0000 mg | ORAL_TABLET | Freq: Every day | ORAL | Status: DC

## 2015-09-16 ENCOUNTER — Encounter: Payer: Self-pay | Admitting: *Deleted

## 2015-09-30 ENCOUNTER — Other Ambulatory Visit: Payer: Self-pay | Admitting: Internal Medicine

## 2015-09-30 ENCOUNTER — Encounter: Payer: Self-pay | Admitting: Internal Medicine

## 2015-09-30 MED ORDER — MIRABEGRON ER 50 MG PO TB24: 50.0000 mg | ORAL_TABLET | Freq: Every day | ORAL | Status: DC

## 2015-10-07 ENCOUNTER — Other Ambulatory Visit: Payer: Self-pay | Admitting: Internal Medicine

## 2015-10-07 MED ORDER — MIRABEGRON ER 50 MG PO TB24: 50.0000 mg | ORAL_TABLET | Freq: Every day | ORAL | Status: DC

## 2015-10-16 ENCOUNTER — Ambulatory Visit (INDEPENDENT_AMBULATORY_CARE_PROVIDER_SITE_OTHER): Payer: BLUE CROSS/BLUE SHIELD | Admitting: Internal Medicine

## 2015-10-16 ENCOUNTER — Encounter: Payer: Self-pay | Admitting: Internal Medicine

## 2015-10-16 VITALS — BP 138/74 | HR 86 | Temp 97.0°F | Resp 16 | Ht 63.5 in | Wt 151.4 lb

## 2015-10-16 DIAGNOSIS — I10 Essential (primary) hypertension: Secondary | ICD-10-CM

## 2015-10-16 DIAGNOSIS — E559 Vitamin D deficiency, unspecified: Secondary | ICD-10-CM | POA: Diagnosis not present

## 2015-10-16 DIAGNOSIS — R7303 Prediabetes: Secondary | ICD-10-CM

## 2015-10-16 DIAGNOSIS — Z79899 Other long term (current) drug therapy: Secondary | ICD-10-CM | POA: Diagnosis not present

## 2015-10-16 DIAGNOSIS — E782 Mixed hyperlipidemia: Secondary | ICD-10-CM | POA: Diagnosis not present

## 2015-10-16 DIAGNOSIS — K51819 Other ulcerative colitis with unspecified complications: Secondary | ICD-10-CM

## 2015-10-16 LAB — CBC WITH DIFFERENTIAL/PLATELET
Basophils Absolute: 0 10*3/uL (ref 0.0–0.1)
Basophils Relative: 0 % (ref 0–1)
EOS ABS: 0 10*3/uL (ref 0.0–0.7)
EOS PCT: 0 % (ref 0–5)
HEMATOCRIT: 37.3 % (ref 36.0–46.0)
Hemoglobin: 12.6 g/dL (ref 12.0–15.0)
LYMPHS ABS: 0.9 10*3/uL (ref 0.7–4.0)
Lymphocytes Relative: 10 % — ABNORMAL LOW (ref 12–46)
MCH: 31.5 pg (ref 26.0–34.0)
MCHC: 33.8 g/dL (ref 30.0–36.0)
MCV: 93.3 fL (ref 78.0–100.0)
MONO ABS: 0.7 10*3/uL (ref 0.1–1.0)
MONOS PCT: 8 % (ref 3–12)
MPV: 8.6 fL (ref 8.6–12.4)
Neutro Abs: 7.4 10*3/uL (ref 1.7–7.7)
Neutrophils Relative %: 82 % — ABNORMAL HIGH (ref 43–77)
PLATELETS: 299 10*3/uL (ref 150–400)
RBC: 4 MIL/uL (ref 3.87–5.11)
RDW: 13.1 % (ref 11.5–15.5)
WBC: 9 10*3/uL (ref 4.0–10.5)

## 2015-10-16 LAB — BASIC METABOLIC PANEL WITH GFR
BUN: 12 mg/dL (ref 7–25)
CO2: 28 mmol/L (ref 20–31)
CREATININE: 0.77 mg/dL (ref 0.50–0.99)
Calcium: 9.9 mg/dL (ref 8.6–10.4)
Chloride: 99 mmol/L (ref 98–110)
GFR, Est Non African American: 81 mL/min (ref >=60)
GLUCOSE: 112 mg/dL — AB (ref 65–99)
Potassium: 4.1 mmol/L (ref 3.5–5.3)
Sodium: 139 mmol/L (ref 135–146)

## 2015-10-16 LAB — HEPATIC FUNCTION PANEL
ALT: 14 U/L (ref 6–29)
AST: 19 U/L (ref 10–35)
Albumin: 4.6 g/dL (ref 3.6–5.1)
Alkaline Phosphatase: 58 U/L (ref 33–130)
BILIRUBIN TOTAL: 0.7 mg/dL (ref 0.2–1.2)
Bilirubin, Direct: 0.1 mg/dL (ref <=0.2)
Indirect Bilirubin: 0.6 mg/dL (ref 0.2–1.2)
TOTAL PROTEIN: 6.8 g/dL (ref 6.1–8.1)

## 2015-10-16 LAB — LIPID PANEL
CHOL/HDL RATIO: 2 ratio (ref <=5.0)
CHOLESTEROL: 234 mg/dL — AB (ref 125–200)
HDL: 119 mg/dL (ref >=46)
LDL Cholesterol: 92 mg/dL (ref <130)
TRIGLYCERIDES: 116 mg/dL (ref <150)
VLDL: 23 mg/dL (ref <30)

## 2015-10-16 LAB — TSH: TSH: 1.46 m[IU]/L

## 2015-10-16 NOTE — Progress Notes (Signed)
Assessment and Plan:  Hypertension:  -Continue medication -monitor blood pressure at home. -Continue DASH diet -Reminder to go to the ER if any CP, SOB, nausea, dizziness, severe HA, changes vision/speech, left arm numbness and tingling and jaw pain.  Cholesterol - Continue diet and exercise -Check cholesterol.   PreDiabetes without complications -Continue diet and exercise.  -Check A1C  Vitamin D Def -check level -continue medications.   Ankle stress fracture -followed by Dr. Juline Patch -[place] brace ordered for her  Ulcerative colitis -recommended calling Dr. Lorie Apley office for possible asacol prescription    Continue diet and meds as discussed. Further disposition pending results of labs. Discussed med's effects and SE's.    HPI 65 y.o. female  presents for 3 month follow up with hypertension, hyperlipidemia, diabetes and vitamin D deficiency.   Her blood pressure has been controlled at home, today their BP is BP: 138/74 mmHg.She does workout. She denies chest pain, shortness of breath, dizziness.   She is on cholesterol medication and denies myalgias. Her cholesterol is at goal. The cholesterol was:  04/18/2015: Cholesterol 212*; HDL 104; LDL Cholesterol 86; Triglycerides 112   She has been working on diet and exercise for prediabetes without complications, she is on bASA, she is on ACE/ARB, and denies  foot ulcerations, hyperglycemia, hypoglycemia , increased appetite, nausea, paresthesia of the feet, polydipsia, polyuria, visual disturbances, vomiting and weight loss. Last A1C was: 04/18/2015: Hgb A1c MFr Bld 5.9*   Patient is on Vitamin D supplement. 04/18/2015: Vit D, 25-Hydroxy 70  Patient reports that she has had a lot of eye discharge lately in the middle of the evening.  She reports that it has been both eyes.  She does not have much discharge during the daytime.  She doesn't notice any congestion at night time.  No eye redness, no blurry vision.    She is waiting  on a brace for her ankle.  She reports that she did recently see Dr. Juline Patch.    She does report that she is doing a lot better with the myrebetriq.  She does not have to get up at nighttime as much.  She is currently taking it every time.  She does get some gas too.     Current Medications:  Current Outpatient Prescriptions on File Prior to Visit  Medication Sig Dispense Refill  . albuterol (PROVENTIL HFA;VENTOLIN HFA) 108 (90 BASE) MCG/ACT inhaler Inhale 2 puffs into the lungs every 2 (two) hours as needed for wheezing or shortness of breath (cough). 1 Inhaler 0  . aspirin 81 MG tablet Take 81 mg by mouth daily.    . Calcium Carbonate-Vit D-Min (CALCIUM 1200 PO) Take 2,400 mg by mouth daily.    . cholecalciferol (VITAMIN D) 1000 UNITS tablet Take 3,000 Units by mouth daily.     . Coenzyme Q10 (CO Q 10) 100 MG CAPS Take 200 mg by mouth daily. Quol Ultra CoQ10 159m    . DULoxetine (CYMBALTA) 60 MG capsule Take 1 capsule daily for mood and pain 90 capsule 4  . esomeprazole (NEXIUM) 40 MG capsule Take 1 capsule (40 mg total) by mouth daily. 90 capsule PRN  . fexofenadine (ALLEGRA) 180 MG tablet Take 180 mg by mouth as needed for allergies.     . fluticasone (FLONASE) 50 MCG/ACT nasal spray Place 2 sprays into both nostrils daily. 16 g 0  . loperamide (IMODIUM) 2 MG capsule Take 2 mg by mouth as needed for diarrhea or loose stools.    . mesalamine (  ASACOL) 400 MG EC tablet Take 1 tablet (400 mg total) by mouth 2 (two) times daily. 60 tablet 1  . mesalamine (CANASA) 1000 MG suppository Place 1 suppository (1,000 mg total) rectally at bedtime. 90 suppository 3  . Methylcellulose, Laxative, (CITRUCEL) 500 MG TABS Take 1,000 mg by mouth daily.     . mirabegron ER (MYRBETRIQ) 50 MG TB24 tablet Take 1 tablet (50 mg total) by mouth daily. 90 tablet 0  . olmesartan (BENICAR) 40 MG tablet TAKE 1 TABLET DAILY FOR    BLOOD PRESSURE 90 tablet 3  . OVER THE COUNTER MEDICATION Take 300 mg by mouth daily. Mega  red 384m    . OVER THE COUNTER MEDICATION Take 625 mg by mouth daily. Mega Food Complex C    . Red Yeast Rice Extract 600 MG TABS Take 1,200 mg by mouth 2 (two) times daily.    . verapamil (VERELAN PM) 240 MG 24 hr capsule Take 1 capsule (240 mg total) by mouth daily. 90 capsule PRN   No current facility-administered medications on file prior to visit.   Medical History:  Past Medical History  Diagnosis Date  . History of fusion of cervical spine t  . Fusion of spine of lumbar region t  . Lactose intolerance   . Adult celiac disease   . Fibromyalgia   . Hypertension   . GERD (gastroesophageal reflux disease)   . Proctitis   . [place]   . Diverticulitis   . Factor [place] Leiden [place]   . Concussion     fell jumping off of horse in past 6 months- 09/14/12  . Hyperlipidemia   . DJD (degenerative joint disease)   . DDD (degenerative disc disease)   . Spinal stenosis   . Scoliosis   . Vitamin D deficiency    Allergies:  Allergies  Allergen Reactions  . Augmentin [Amoxicillin-Pot Clavulanate] Diarrhea  . Lyrica [Pregabalin] Swelling    Swelling of feet.  . Meloxicam Other (See Comments)    Intestinal Bleed  . Other Other (See Comments)    Salt-sensitivity Grain-sensitivity Artificial sweetners-sensitivity Sugar-sensitivity  . Wheat Bran Other (See Comments)    Wheat-sensitivity  . Lactose Intolerance (Gi) Other (See Comments)    unknown  . Quinapril Other (See Comments)    unknown  . Vasotec [Enalaprilat] Cough     Review of Systems:  Review of Systems  Constitutional: Negative for fever, chills and malaise/fatigue.  HENT: Negative for congestion, ear pain and sore throat.   Eyes: Positive for discharge.  Respiratory: Negative for cough, shortness of breath and wheezing.   Cardiovascular: Negative for chest pain, palpitations and leg swelling.  Gastrointestinal: Positive for blood in stool. Negative for heartburn, diarrhea, constipation and melena.   Genitourinary: Negative.   Neurological: Negative for dizziness, sensory change, loss of consciousness and headaches.  Psychiatric/Behavioral: Positive for depression. The patient is not nervous/anxious and does not have insomnia.     Family history- Review and unchanged  Social history- Review and unchanged  Physical Exam: BP 138/74 mmHg  Pulse 86  Temp(Src) 97 F (36.1 C) (Temporal)  Resp 16  Ht 5' 3.5" (1.613 m)  Wt 151 lb 6.4 oz (68.675 kg)  BMI 26.40 kg/m2  SpO2 96% Wt Readings from Last 3 Encounters:  10/16/15 151 lb 6.4 oz (68.675 kg)  07/16/15 150 lb (68.04 kg)  04/18/15 148 lb (67.132 kg)   General Appearance: Well nourished well developed, non-toxic appearing, in no apparent distress. Eyes: PERRLA, EOMs,  conjunctiva no swelling or erythema ENT/Mouth: Ear canals clear with no erythema, swelling, or discharge.  TMs normal bilaterally, oropharynx clear, moist, with no exudate.   Neck: Supple, thyroid normal, no JVD, no cervical adenopathy.  Respiratory: Respiratory effort normal, breath sounds clear A&P, no wheeze, rhonchi or rales noted.  No retractions, no accessory muscle usage Cardio: RRR with no MRGs. No noted edema.  Abdomen: Soft, + BS.  Non tender, no guarding, rebound, hernias, masses. Musculoskeletal: Full ROM, 5/5 strength, Normal gait Skin: Warm, dry without rashes, lesions, ecchymosis.  Neuro: Awake and oriented X 3, Cranial nerves intact. No cerebellar symptoms.  Psych: normal affect, Insight and Judgment appropriate.    Starlyn Skeans, PA-C 2:51 PM [facility]

## 2015-10-17 LAB — HEMOGLOBIN A1C
Hgb A1c MFr Bld: 5.9 % — ABNORMAL HIGH (ref <5.7)
Mean Plasma Glucose: 123 mg/dL — ABNORMAL HIGH (ref <117)

## 2015-10-22 ENCOUNTER — Encounter: Payer: Self-pay | Admitting: Internal Medicine

## 2015-11-29 ENCOUNTER — Other Ambulatory Visit: Payer: Self-pay | Admitting: Obstetrics and Gynecology

## 2015-11-29 DIAGNOSIS — Z1231 Encounter for screening mammogram for malignant neoplasm of breast: Secondary | ICD-10-CM

## 2015-11-29 DIAGNOSIS — R5381 Other malaise: Secondary | ICD-10-CM

## 2015-11-30 ENCOUNTER — Other Ambulatory Visit: Payer: Self-pay | Admitting: Internal Medicine

## 2015-12-17 ENCOUNTER — Other Ambulatory Visit: Payer: Self-pay | Admitting: Obstetrics and Gynecology

## 2015-12-17 DIAGNOSIS — Z780 Asymptomatic menopausal state: Secondary | ICD-10-CM

## 2015-12-31 ENCOUNTER — Other Ambulatory Visit: Payer: Self-pay | Admitting: Internal Medicine

## 2016-01-13 ENCOUNTER — Other Ambulatory Visit: Payer: Self-pay | Admitting: Internal Medicine

## 2016-01-16 ENCOUNTER — Encounter: Payer: Self-pay | Admitting: Internal Medicine

## 2016-01-22 ENCOUNTER — Encounter: Payer: Self-pay | Admitting: Internal Medicine

## 2016-01-22 ENCOUNTER — Ambulatory Visit (INDEPENDENT_AMBULATORY_CARE_PROVIDER_SITE_OTHER): Payer: BLUE CROSS/BLUE SHIELD | Admitting: Internal Medicine

## 2016-01-22 VITALS — BP 118/80 | HR 84 | Temp 97.4°F | Resp 16 | Ht 64.0 in | Wt 152.2 lb

## 2016-01-22 DIAGNOSIS — E559 Vitamin D deficiency, unspecified: Secondary | ICD-10-CM

## 2016-01-22 DIAGNOSIS — E782 Mixed hyperlipidemia: Secondary | ICD-10-CM

## 2016-01-22 DIAGNOSIS — Z1212 Encounter for screening for malignant neoplasm of rectum: Secondary | ICD-10-CM

## 2016-01-22 DIAGNOSIS — R6889 Other general symptoms and signs: Secondary | ICD-10-CM | POA: Diagnosis not present

## 2016-01-22 DIAGNOSIS — Z23 Encounter for immunization: Secondary | ICD-10-CM | POA: Diagnosis not present

## 2016-01-22 DIAGNOSIS — D6851 Activated protein C resistance: Secondary | ICD-10-CM

## 2016-01-22 DIAGNOSIS — Z79899 Other long term (current) drug therapy: Secondary | ICD-10-CM | POA: Diagnosis not present

## 2016-01-22 DIAGNOSIS — Z136 Encounter for screening for cardiovascular disorders: Secondary | ICD-10-CM | POA: Diagnosis not present

## 2016-01-22 DIAGNOSIS — Z0001 Encounter for general adult medical examination with abnormal findings: Secondary | ICD-10-CM | POA: Diagnosis not present

## 2016-01-22 DIAGNOSIS — R5383 Other fatigue: Secondary | ICD-10-CM

## 2016-01-22 DIAGNOSIS — K5100 Ulcerative (chronic) pancolitis without complications: Secondary | ICD-10-CM

## 2016-01-22 DIAGNOSIS — I10 Essential (primary) hypertension: Secondary | ICD-10-CM | POA: Diagnosis not present

## 2016-01-22 DIAGNOSIS — K219 Gastro-esophageal reflux disease without esophagitis: Secondary | ICD-10-CM

## 2016-01-22 DIAGNOSIS — R7303 Prediabetes: Secondary | ICD-10-CM

## 2016-01-22 LAB — CBC WITH DIFFERENTIAL/PLATELET
BASOS PCT: 0 %
Basophils Absolute: 0 cells/uL (ref 0–200)
EOS PCT: 0 %
Eosinophils Absolute: 0 cells/uL — ABNORMAL LOW (ref 15–500)
HEMATOCRIT: 38.4 % (ref 35.0–45.0)
Hemoglobin: 12.7 g/dL (ref 11.7–15.5)
LYMPHS PCT: 10 %
Lymphs Abs: 1200 cells/uL (ref 850–3900)
MCH: 30.8 pg (ref 27.0–33.0)
MCHC: 33.1 g/dL (ref 32.0–36.0)
MCV: 93 fL (ref 80.0–100.0)
MONO ABS: 1080 {cells}/uL — AB (ref 200–950)
MONOS PCT: 9 %
MPV: 9.4 fL (ref 7.5–12.5)
Neutro Abs: 9720 cells/uL — ABNORMAL HIGH (ref 1500–7800)
Neutrophils Relative %: 81 %
PLATELETS: 267 10*3/uL (ref 140–400)
RBC: 4.13 MIL/uL (ref 3.80–5.10)
RDW: 14.3 % (ref 11.0–15.0)
WBC: 12 10*3/uL — AB (ref 3.8–10.8)

## 2016-01-22 LAB — VITAMIN B12: Vitamin B-12: 1249 pg/mL — ABNORMAL HIGH (ref 200–1100)

## 2016-01-22 LAB — TSH: TSH: 1.39 m[IU]/L

## 2016-01-22 NOTE — Progress Notes (Signed)
Patient ID: Shaana Acocella, female   DOB: 27-Aug-1950, 65 y.o.   MRN: [id number]  [facility]                   Unk Pinto, M.D.    Uvaldo Bristle. Silverio Lay, P.A.-C      Starlyn Skeans, P.A.-C   [facility]                [address] [place], [place] [address] Telephone [phone] Telefax [phone]  ______________________________________________________________________  Annual Screening/Preventative Visit And Comprehensive Evaluation &  Examination     This very nice 65 y.o. MWF presents for a Wellness/Preventative Visit & comprehensive evaluation and management of multiple medical co-morbidities.  Patient has been followed for HTN, Prediabetes, Hyperlipidemia and Vitamin D Deficiency. Other problems include Ulcerative Colitis being followed by Dr Collene Mares. Also she had Lumbar DDD and has undergone neck & back surg and has ongoing problems with chronic pains.       HTN predates since 1997. Patient's BP has been controlled at home and patient denies any cardiac symptoms as chest pain, palpitations, shortness of breath, dizziness or ankle swelling. Today's BP is 118/80 mmHg      Patient's hyperlipidemia is controlled with diet and medications. Patient denies myalgias or other medication SE's. Last lipids were at goal with  Cholesterol 234*; HDL 119; LDL 92; Triglycerides 116 on 10/16/2015.     Patient has prediabetes predating since Oct 2013 with A1c 5.7%  and patient denies reactive hypoglycemic symptoms, visual blurring, diabetic polys, or paresthesias. Last A1c was 5.9% on 5 /22/2017.      Finally, patient has history of Vitamin D Deficiency of "28" in 2008  and last Vitamin D was  70 on 04/18/2015.    Medication Sig  . Calcium 1200-Vit D-Min  Take 2,400 mg by mouth daily.  [name] VITAMIN D 1000 UNITS Take 3,000 Units by mouth daily.   . Coenzyme Q10  100 MG  Take 200 mg by mouth daily. Quol Ultra CoQ10 128m  .  DULoxetine  60 MG TAKE 1 CAPSULE DAILY FOR   MOOD AND PAIN  . esomeprazole  40 MG  Take 1 capsule (40 mg total) by mouth daily.  . fexofenadine  180 MG  Take 180 mg by mouth as needed for allergies.   .Marland KitchenFLONASE nasal spray Place 2 sprays into both nostrils daily.  .Marland Kitchenloperamide  2 MG Take 2 mg by mouth as needed for diarrhea or loose stools.  . mesalamine (CANASA) 1000 MG supp Place 1 suppository (1,000 mg total) rectally at bedtime.  .Marland KitchenCITRUCEL 500 MG  Take 1,000 mg by mouth daily.   .Marland KitchenMYRBETRIQ 50 MG TB24  TAKE 1 TABLET DAILY  . olmesartan 40 MG TAKE 1 TABLET DAILY FOR    BLOOD PRESSURE  . Mega red 3065mTake 300 mg by mouth daily.   . Coletta Memosood Complex C Take 625 mg by mouth daily.  . Red Yeast Rice Extract 600 MG Take 1,200 mg by mouth 2 (two) times daily.  . verapamil PM) 240 MG  Take 1 capsule (240 mg total) by mouth daily.   Allergies  Allergen Reactions  . Augmentin [Amoxicillin-Pot Clavulanate] Diarrhea  . Lyrica [Pregabalin] Swelling    Swelling of feet.  . Meloxicam Other (See Comments)    Intestinal Bleed  . Other Other (  See Comments)    Salt-sensitivity Grain-sensitivity Artificial sweetners-sensitivity Sugar-sensitivity  . Wheat Bran Other (See Comments)    Wheat-sensitivity  . Lactose Intolerance (Gi) Other (See Comments)    unknown  . Quinapril Other (See Comments)    unknown  . Vasotec [Enalaprilat] Cough   Past Medical History  Diagnosis Date  . History of fusion of cervical spine t  . Fusion of spine of lumbar region t  . Lactose intolerance   . Adult celiac disease   . Fibromyalgia   . Hypertension   . GERD    . Proctitis   . Ulcerative colitis ([place])   . Diverticulitis   . Factor V Leiden ([place])   . Concussion     fell jumping off of horse in past 6 months- 09/14/12  . Hyperlipidemia   . DJD (degenerative joint disease)   . DDD (degenerative disc disease)   . Spinal stenosis   . Scoliosis   . Vitamin D deficiency    Health Maintenance  Topic  Date Due  . Hepatitis C Screening  08-May-1951  . TETANUS/TDAP  01/19/1968  . MAMMOGRAM  01/19/1999  . COLONOSCOPY  01/19/1999  . ZOSTAVAX  01/18/2009  . DEXA SCAN  01/18/2014  . PNA vac Low Risk Adult (1 of 2 - PCV13) 01/18/2014  . INFLUENZA VACCINE  02/25/2016   Past Surgical History  Procedure Laterality Date  . Tubal ligation  1983  . Foot surgery Right 1978    Reconstruction after faliure of emergency stitching  . Neck surgery  2002    anterior cervical fusion  . Lasik    . Eye surgery    . Anterior lateral lumbar fusion 4 levels  - Erline Levine, MD N/A 09/22/2012  . Chest tube insertion Left 09/22/2012    Procedure: CHEST TUBE INSERTION;   Dr. Cyndia Bent  . Back surgery  - disectomy  2006, 2008  .  LAP  Cholecystectomy  - Harl Bowie, MD N/A 11/22/2013   Family History  Problem Relation Age of Onset  . Prostate cancer Father   . Cancer Father     skin cancer, prostate cancer with non-hodkins lymphoma  . Heart attack Brother 32    CABG  . Hypertension Brother   . Heart disease Brother   . Heart attack Brother 79  . Heart failure Mother   . Stroke Mother   . Hypertension Mother   . Heart attack Mother    Social History  Substance Use Topics  . Smoking status: Former Smoker -- 1.00 packs/day for 24 years    Types: Cigarettes    Quit date: 07/28/1990  . Smokeless tobacco: None  . Alcohol Use: 4.2 oz/week    7 Glasses of wine per week    ROS Constitutional: Denies fever, chills, weight loss/gain, headaches, insomnia,  night sweats, and change in appetite. Does c/o fatigue. Eyes: Denies redness, blurred vision, diplopia, discharge, itchy, watery eyes.  ENT: Denies discharge, congestion, post nasal drip, epistaxis, sore throat, earache, hearing loss, dental pain, Tinnitus, Vertigo, Sinus pain, snoring.  Cardio: Denies chest pain, palpitations, irregular heartbeat, syncope, dyspnea, diaphoresis, orthopnea, PND, claudication, edema Respiratory: denies cough, dyspnea,  DOE, pleurisy, hoarseness, laryngitis, wheezing.  Gastrointestinal: Denies dysphagia, heartburn, reflux, water brash,  nausea, vomiting, bloating, constipation, hematemesis, melena, hematochezia, jaundice, hemorrhoids. Intermittantly having flare sx's of her UC with cramping and diarrgea whih are improving on a higher dose of her Mesalamine.  Genitourinary: Denies dysuria, frequency, urgency, nocturia, hesitancy, discharge, hematuria, flank pain  Breast: Breast lumps, nipple discharge, bleeding.  Musculoskeletal: Denies arthralgia, myalgia, stiffness, Jt. Swelling, pain, limp, and strain/sprain. Denies falls. Skin: Denies puritis, rash, hives, warts, acne, eczema, changing in skin lesion Neuro: No weakness, tremor, incoordination, spasms, paresthesia, pain Psychiatric: Denies confusion, memory loss, sensory loss. Denies Depression. Endocrine: Denies change in weight, skin, hair change, nocturia, and paresthesia, diabetic polys, visual blurring, hyper / hypo glycemic episodes.  Heme/Lymph: No excessive bleeding, bruising, enlarged lymph nodes.  Physical Exam  BP 118/80 mmHg  Pulse 84  Temp(Src) 97.4 F (36.3 C)  Resp 16  Ht 5' 4"  (1.626 m)  Wt 152 lb 3.2 oz (69.037 kg)  BMI 26.11 kg/m2  General Appearance: Well nourished and in no apparent distress. Eyes: PERRLA, EOMs, conjunctiva no swelling or erythema, normal fundi and vessels. Sinuses: No frontal/maxillary tenderness ENT/Mouth: EACs patent / TMs  nl. Nares clear without erythema, swelling, mucoid exudates. Oral hygiene is good. No erythema, swelling, or exudate. Tongue normal, non-obstructing. Tonsils not swollen or erythematous. Hearing normal.  Neck: Supple, thyroid normal. No bruits, nodes or JVD. Respiratory: Respiratory effort normal.  BS equal and clear bilateral without rales, rhonci, wheezing or stridor. Cardio: Heart sounds are normal with regular rate and rhythm and no murmurs, rubs or gallops. Peripheral pulses are normal and  equal bilaterally without edema. No aortic or femoral bruits. Chest: symmetric with normal excursions and percussion. Breasts: Deferred - done recently by GYN.  Abdomen: Flat, soft with bowel sounds active. Nontender, no guarding, rebound, hernias, masses, or organomegaly.  Lymphatics: Non tender without lymphadenopathy.  Musculoskeletal: Full ROM all peripheral extremities, joint stability, 5/5 strength, and normal gait. Skin: Warm and dry without rashes, lesions, cyanosis, clubbing or  ecchymosis.  Neuro: Cranial nerves intact, reflexes equal bilaterally. Normal muscle tone, no cerebellar symptoms. Sensation intact.  Pysch: Alert and oriented X 3, normal affect, Insight and Judgment appropriate.   Assessment and Plan  1. Annual Preventative Screening Examination  - Microalbumin / creatinine urine ratio - EKG 12-Lead - [place], RETROPERITNL ABD,  LTD - POC Hemoccult Bld/Stl  - Urinalysis, Routine w reflex microscopic  - Vitamin B12 - Iron and TIBC - CBC with Differential/Platelet - BASIC METABOLIC PANEL WITH GFR - Hepatic function panel - Magnesium - Lipid panel - TSH - Hemoglobin A1c - Insulin, random - VITAMIN D 25 Hydroxy   2. Essential hypertension  - Microalbumin / creatinine urine ratio - EKG 12-Lead - [place], RETROPERITNL ABD,  LTD - TSH  3. Hyperlipidemia  - Lipid panel - TSH  4. Prediabetes  - Hemoglobin A1c - Insulin, random  5. Vitamin D deficiency  - VITAMIN D 25 Hydroxy   6. Factor 5 Leiden mutation, heterozygous ([place])   7. Gastroesophageal reflux disease   8. Ulcerative pancolitis without complication ([place])   9. Screening for rectal cancer  - POC Hemoccult Bld/Stl  10. Other fatigue  - Vitamin B12 - Iron and TIBC - CBC with Differential/Platelet - TSH  11. Medication management  - Urinalysis, Routine w reflex microscopic) - CBC with Differential/Platelet - BASIC METABOLIC PANEL WITH GFR - Hepatic function panel - Magnesium  12.  Need for prophylactic vaccination with tetanus-diphtheria (TD)  - Td : Tetanus/diphtheria >7yo Preservative  free   Continue prudent diet as discussed, weight control, BP monitoring, regular exercise, and medications. Discussed med's effects and SE's. Screening labs and tests as requested with regular follow-up as recommended. Over 40 minutes of exam, counseling, chart review and high complex critical decision making was performed.

## 2016-01-22 NOTE — Patient Instructions (Signed)
Recommend Adult Low Dose Aspirin or   coated  Aspirin 81 mg daily   To reduce risk of Colon Cancer 20 %,   Skin Cancer 26 % ,   Melanoma 46%   and   Pancreatic cancer 60%   ++++++++++++++++++++++++++++++++++++++++++++++++++++++ Vitamin D goal   is between 70-100.   Please make sure that you are taking your Vitamin D as directed.   It is very important as a natural anti-inflammatory   helping hair, skin, and nails, as well as reducing stroke and heart attack risk.   It helps your bones and helps with mood.  It also decreases numerous cancer risks so please take it as directed.   Low Vit D is associated with a 200-300% higher risk for CANCER   and 200-300% higher risk for HEART   ATTACK  &  STROKE.   .....................................[name]  It is also associated with higher death rate at younger ages,   autoimmune diseases like Rheumatoid arthritis, Lupus, Multiple Sclerosis.     Also many other serious conditions, like depression, Alzheimer's  Dementia, infertility, muscle aches, fatigue, fibromyalgia - just to name a few.  ++++++++++++++++++++++++++++++++++++++++++++++++  Recommend the book "The END of DIETING" by Dr Excell Seltzer   & the book "The END of DIABETES " by Dr Excell Seltzer  At [email or web address] - get book & Audio CD's     Being diabetic has a  300% increased risk for heart attack, stroke, cancer, and alzheimer- type vascular dementia. It is very important that you work harder with diet by avoiding all foods that are white. Avoid white rice (brown & wild rice is OK), white potatoes (sweetpotatoes in moderation is OK), White bread or wheat bread or anything made out of white flour like bagels, donuts, rolls, buns, biscuits, cakes, pastries, cookies, pizza crust, and pasta (made from white flour & egg whites) - vegetarian pasta or spinach or wheat pasta is OK. Multigrain breads like Arnold's or Pepperidge [employer], or multigrain sandwich thins or flatbreads.  Diet,  exercise and weight loss can reverse and cure diabetes in the early stages.  Diet, exercise and weight loss is very important in the control and prevention of complications of diabetes which affects every system in your body, ie. Brain - dementia/stroke, eyes - glaucoma/blindness, heart - heart attack/heart failure, kidneys - dialysis, stomach - gastric paralysis, intestines - malabsorption, nerves - severe painful neuritis, circulation - gangrene & loss of a leg(s), and finally cancer and Alzheimers.    I recommend avoid fried & greasy foods,  sweets/candy, white rice (brown or wild rice or Quinoa is OK), white potatoes (sweet potatoes are OK) - anything made from white flour - bagels, doughnuts, rolls, buns, biscuits,white and wheat breads, pizza crust and traditional pasta made of white flour & egg white(vegetarian pasta or spinach or wheat pasta is OK).  Multi-grain bread is OK - like multi-grain flat bread or sandwich thins. Avoid alcohol in excess. Exercise is also important.    Eat all the vegetables you want - avoid meat, especially red meat and dairy - especially cheese.  Cheese is the most concentrated form of trans-fats which is the worst thing to clog up our arteries. Veggie cheese is OK which can be found in the fresh produce section at [employer] or [employer] or [employer]  ++++++++++++++++++++++++++++++++++++++++++++++++++ DASH Eating Plan  DASH stands for "Dietary Approaches to Stop Hypertension."   The DASH eating plan is a healthy eating plan that has been shown to reduce high blood  pressure (hypertension). Additional health benefits may include reducing the risk of type 2 diabetes mellitus, heart disease, and stroke. The DASH eating plan may also help with weight loss.  WHAT DO I NEED TO KNOW ABOUT THE DASH EATING PLAN?  For the DASH eating plan, you will follow these general guidelines:  Choose foods with a percent daily value for sodium of less than 5% (as listed on the food  label).  Use salt-free seasonings or herbs instead of table salt or sea salt.  Check with your health care provider or pharmacist before using salt substitutes.  Eat lower-sodium products, often labeled as "lower sodium" or "no salt added."  Eat fresh foods.  Eat more vegetables, fruits, and low-fat dairy products.    Choose whole grains. Look for the word "whole" as the first word in the ingredient list.  Choose fish   Limit sweets, desserts, sugars, and sugary drinks.  Choose heart-healthy fats.  Eat veggie cheese   Eat more home-cooked food and less restaurant, buffet, and fast food.  Limit fried foods.  Huffaker foods using methods other than frying.  Limit canned vegetables. If you do use them, rinse them well to decrease the sodium.  When eating at a restaurant, ask that your food be prepared with less salt, or no salt if possible.                      WHAT FOODS CAN I EAT?  Read Dr Fara Olden Fuhrman's books on The End of Dieting & The End of Diabetes  Grains  Whole grain or whole wheat bread. Brown rice. Whole grain or whole wheat pasta. Quinoa, bulgur, and whole grain cereals. Low-sodium cereals. Corn or whole wheat flour tortillas. Whole grain cornbread. Whole grain crackers. Low-sodium crackers.  Vegetables  Fresh or frozen vegetables (raw, steamed, roasted, or grilled). Low-sodium or reduced-sodium tomato and vegetable juices. Low-sodium or reduced-sodium tomato sauce and paste. Low-sodium or reduced-sodium canned vegetables.   Fruits  All fresh, canned (in natural juice), or frozen fruits.  Protein Products   All fish and seafood.  Dried beans, peas, or lentils. Unsalted nuts and seeds. Unsalted canned beans.  Dairy  Low-fat dairy products, such as skim or 1% milk, 2% or reduced-fat cheeses, low-fat ricotta or cottage cheese, or plain low-fat yogurt. Low-sodium or reduced-sodium cheeses.  Fats and Oils  Tub margarines without trans fats. Light or  reduced-fat mayonnaise and salad dressings (reduced sodium). Avocado. Safflower, olive, or canola oils. Natural peanut or almond butter.  Other  Unsalted popcorn and pretzels. The items listed above may not be a complete list of recommended foods or beverages. Contact your dietitian for more options.  +++++++++++++++++++++++++++++++++++++++++++  WHAT FOODS ARE NOT RECOMMENDED?  Grains/ White flour or wheat flour  White bread. White pasta. White rice. Refined cornbread. Bagels and croissants. Crackers that contain trans fat.  Vegetables  Creamed or fried vegetables. Vegetables in a . Regular canned vegetables. Regular canned tomato sauce and paste. Regular tomato and vegetable juices.  Fruits  Dried fruits. Canned fruit in light or heavy syrup. Fruit juice.  Meat and Other Protein Products  Meat in general - RED mwaet & White meat.  Fatty cuts of meat. Ribs, chicken wings, bacon, sausage, bologna, salami, chitterlings, fatback, hot dogs, bratwurst, and packaged luncheon meats.  Dairy  Whole or 2% milk, cream, half-and-half, and cream cheese. Whole-fat or sweetened yogurt. Full-fat cheeses or blue cheese. Nondairy creamers and whipped toppings. Processed cheese, cheese spreads, or  cheese curds.  Condiments  Onion and garlic salt, seasoned salt, table salt, and sea salt. Canned and packaged gravies. Worcestershire sauce. Tartar sauce. Barbecue sauce. Teriyaki sauce. Soy sauce, including reduced sodium. Steak sauce. Fish sauce. Oyster sauce. Cocktail sauce. Horseradish. Ketchup and mustard. Meat flavorings and tenderizers. Bouillon cubes. Hot sauce. Tabasco sauce. Marinades. Taco seasonings. Relishes.  Fats and Oils Butter, stick margarine, lard, shortening and bacon fat. Coconut, palm kernel, or palm oils. Regular salad dressings.  Pickles and olives. Salted popcorn and pretzels.  The items listed above may not be a complete list of foods and beverages to avoid.   Preventive  Care for Adults  A healthy lifestyle and preventive care can promote health and wellness. Preventive health guidelines for women include the following key practices.  A routine yearly physical is a good way to check with your health care provider about your health and preventive screening. It is a chance to share any concerns and updates on your health and to receive a thorough exam.  Visit your dentist for a routine exam and preventive care every 6 months. Brush your teeth twice a day and floss once a day. Good oral hygiene prevents tooth decay and gum disease.  The frequency of eye exams is based on your age, health, family medical history, use of contact lenses, and other factors. Follow your health care provider's recommendations for frequency of eye exams.  Eat a healthy diet. Foods like vegetables, fruits, whole grains, low-fat dairy products, and lean protein foods contain the nutrients you need without too many calories. Decrease your intake of foods high in solid fats, added sugars, and salt. Eat the right amount of calories for you.Get information about a proper diet from your health care provider, if necessary.  Regular physical exercise is one of the most important things you can do for your health. Most adults should get at least 150 minutes of moderate-intensity exercise (any activity that increases your heart rate and causes you to sweat) each week. In addition, most adults need muscle-strengthening exercises on 2 or more days a week.  Maintain a healthy weight. The body mass index (BMI) is a screening tool to identify possible weight problems. It provides an estimate of body fat based on height and weight. Your health care provider can find your BMI and can help you achieve or maintain a healthy weight.For adults 20 years and older:  A BMI below 18.5 is considered underweight.  A BMI of 18.5 to 24.9 is normal.  A BMI of 25 to 29.9 is considered overweight.  A BMI of 30 and  above is considered obese.  Maintain normal blood lipids and cholesterol levels by exercising and minimizing your intake of saturated fat. Eat a balanced diet with plenty of fruit and vegetables. If your lipid or cholesterol levels are high, you are over 50, or you are at high risk for heart disease, you may need your cholesterol levels checked more frequently.Ongoing high lipid and cholesterol levels should be treated with medicines if diet and exercise are not working.  If you smoke, find out from your health care provider how to quit. If you do not use tobacco, do not start.  Lung cancer screening is recommended for adults aged 56-80 years who are at high risk for developing lung cancer because of a history of smoking. A yearly low-dose CT scan of the lungs is recommended for people who have at least a 30-pack-year history of smoking and are a current smoker or  have quit within the past 15 years. A pack year of smoking is smoking an average of 1 pack of cigarettes a day for 1 year (for example: 1 pack a day for 30 years or 2 packs a day for 15 years). Yearly screening should continue until the smoker has stopped smoking for at least 15 years. Yearly screening should be stopped for people who develop a health problem that would prevent them from having lung cancer treatment.  Avoid use of street drugs. Do not share needles with anyone. Ask for help if you need support or instructions about stopping the use of drugs.  High blood pressure causes heart disease and increases the risk of stroke.  Ongoing high blood pressure should be treated with medicines if weight loss and exercise do not work.  If you are 52-67 years old, ask your health care provider if you should take aspirin to prevent strokes.  Diabetes screening involves taking a blood sample to check your fasting blood sugar level. This should be done once every 3 years, after age 9, if you are within normal weight and without risk factors for  diabetes. Testing should be considered at a younger age or be carried out more frequently if you are overweight and have at least 1 risk factor for diabetes.  Breast cancer screening is essential preventive care for women. You should practice "breast self-awareness." This means understanding the normal appearance and feel of your breasts and may include breast self-examination. Any changes detected, no matter how small, should be reported to a health care provider. Women in their 45s and 30s should have a clinical breast exam (CBE) by a health care provider as part of a regular health exam every 1 to 3 years. After age 76, women should have a CBE every year. Starting at age 54, women should consider having a mammogram (breast X-ray test) every year. Women who have a family history of breast cancer should talk to their health care provider about genetic screening. Women at a high risk of breast cancer should talk to their health care providers about having an MRI and a mammogram every year.  Breast cancer gene (BRCA)-related cancer risk assessment is recommended for women who have family members with BRCA-related cancers. BRCA-related cancers include breast, ovarian, tubal, and peritoneal cancers. Having family members with these cancers may be associated with an increased risk for harmful changes (mutations) in the breast cancer genes BRCA1 and BRCA2. Results of the assessment will determine the need for genetic counseling and BRCA1 and BRCA2 testing.  Routine pelvic exams to screen for cancer are no longer recommended for nonpregnant women who are considered low risk for cancer of the pelvic organs (ovaries, uterus, and vagina) and who do not have symptoms. Ask your health care provider if a screening pelvic exam is right for you.  If you have had past treatment for cervical cancer or a condition that could lead to cancer, you need Pap tests and screening for cancer for at least 20 years after your  treatment. If Pap tests have been discontinued, your risk factors (such as having a new sexual partner) need to be reassessed to determine if screening should be resumed. Some women have medical problems that increase the chance of getting cervical cancer. In these cases, your health care provider may recommend more frequent screening and Pap tests.    Colorectal cancer can be detected and often prevented. Most routine colorectal cancer screening begins at the age of 73 years and  continues through age 75 years. However, your health care provider may recommend screening at an earlier age if you have risk factors for colon cancer. On a yearly basis, your health care provider may provide home test kits to check for hidden blood in the stool. Use of a small camera at the end of a tube, to directly examine the colon (sigmoidoscopy or colonoscopy), can detect the earliest forms of colorectal cancer. Talk to your health care provider about this at age 50, when routine screening begins. Direct exam of the colon should be repeated every 5-10 years through age 75 years, unless early forms of pre-cancerous polyps or small growths are found.  Osteoporosis is a disease in which the bones lose minerals and strength with aging. This can result in serious bone fractures or breaks. The risk of osteoporosis can be identified using a bone density scan. Women ages 65 years and over and women at risk for fractures or osteoporosis should discuss screening with their health care providers. Ask your health care provider whether you should take a calcium supplement or vitamin D to reduce the rate of osteoporosis.  Menopause can be associated with physical symptoms and risks. Hormone replacement therapy is available to decrease symptoms and risks. You should talk to your health care provider about whether hormone replacement therapy is right for you.  Use sunscreen. Apply sunscreen liberally and repeatedly throughout the day. You  should seek shade when your shadow is shorter than you. Protect yourself by wearing long sleeves, pants, a wide-brimmed hat, and sunglasses year round, whenever you are outdoors.  Once a month, do a whole body skin exam, using a mirror to look at the skin on your back. Tell your health care provider of new moles, moles that have irregular borders, moles that are larger than a pencil eraser, or moles that have changed in shape or color.  Stay current with required vaccines (immunizations).  Influenza vaccine. All adults should be immunized every year.  Tetanus, diphtheria, and acellular pertussis (Td, Tdap) vaccine. Pregnant women should receive 1 dose of Tdap vaccine during each pregnancy. The dose should be obtained regardless of the length of time since the last dose. Immunization is preferred during the 27th-36th week of gestation. An adult who has not previously received Tdap or who does not know her vaccine status should receive 1 dose of Tdap. This initial dose should be followed by tetanus and diphtheria toxoids (Td) booster doses every 10 years. Adults with an unknown or incomplete history of completing a 3-dose immunization series with Td-containing vaccines should begin or complete a primary immunization series including a Tdap dose. Adults should receive a Td booster every 10 years.    Zoster vaccine. One dose is recommended for adults aged 60 years or older unless certain conditions are present.    Pneumococcal 13-valent conjugate (PCV13) vaccine. When indicated, a person who is uncertain of her immunization history and has no record of immunization should receive the PCV13 vaccine. An adult aged 19 years or older who has certain medical conditions and has not been previously immunized should receive 1 dose of PCV13 vaccine. This PCV13 should be followed with a dose of pneumococcal polysaccharide (PPSV23) vaccine. The PPSV23 vaccine dose should be obtained at least 8 weeks after the dose  of PCV13 vaccine. An adult aged 19 years or older who has certain medical conditions and previously received 1 or more doses of PPSV23 vaccine should receive 1 dose of PCV13. The PCV13 vaccine dose should   be obtained 1 or more years after the last PPSV23 vaccine dose.    Pneumococcal polysaccharide (PPSV23) vaccine. When PCV13 is also indicated, PCV13 should be obtained first. All adults aged 65 years and older should be immunized. An adult younger than age 65 years who has certain medical conditions should be immunized. Any person who resides in a nursing home or long-term care facility should be immunized. An adult smoker should be immunized. People with an immunocompromised condition and certain other conditions should receive both PCV13 and PPSV23 vaccines. People with human immunodeficiency virus (HIV) infection should be immunized as soon as possible after diagnosis. Immunization during chemotherapy or radiation therapy should be avoided. Routine use of PPSV23 vaccine is not recommended for American Indians, [place] Natives, or people younger than 65 years unless there are medical conditions that require PPSV23 vaccine. When indicated, people who have unknown immunization and have no record of immunization should receive PPSV23 vaccine. One-time revaccination 5 years after the first dose of PPSV23 is recommended for people aged 19-64 years who have chronic kidney failure, nephrotic syndrome, asplenia, or immunocompromised conditions. People who received 1-2 doses of PPSV23 before age 65 years should receive another dose of PPSV23 vaccine at age 65 years or later if at least 5 years have passed since the previous dose. Doses of PPSV23 are not needed for people immunized with PPSV23 at or after age 65 years.   Preventive Services / Frequency  Ages 65 years and over  Blood pressure check.  Lipid and cholesterol check.  Lung cancer screening. / Every year if you are aged 55-80 years and have a  30-pack-year history of smoking and currently smoke or have quit within the past 15 years. Yearly screening is stopped once you have quit smoking for at least 15 years or develop a health problem that would prevent you from having lung cancer treatment.  Clinical breast exam.** / Every year after age 40 years.  BRCA-related cancer risk assessment.** / For women who have family members with a BRCA-related cancer (breast, ovarian, tubal, or peritoneal cancers).  Mammogram.** / Every year beginning at age 40 years and continuing for as long as you are in good health. Consult with your health care provider.  Pap test.** / Every 3 years starting at age 30 years through age 65 or 70 years with 3 consecutive normal Pap tests. Testing can be stopped between 65 and 70 years with 3 consecutive normal Pap tests and no abnormal Pap or HPV tests in the past 10 years.  Fecal occult blood test (FOBT) of stool. / Every year beginning at age 50 years and continuing until age 75 years. You may not need to do this test if you get a colonoscopy every 10 years.  Flexible sigmoidoscopy or colonoscopy.** / Every 5 years for a flexible sigmoidoscopy or every 10 years for a colonoscopy beginning at age 50 years and continuing until age 75 years.  Hepatitis C blood test.** / For all people born from 1945 through 1965 and any individual with known risks for hepatitis C.  Osteoporosis screening.** / A one-time screening for women ages 65 years and over and women at risk for fractures or osteoporosis.  Skin self-exam. / Monthly.  Influenza vaccine. / Every year.  Tetanus, diphtheria, and acellular pertussis (Tdap/Td) vaccine.** / 1 dose of Td every 10 years.  Zoster vaccine.** / 1 dose for adults aged 60 years or older.  Pneumococcal 13-valent conjugate (PCV13) vaccine.** / Consult your health care provider.    Pneumococcal polysaccharide (PPSV23) vaccine.** / 1 dose for all adults aged 64 years and older. Screening  for abdominal aortic aneurysm (AAA)  by ultrasound is recommended for people who have history of high blood pressure or who are current or former smokers.

## 2016-01-23 ENCOUNTER — Other Ambulatory Visit: Payer: Self-pay | Admitting: Internal Medicine

## 2016-01-23 LAB — BASIC METABOLIC PANEL WITH GFR
BUN: 20 mg/dL (ref 7–25)
CALCIUM: 10 mg/dL (ref 8.6–10.4)
CO2: 27 mmol/L (ref 20–31)
Chloride: 97 mmol/L — ABNORMAL LOW (ref 98–110)
Creat: 0.87 mg/dL (ref 0.50–0.99)
GFR, EST NON AFRICAN AMERICAN: 69 mL/min (ref >=60)
GFR, Est African American: 80 mL/min (ref >=60)
Glucose, Bld: 123 mg/dL — ABNORMAL HIGH (ref 65–99)
Potassium: 4.4 mmol/L (ref 3.5–5.3)
SODIUM: 138 mmol/L (ref 135–146)

## 2016-01-23 LAB — IRON AND TIBC
%SAT: 34 % (ref 11–50)
Iron: 99 ug/dL (ref 45–160)
TIBC: 293 ug/dL (ref 250–450)
UIBC: 194 ug/dL (ref 125–400)

## 2016-01-23 LAB — URINALYSIS, ROUTINE W REFLEX MICROSCOPIC
Bilirubin Urine: NEGATIVE
Glucose, UA: NEGATIVE
HGB URINE DIPSTICK: NEGATIVE
Ketones, ur: NEGATIVE
LEUKOCYTES UA: NEGATIVE
Nitrite: NEGATIVE
Protein, ur: NEGATIVE
Specific Gravity, Urine: 1.016 (ref 1.001–1.035)
pH: 5.5 (ref 5.0–8.0)

## 2016-01-23 LAB — HEPATIC FUNCTION PANEL
ALT: 14 U/L (ref 6–29)
AST: 15 U/L (ref 10–35)
Albumin: 4.4 g/dL (ref 3.6–5.1)
Alkaline Phosphatase: 59 U/L (ref 33–130)
BILIRUBIN DIRECT: 0.1 mg/dL (ref <=0.2)
BILIRUBIN INDIRECT: 0.5 mg/dL (ref 0.2–1.2)
Total Bilirubin: 0.6 mg/dL (ref 0.2–1.2)
Total Protein: 6.8 g/dL (ref 6.1–8.1)

## 2016-01-23 LAB — LIPID PANEL
CHOLESTEROL: 255 mg/dL — AB (ref 125–200)
HDL: 153 mg/dL (ref >=46)
LDL Cholesterol: 80 mg/dL (ref <130)
TRIGLYCERIDES: 108 mg/dL (ref <150)
Total CHOL/HDL Ratio: 1.7 Ratio (ref <=5.0)
VLDL: 22 mg/dL (ref <30)

## 2016-01-23 LAB — MICROALBUMIN / CREATININE URINE RATIO
CREATININE, URINE: 96 mg/dL (ref 20–320)
MICROALB UR: 1 mg/dL
Microalb Creat Ratio: 10 mcg/mg creat (ref <30)

## 2016-01-23 LAB — MAGNESIUM: Magnesium: 1.8 mg/dL (ref 1.5–2.5)

## 2016-01-23 LAB — HEMOGLOBIN A1C
Hgb A1c MFr Bld: 5.8 % — ABNORMAL HIGH (ref <5.7)
MEAN PLASMA GLUCOSE: 120 mg/dL

## 2016-01-23 LAB — INSULIN, RANDOM: INSULIN: 13.7 u[IU]/mL (ref 2.0–19.6)

## 2016-01-23 LAB — VITAMIN D 25 HYDROXY (VIT D DEFICIENCY, FRACTURES): VIT D 25 HYDROXY: 67 ng/mL (ref 30–100)

## 2016-02-04 ENCOUNTER — Ambulatory Visit: Payer: BLUE CROSS/BLUE SHIELD

## 2016-02-04 ENCOUNTER — Ambulatory Visit
Admission: RE | Admit: 2016-02-04 | Discharge: 2016-02-04 | Disposition: A | Discharge status: Home / Self Care | Payer: BLUE CROSS/BLUE SHIELD | Source: Ambulatory Visit | Attending: Obstetrics and Gynecology | Admitting: Obstetrics and Gynecology

## 2016-02-04 DIAGNOSIS — Z780 Asymptomatic menopausal state: Secondary | ICD-10-CM

## 2016-02-04 DIAGNOSIS — Z1231 Encounter for screening mammogram for malignant neoplasm of breast: Secondary | ICD-10-CM

## 2016-02-11 ENCOUNTER — Encounter: Payer: Self-pay | Admitting: Internal Medicine

## 2016-02-12 ENCOUNTER — Other Ambulatory Visit: Payer: Self-pay | Admitting: Internal Medicine

## 2016-02-12 ENCOUNTER — Encounter: Payer: Self-pay | Admitting: Internal Medicine

## 2016-02-12 MED ORDER — BUPROPION HCL ER (XL) 150 MG PO TB24: 150.0000 mg | ORAL_TABLET | ORAL | Status: DC

## 2016-02-13 ENCOUNTER — Other Ambulatory Visit: Payer: Self-pay | Admitting: *Deleted

## 2016-02-13 MED ORDER — BUPROPION HCL ER (XL) 150 MG PO TB24: 150.0000 mg | ORAL_TABLET | ORAL | Status: DC

## 2016-02-24 ENCOUNTER — Encounter: Payer: Self-pay | Admitting: Internal Medicine

## 2016-03-04 ENCOUNTER — Encounter: Payer: Self-pay | Admitting: Internal Medicine

## 2016-03-04 ENCOUNTER — Other Ambulatory Visit: Payer: Self-pay | Admitting: Internal Medicine

## 2016-03-05 ENCOUNTER — Encounter: Payer: Self-pay | Admitting: Internal Medicine

## 2016-03-11 ENCOUNTER — Other Ambulatory Visit: Payer: Self-pay | Admitting: *Deleted

## 2016-03-11 ENCOUNTER — Encounter: Payer: Self-pay | Admitting: Internal Medicine

## 2016-03-11 DIAGNOSIS — Z0001 Encounter for general adult medical examination with abnormal findings: Secondary | ICD-10-CM

## 2016-03-11 DIAGNOSIS — Z1212 Encounter for screening for malignant neoplasm of rectum: Secondary | ICD-10-CM

## 2016-03-11 LAB — POC HEMOCCULT BLD/STL (HOME/3-CARD/SCREEN)
Card #2 Fecal Occult Blod, POC: NEGATIVE
FECAL OCCULT BLD: NEGATIVE
Fecal Occult Blood, POC: NEGATIVE

## 2016-03-12 ENCOUNTER — Encounter: Payer: Self-pay | Admitting: Sports Medicine

## 2016-03-12 ENCOUNTER — Ambulatory Visit (INDEPENDENT_AMBULATORY_CARE_PROVIDER_SITE_OTHER): Payer: BLUE CROSS/BLUE SHIELD | Admitting: Sports Medicine

## 2016-03-12 DIAGNOSIS — R269 Unspecified abnormalities of gait and mobility: Secondary | ICD-10-CM | POA: Diagnosis not present

## 2016-03-12 NOTE — Assessment & Plan Note (Signed)
She had congenital cavus foot with marked supination  I think the position was improved after surgery  Now with ankle brace she is functional and has less pain  Encourage cont use of ankle brace Cont moderation in exercise  She can reconsider ankle surgery if this worsens

## 2016-03-12 NOTE — Progress Notes (Signed)
Subjective:    Patient ID: Carmen Reynolds , female   DOB: 1950-11-10 , 65 y.o..   MRN: [id number]  HPI  Carmen Reynolds is here for left ankle/foot evaluation.  Patient states that in August 2014 she ruptured a tendon in her left ankle (peroneal tendon rupture). Dr. Doran Durand performed surgery to attach a cadaver part and he added hardware after lowering her arch. When the cast was put on, they put it on in a way that supinated her foot. After the cast was off she was forced her to walk on the lateral side of her left foot because of how the foot healed.   In January 2017 she was walking on her treadmill and she heard a "pop". XRAY showed that she had a closed nondisplaced stress fracture on the 5th metatarsal. She had a cast placed again. She continued to have pain even after the cast was taken off because she continued to walk on the lateral side of her foot. Patient claims Dr. Doran Durand suggested surgery to "lock" her ankle so she no longer walks on the lateral side of her foot.   Today patient is using a [place] brace that helps her. She states she typically has no pain in her left foot or ankle. She has no difficulty with daily activities and walks 2-3 miles daily. The only time she endorses pain is when she "over does it" and walks more than her typical distance. . Denies any weakness, numbness, tingling.  Review of Systems: Per HPI. All other systems reviewed and are negative.  Past Medical History: Patient Active Problem List   Diagnosis Date Noted  . CKD2 (GFR 77 ml/min)  01/04/2015  . GERD  01/04/2015  . Factor 5 Leiden mutation, heterozygous ([place]) 05/23/2014  . Chronic cholecystitis 10/23/2013  . Hyperlipidemia 10/10/2013  . Prediabetes 10/10/2013  . Medication management 10/10/2013  . Vitamin D deficiency 10/10/2013  . Abnormality of gait 03/08/2013  . Hallux rigidus of right foot 02/07/2013  . Essential hypertension 2020-08-1006  . Ulcerative colitis ([place]) 2020-08-1006  . DEGENERATIVE  DISC DISEASE 2020-08-1006  . LAMINECTOMY, LUMBAR, HX OF 2020-08-1006    Medications: reviewed and updated Current Outpatient Prescriptions  Medication Sig Dispense Refill  . buPROPion (WELLBUTRIN XL) 150 MG 24 hr tablet Take 1 tablet (150 mg total) by mouth every morning. 90 tablet 2  . Calcium Carbonate-Vit D-Min (CALCIUM 1200 PO) Take 2,400 mg by mouth daily.    . cholecalciferol (VITAMIN D) 1000 UNITS tablet Take 3,000 Units by mouth daily.     . Coenzyme Q10 (CO Q 10) 100 MG CAPS Take 200 mg by mouth daily. Quol Ultra CoQ10 179m    . DULoxetine (CYMBALTA) 60 MG capsule TAKE 1 CAPSULE DAILY FOR   MOOD AND PAIN 90 capsule 1  . esomeprazole (NEXIUM) 40 MG capsule TAKE 1 CAPSULE DAILY 90 capsule 1  . ESTRACE VAGINAL 0.1 MG/GM vaginal cream     . Estradiol Acetate ([facility]) Place 1 Dose vaginally. Patient changes the ring every 3 months.    . fexofenadine (ALLEGRA) 180 MG tablet Take 180 mg by mouth as needed for allergies.     . fluticasone (FLONASE) 50 MCG/ACT nasal spray Place 2 sprays into both nostrils daily. 16 g 0  . loperamide (IMODIUM) 2 MG capsule Take 2 mg by mouth as needed for diarrhea or loose stools.    . mesalamine (CANASA) 1000 MG suppository Place 1 suppository (1,000 mg total) rectally at bedtime. 90 suppository  3  . Methylcellulose, Laxative, (CITRUCEL) 500 MG TABS Take 1,000 mg by mouth daily.     [name] MYRBETRIQ 50 MG TB24 tablet TAKE 1 TABLET DAILY 90 tablet 1  . olmesartan (BENICAR) 40 MG tablet TAKE 1 TABLET DAILY FOR    BLOOD PRESSURE 90 tablet 3  . OVER THE COUNTER MEDICATION Take 300 mg by mouth daily. Mega red 356m    . predniSONE (DELTASONE) 10 MG tablet     . Red Yeast Rice Extract 600 MG TABS Take 1,200 mg by mouth 2 (two) times daily.    . verapamil (VERELAN PM) 240 MG 24 hr capsule Take 1 capsule (240 mg total) by mouth daily. 90 capsule PRN   No current facility-administered medications for this visit.     Social Hx:  reports that she quit smoking about  25 years ago. Her smoking use included Cigarettes. She has a 24.00 pack-year smoking history. She does not have any smokeless tobacco history on file.    Objective:   BP (!) 152/83   Pulse 96   Ht 5' 4"  (1.626 m)   Wt 152 lb (68.9 kg)   BMI 26.09 kg/m  Physical Exam  Gen: NAD, alert, cooperative with exam, well-appearing MSK:  Left Ankle & Foot: No visible swelling, ecchymosis, erythema, ulcers, calluses, blister. Positive hyperpigmented digits. Foot supinated. Lateral malleolus is prominent. Arch:pes cavus  Toes:  claw  deformity toes 2 to 5 - more laterally No pain at MT heads No pain at base of 5th MT; No tenderness over cuboid; No tenderness over N spot or navicular prominence No tenderness on lateral and medial malleolus Full in plantarflexion, dorsiflexion, inversion, and eversion of the foot; flexion and extension of the toes Strength: 5/5 in all directions. Sensation: intact Vascular: intact w/ dorsalis pedis & posterior tibialis pulses 2+ Stable lateral and medial ligaments; Negative Anterior drawer tests   Right Foot/ankle exam:  Inspection: No swelling, ecchymosis or deformities. Normal medial arch.  Palpation: Non-tender to palpation throughout Range of Motion: FROM all ankle/foot joints Strength: 5/5 dorsiflexion and plantarflexion Neurovascularly intact   Assessment & Plan:   Supination of left foot: Complex foot and ankle history but supination per patient worsened secondary to how the foot healed after a tendon repair surgery in 2014. Discussed pros and cons of having left ankle fusion surgery. I wouldnote that when I evaluated her in 02/2013 prior to surgery she had marked supination and even ankle subluxation.  At this time we are not encouraging surgery as the pain level is acceptable and patient is functional in the ankle despite supination. Patient appears that she is leaning more towards conservative measures and not pursuing surgery at this time.  -  Continue using [place] ankle brace, especially with ambulation - Continue donut on lateral malleolus - Cushioned insole was placed in shoe at the office - Continue daily walking but limit to no more than 10 miles/week which is where patient feels most comfortable - Follow up as needed  CSmitty Cords [place] PGY-2  Edited and supervised.  KIla Mcgill MD

## 2016-03-13 ENCOUNTER — Encounter: Payer: Self-pay | Admitting: Internal Medicine

## 2016-04-08 ENCOUNTER — Encounter: Payer: Self-pay | Admitting: Internal Medicine

## 2016-04-15 ENCOUNTER — Other Ambulatory Visit: Payer: Self-pay | Admitting: Internal Medicine

## 2016-04-16 ENCOUNTER — Other Ambulatory Visit: Payer: Self-pay | Admitting: Internal Medicine

## 2016-05-17 ENCOUNTER — Encounter: Payer: Self-pay | Admitting: Internal Medicine

## 2016-06-10 ENCOUNTER — Other Ambulatory Visit: Payer: Self-pay | Admitting: *Deleted

## 2016-06-10 ENCOUNTER — Other Ambulatory Visit: Payer: Self-pay | Admitting: Internal Medicine

## 2016-06-10 MED ORDER — VERAPAMIL HCL ER 240 MG PO CP24: 240.0000 mg | ORAL_CAPSULE | Freq: Every day | ORAL | 0 refills | Status: DC

## 2016-06-11 ENCOUNTER — Encounter: Payer: Self-pay | Admitting: Internal Medicine

## 2016-06-11 ENCOUNTER — Other Ambulatory Visit: Payer: Self-pay | Admitting: Internal Medicine

## 2016-06-11 DIAGNOSIS — I10 Essential (primary) hypertension: Secondary | ICD-10-CM

## 2016-06-11 MED ORDER — VERAPAMIL HCL ER 240 MG PO CP24: ORAL_CAPSULE | ORAL | 3 refills | Status: DC

## 2016-06-15 ENCOUNTER — Other Ambulatory Visit: Payer: Self-pay | Admitting: *Deleted

## 2016-06-15 DIAGNOSIS — I10 Essential (primary) hypertension: Secondary | ICD-10-CM

## 2016-06-15 MED ORDER — VERAPAMIL HCL ER 240 MG PO CP24: ORAL_CAPSULE | ORAL | 3 refills | Status: DC

## 2016-07-09 ENCOUNTER — Other Ambulatory Visit: Payer: Self-pay | Admitting: Internal Medicine

## 2016-07-20 ENCOUNTER — Other Ambulatory Visit: Payer: Self-pay | Admitting: Internal Medicine

## 2016-08-05 ENCOUNTER — Encounter: Payer: Self-pay | Admitting: Internal Medicine

## 2016-08-05 ENCOUNTER — Ambulatory Visit (INDEPENDENT_AMBULATORY_CARE_PROVIDER_SITE_OTHER): Payer: BLUE CROSS/BLUE SHIELD | Admitting: Internal Medicine

## 2016-08-05 VITALS — BP 124/76 | HR 100 | Temp 97.2°F | Resp 16 | Ht 64.0 in | Wt 142.4 lb

## 2016-08-05 DIAGNOSIS — E782 Mixed hyperlipidemia: Secondary | ICD-10-CM

## 2016-08-05 DIAGNOSIS — R7303 Prediabetes: Secondary | ICD-10-CM

## 2016-08-05 DIAGNOSIS — E559 Vitamin D deficiency, unspecified: Secondary | ICD-10-CM | POA: Diagnosis not present

## 2016-08-05 DIAGNOSIS — I10 Essential (primary) hypertension: Secondary | ICD-10-CM | POA: Diagnosis not present

## 2016-08-05 DIAGNOSIS — N3281 Overactive bladder: Secondary | ICD-10-CM

## 2016-08-05 DIAGNOSIS — K219 Gastro-esophageal reflux disease without esophagitis: Secondary | ICD-10-CM

## 2016-08-05 DIAGNOSIS — Z79899 Other long term (current) drug therapy: Secondary | ICD-10-CM

## 2016-08-05 LAB — CBC WITH DIFFERENTIAL/PLATELET
BASOS ABS: 0 {cells}/uL (ref 0–200)
BASOS PCT: 0 %
EOS PCT: 0 %
Eosinophils Absolute: 0 cells/uL — ABNORMAL LOW (ref 15–500)
HCT: 38.3 % (ref 35.0–45.0)
HEMOGLOBIN: 12.5 g/dL (ref 11.7–15.5)
LYMPHS ABS: 804 {cells}/uL — AB (ref 850–3900)
Lymphocytes Relative: 4 %
MCH: 31.4 pg (ref 27.0–33.0)
MCHC: 32.6 g/dL (ref 32.0–36.0)
MCV: 96.2 fL (ref 80.0–100.0)
MPV: 8.6 fL (ref 7.5–12.5)
Monocytes Absolute: 603 cells/uL (ref 200–950)
Monocytes Relative: 3 %
NEUTROS ABS: 18693 {cells}/uL — AB (ref 1500–7800)
Neutrophils Relative %: 93 %
Platelets: 331 10*3/uL (ref 140–400)
RBC: 3.98 MIL/uL (ref 3.80–5.10)
RDW: 13.7 % (ref 11.0–15.0)
WBC: 20.1 10*3/uL — ABNORMAL HIGH (ref 3.8–10.8)

## 2016-08-05 LAB — TSH: TSH: 1.23 m[IU]/L

## 2016-08-05 MED ORDER — SOLIFENACIN SUCCINATE 10 MG PO TABS: ORAL_TABLET | ORAL | 0 refills | Status: DC

## 2016-08-05 NOTE — Patient Instructions (Signed)

## 2016-08-05 NOTE — Progress Notes (Signed)
[facility] Unk Pinto, M.D.        Uvaldo Bristle. Silverio Lay, P.A.-C       Starlyn Skeans, P.A.-C  [facility]                [address] [place], [place] [address] Telephone [phone] Telefax [phone] ______________________________________________________________________     This very nice 66 y.o. Separated WF presents for 6 month follow up with Hypertension, Hyperlipidemia, Pre-Diabetes and Vitamin D Deficiency. Patient is somewhat distraught today relating that she is alone as her husband has left and is divorcing her and she seeing Thayer Headings at [place] services. Apparently she stopped Wellbutrin due to SE's and never took the Abilify that was prescribed to her for concern of possible side-effects. She is taking Buspar now. 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      Patient is treated for HTN (1997) & BP has been controlled at home. Today's BP is at goal - 124/76. Patient has had no complaints of any cardiac type chest pain, palpitations, dyspnea/orthopnea/PND, dizziness, claudication, or dependent edema.     Hyperlipidemia is controlled with diet & meds. Patient denies myalgias or other med SE's. Last Lipids were at goal with very elevated HDL 153 and low LDL 80: Lab Results  Component Value Date   CHOL 255 (H) 01/22/2016   HDL 153 01/22/2016   LDLCALC 80 01/22/2016   TRIG 108 01/22/2016   CHOLHDL 1.7 01/22/2016      Also, the patient has history of PreDiabetes circa 2013 with A1c 5.7%in  and has had no symptoms of reactive hypoglycemia, diabetic polys, paresthesias or visual blurring.  Last A1c was not at goal: Lab Results  Component Value Date   HGBA1C 5.8 (H) 01/22/2016       Further, the patient also has history of Vitamin D Deficiency  In 2008 of "28" and supplements vitamin D without any suspected side-effects. Last vitamin D was at goal: Lab Results  Component Value Date   VD25OH 67 01/22/2016   Current Outpatient Prescriptions on File Prior to Visit  Medication Sig  . Calcium Carbonate-Vit D-Min (CALCIUM 1200 PO) Take 2,400 mg by mouth daily.  . cholecalciferol (VITAMIN D) 1000 UNITS tablet Take 3,000 Units by mouth daily.   . Coenzyme Q10 (CO Q 10) 100 MG CAPS Take 200 mg by mouth daily. Quol Ultra CoQ10 179m  . DULoxetine (CYMBALTA) 60 MG capsule TAKE 1 CAPSULE DAILY FOR   MOOD AND PAIN  . esomeprazole (NEXIUM) 40 MG capsule TAKE 1 CAPSULE DAILY  . fexofenadine (ALLEGRA) 180 MG tablet Take 180 mg by mouth as needed for allergies.   . fluticasone (FLONASE) 50 MCG/ACT nasal spray Place 2 sprays into both nostrils daily.  .Marland Kitchenloperamide (IMODIUM) 2 MG capsule Take 2 mg by mouth as needed for diarrhea or loose stools.  . mesalamine (CANASA) 1000 MG suppository Place 1 suppository (1,000 mg total) rectally at bedtime.  . Methylcellulose, Laxative, (CITRUCEL) 500 MG TABS Take 1,000 mg by mouth daily.   .Marland Kitchenolmesartan (BENICAR) 40 MG tablet TAKE 1 TABLET DAILY FOR    BLOOD PRESSURE  . OVER THE COUNTER MEDICATION Take 300 mg by mouth  daily. Mega red 389m  . Red Yeast Rice Extract 600 MG TABS Take 1,200 mg by mouth 2 (two) times daily.  . verapamil (VERELAN PM) 240 MG 24 hr capsule Take 1 capsule daily with food   No current facility-administered medications on file prior to visit.    Allergies  Allergen Reactions  . Augmentin [Amoxicillin-Pot Clavulanate] Diarrhea  . Lyrica [Pregabalin] Swelling    Swelling of feet.  . Meloxicam Other (See Comments)    Intestinal Bleed  . Other Other (See Comments)    Salt-sensitivity Grain-sensitivity Artificial sweetners-sensitivity Sugar-sensitivity  . Wheat Bran Other (See Comments)    Wheat-sensitivity  .  Lactose Intolerance (Gi) Other (See Comments)    unknown  . Quinapril Other (See Comments)    unknown  . Vasotec [Enalaprilat] Cough   PMHx:   Past Medical History:  Diagnosis Date  . Adult celiac disease   . Concussion    fell jumping off of horse in past 6 months- 09/14/12  . DDD (degenerative disc disease)   . Diverticulitis   . DJD (degenerative joint disease)   . Factor V Leiden ([place]   . Fibromyalgia   . Fusion of spine of lumbar region t  . GERD (gastroesophageal reflux disease)   . History of fusion of cervical spine t  . Hyperlipidemia   . Hypertension   . Lactose intolerance   . Proctitis   . Scoliosis   . Spinal stenosis   . Ulcerative colitis ([place]   . Vitamin D deficiency    Immunization History  Administered Date(s) Administered  . Influenza-Unspecified 04/26/2014  . Pneumococcal Polysaccharide-23 09/26/2012  . Td 01/22/2016   Past Surgical History:  Procedure Laterality Date  . ANTERIOR LATERAL LUMBAR FUSION 4 LEVELS N/A 09/22/2012   Procedure: ANTERIOR LATERAL LUMBAR FUSION 4 LEVELS;  Surgeon: JErline Levine MD;  [facility];  Service: Neurosurgery;  Laterality: N/A;  Thoracic Twelve Lumbar One,Lumbar One-TwoAnterolateral decompression/fusion/percutaneous percutaneous pedicle screws   . BACK SURGERY  2006, 2008   . disectomy  . CHEST TUBE INSERTION Left 09/22/2012   Procedure: CHEST TUBE INSERTION;  Surgeon: JErline Levine MD;  [facility];  Service: Neurosurgery;  Laterality: Left;  Insertion per Dr. BCyndia Bent . CHOLECYSTECTOMY N/A 11/22/2013   Procedure: LAPAROSCOPIC CHOLECYSTECTOMY ;  Surgeon: DHarl Bowie MD;  [facility]  Service: General;  Laterality: N/A;  . EYE SURGERY    . FOOT SURGERY Right 1978   Reconstruction after faliure of emergency stitching  . LASIK    . NECK SURGERY  2002   anterior cervical fusion  . TUBAL LIGATION  1983   FHx:    Reviewed / unchanged  SHx:    Reviewed / unchanged  Systems  Review:  Constitutional: Denies fever, chills, wt changes, headaches, insomnia, fatigue, night sweats, change in appetite. Eyes: Denies redness, blurred vision, diplopia, discharge, itchy, watery eyes.  ENT: Denies discharge, congestion, post nasal drip, epistaxis, sore throat, earache, hearing loss, dental pain, tinnitus, vertigo, sinus pain, snoring.  CV: Denies chest pain, palpitations, irregular heartbeat, syncope, dyspnea, diaphoresis, orthopnea, PND, claudication or edema. Respiratory: denies cough, dyspnea, DOE, pleurisy, hoarseness, laryngitis, wheezing.  Gastrointestinal: Denies dysphagia, odynophagia, heartburn, reflux, water brash, abdominal pain or cramps, nausea, vomiting, bloating, diarrhea, constipation, hematemesis, melena, hematochezia  or hemorrhoids. Genitourinary: Denies dysuria,nocturia, hesitancy, discharge, hematuria or flank pain. (+) issues with frequency and urgency and is intolerant to Myrbetriq due to nasal sx's/shinitis ands desires to try a different agent for  her OAB.  Musculoskeletal: Denies arthralgias, myalgias, stiffness, jt. swelling, pain, limping or strain/sprain.  Skin: Denies pruritus, rash, hives, warts, acne, eczema or change in skin lesion(s). Neuro: No weakness, tremor, incoordination, spasms, paresthesia or pain. Psychiatric: Denies confusion, memory loss or sensory loss. Endo: Denies change in weight, skin or hair change.  Heme/Lymph: No excessive bleeding, bruising or enlarged lymph nodes.  Physical Exam  BP 124/76   Pulse 100   Temp 97.2 F (36.2 C)   Resp 16   Ht 5' 4"  (1.626 m)   Wt 142 lb 6.4 oz (64.6 kg)   BMI 24.44 kg/m   Appears well nourished and in no distress.Mild Cushingoid facies.  Eyes: PERRLA, EOMs, conjunctiva no swelling or erythema. Sinuses: No frontal/maxillary tenderness ENT/Mouth: EAC's clear, TM's nl w/o erythema, bulging. Nares clear w/o erythema, swelling, exudates. Oropharynx clear without erythema or exudates. Oral  hygiene is good. Tongue normal, non obstructing. Hearing intact.  Neck: Supple. Thyroid nl. Car 2+/2+ without bruits, nodes or JVD. Chest: Respirations nl with BS clear & equal w/o rales, rhonchi, wheezing or stridor.  Cor: Heart sounds normal w/ regular rate and rhythm without sig. murmurs, gallops, clicks, or rubs. Peripheral pulses normal and equal  without edema.  Abdomen: Soft & bowel sounds normal. Non-tender w/o guarding, rebound, hernias, masses, or organomegaly.  Lymphatics: Unremarkable.  Musculoskeletal: Full ROM all peripheral extremities, joint stability, 5/5 strength, and normal gait.  Skin: Warm, dry without exposed rashes, lesions or ecchymosis apparent.  Neuro: Cranial nerves intact, reflexes equal bilaterally. Sensory-motor testing grossly intact. Tendon reflexes grossly intact.  Pysch: Alert & oriented x 3.  Insight and judgement nl & appropriate. No ideations.  Assessment and Plan:  1. Essential hypertension  - Continue medication, monitor blood pressure at home.  - Continue DASH diet. Reminder to go to the ER if any CP,  SOB, nausea, dizziness, severe HA, changes vision/speech,  left arm numbness and tingling and jaw pain. - CBC with Differential/Platelet - BASIC METABOLIC PANEL WITH GFR - TSH  2. Hyperlipidemia  - Continue diet/meds, exercise,& lifestyle modifications.  - Continue monitor periodic cholesterol/liver & renal functions  - Hepatic function panel - Lipid panel - TSH  3. Prediabetes  - Continue diet, exercise, lifestyle modifications.  - Monitor appropriate labs. - Hemoglobin A1c - Insulin, random  4. Vitamin D deficiency  - Continue supplementation. - VITAMIN D 25 Hydroxy   5. Gastroesophageal reflux disease   6. Medication management  - CBC with Differential/Platelet - BASIC METABOLIC PANEL WITH GFR - Hepatic function panel - Magnesium  7. OAB (overactive bladder)  - solifenacin (VESICARE) 10 MG tablet; Take 1/2 to 1 tablet  daily for Bladder  Dispense: 30 tablet; Refill: 0       Recommended regular exercise, BP monitoring, weight control, and discussed med and SE's. Recommended labs to assess and monitor clinical status. Further disposition pending results of labs. Over 30 minutes of exam, counseling, chart review was performed

## 2016-08-06 LAB — HEPATIC FUNCTION PANEL
ALK PHOS: 68 U/L (ref 33–130)
ALT: 23 U/L (ref 6–29)
AST: 20 U/L (ref 10–35)
Albumin: 4.3 g/dL (ref 3.6–5.1)
BILIRUBIN INDIRECT: 0.6 mg/dL (ref 0.2–1.2)
Bilirubin, Direct: 0.1 mg/dL (ref <=0.2)
Total Bilirubin: 0.7 mg/dL (ref 0.2–1.2)
Total Protein: 6.7 g/dL (ref 6.1–8.1)

## 2016-08-06 LAB — INSULIN, RANDOM: Insulin: 6.9 u[IU]/mL (ref 2.0–19.6)

## 2016-08-06 LAB — BASIC METABOLIC PANEL WITH GFR
BUN: 18 mg/dL (ref 7–25)
CHLORIDE: 98 mmol/L (ref 98–110)
CO2: 22 mmol/L (ref 20–31)
Calcium: 9.5 mg/dL (ref 8.6–10.4)
Creat: 1.03 mg/dL — ABNORMAL HIGH (ref 0.50–0.99)
GFR, EST NON AFRICAN AMERICAN: 56 mL/min — AB (ref >=60)
GFR, Est African American: 65 mL/min (ref >=60)
Glucose, Bld: 121 mg/dL — ABNORMAL HIGH (ref 65–99)
POTASSIUM: 4.1 mmol/L (ref 3.5–5.3)
SODIUM: 139 mmol/L (ref 135–146)

## 2016-08-06 LAB — LIPID PANEL
CHOL/HDL RATIO: 1.9 ratio (ref <5.0)
Cholesterol: 267 mg/dL — ABNORMAL HIGH (ref <200)
HDL: 141 mg/dL (ref >50)
LDL Cholesterol: 92 mg/dL (ref <100)
Triglycerides: 171 mg/dL — ABNORMAL HIGH (ref <150)
VLDL: 34 mg/dL — AB (ref <30)

## 2016-08-06 LAB — HEMOGLOBIN A1C
HEMOGLOBIN A1C: 5.2 % (ref <5.7)
Mean Plasma Glucose: 103 mg/dL

## 2016-08-06 LAB — VITAMIN D 25 HYDROXY (VIT D DEFICIENCY, FRACTURES): Vit D, 25-Hydroxy: 57 ng/mL (ref 30–100)

## 2016-08-06 LAB — MAGNESIUM: Magnesium: 1.6 mg/dL (ref 1.5–2.5)

## 2016-08-11 ENCOUNTER — Other Ambulatory Visit: Payer: Self-pay | Admitting: Internal Medicine

## 2016-08-11 ENCOUNTER — Encounter: Payer: Self-pay | Admitting: Internal Medicine

## 2016-08-11 MED ORDER — AZITHROMYCIN 250 MG PO TABS: ORAL_TABLET | ORAL | 0 refills | Status: DC

## 2016-09-02 ENCOUNTER — Other Ambulatory Visit: Payer: Self-pay | Admitting: Internal Medicine

## 2016-09-02 DIAGNOSIS — N3281 Overactive bladder: Secondary | ICD-10-CM

## 2016-09-02 MED ORDER — SOLIFENACIN SUCCINATE 10 MG PO TABS: ORAL_TABLET | ORAL | 1 refills | Status: DC

## 2016-10-18 ENCOUNTER — Other Ambulatory Visit: Payer: Self-pay | Admitting: Internal Medicine

## 2016-10-19 ENCOUNTER — Other Ambulatory Visit: Payer: Self-pay | Admitting: *Deleted

## 2016-10-19 DIAGNOSIS — N3281 Overactive bladder: Secondary | ICD-10-CM

## 2016-10-19 MED ORDER — SOLIFENACIN SUCCINATE 10 MG PO TABS: ORAL_TABLET | ORAL | 1 refills | Status: DC

## 2016-10-22 ENCOUNTER — Other Ambulatory Visit: Payer: Self-pay | Admitting: *Deleted

## 2016-10-22 ENCOUNTER — Other Ambulatory Visit: Payer: Self-pay | Admitting: Internal Medicine

## 2016-10-22 DIAGNOSIS — N3281 Overactive bladder: Secondary | ICD-10-CM

## 2016-10-22 MED ORDER — SOLIFENACIN SUCCINATE 10 MG PO TABS: ORAL_TABLET | ORAL | 1 refills | Status: DC

## 2016-11-12 ENCOUNTER — Ambulatory Visit (INDEPENDENT_AMBULATORY_CARE_PROVIDER_SITE_OTHER): Payer: BLUE CROSS/BLUE SHIELD | Admitting: Physician Assistant

## 2016-11-12 ENCOUNTER — Encounter: Payer: Self-pay | Admitting: Physician Assistant

## 2016-11-12 ENCOUNTER — Ambulatory Visit: Payer: Self-pay | Admitting: Internal Medicine

## 2016-11-12 VITALS — BP 102/60 | Temp 97.4°F | Resp 16 | Ht 64.0 in | Wt 141.0 lb

## 2016-11-12 DIAGNOSIS — R531 Weakness: Secondary | ICD-10-CM | POA: Diagnosis not present

## 2016-11-12 DIAGNOSIS — R7303 Prediabetes: Secondary | ICD-10-CM

## 2016-11-12 DIAGNOSIS — I10 Essential (primary) hypertension: Secondary | ICD-10-CM | POA: Diagnosis not present

## 2016-11-12 DIAGNOSIS — E782 Mixed hyperlipidemia: Secondary | ICD-10-CM

## 2016-11-12 DIAGNOSIS — Z79899 Other long term (current) drug therapy: Secondary | ICD-10-CM | POA: Diagnosis not present

## 2016-11-12 DIAGNOSIS — N182 Chronic kidney disease, stage 2 (mild): Secondary | ICD-10-CM

## 2016-11-12 DIAGNOSIS — K5100 Ulcerative (chronic) pancolitis without complications: Secondary | ICD-10-CM

## 2016-11-12 DIAGNOSIS — M542 Cervicalgia: Secondary | ICD-10-CM | POA: Diagnosis not present

## 2016-11-12 DIAGNOSIS — E559 Vitamin D deficiency, unspecified: Secondary | ICD-10-CM | POA: Diagnosis not present

## 2016-11-12 DIAGNOSIS — R5383 Other fatigue: Secondary | ICD-10-CM

## 2016-11-12 LAB — LIPID PANEL
CHOL/HDL RATIO: 1.9 ratio (ref <5.0)
Cholesterol: 243 mg/dL — ABNORMAL HIGH (ref <200)
HDL: 127 mg/dL (ref >50)
LDL Cholesterol: 78 mg/dL (ref <100)
TRIGLYCERIDES: 189 mg/dL — AB (ref <150)
VLDL: 38 mg/dL — AB (ref <30)

## 2016-11-12 LAB — CBC WITH DIFFERENTIAL/PLATELET
Basophils Absolute: 0 cells/uL (ref 0–200)
Basophils Relative: 0 %
EOS ABS: 0 {cells}/uL — AB (ref 15–500)
Eosinophils Relative: 0 %
HCT: 37.2 % (ref 35.0–45.0)
HEMOGLOBIN: 12.1 g/dL (ref 11.7–15.5)
LYMPHS PCT: 7 %
Lymphs Abs: 1379 cells/uL (ref 850–3900)
MCH: 30.6 pg (ref 27.0–33.0)
MCHC: 32.5 g/dL (ref 32.0–36.0)
MCV: 94.2 fL (ref 80.0–100.0)
MONO ABS: 788 {cells}/uL (ref 200–950)
MPV: 8.5 fL (ref 7.5–12.5)
Monocytes Relative: 4 %
NEUTROS PCT: 89 %
Neutro Abs: 17533 cells/uL — ABNORMAL HIGH (ref 1500–7800)
Platelets: 355 10*3/uL (ref 140–400)
RBC: 3.95 MIL/uL (ref 3.80–5.10)
RDW: 13.4 % (ref 11.0–15.0)
WBC: 19.7 10*3/uL — ABNORMAL HIGH (ref 3.8–10.8)

## 2016-11-12 LAB — BASIC METABOLIC PANEL WITH GFR
BUN: 16 mg/dL (ref 7–25)
CALCIUM: 10.1 mg/dL (ref 8.6–10.4)
CO2: 26 mmol/L (ref 20–31)
CREATININE: 1.16 mg/dL — AB (ref 0.50–0.99)
Chloride: 100 mmol/L (ref 98–110)
GFR, EST AFRICAN AMERICAN: 56 mL/min — AB (ref >=60)
GFR, Est Non African American: 49 mL/min — ABNORMAL LOW (ref >=60)
Glucose, Bld: 104 mg/dL — ABNORMAL HIGH (ref 65–99)
Potassium: 4.7 mmol/L (ref 3.5–5.3)
SODIUM: 139 mmol/L (ref 135–146)

## 2016-11-12 LAB — IRON AND TIBC
%SAT: 36 % (ref 11–50)
Iron: 97 ug/dL (ref 45–160)
TIBC: 270 ug/dL (ref 250–450)
UIBC: 173 ug/dL (ref 125–400)

## 2016-11-12 LAB — HEPATIC FUNCTION PANEL
ALT: 12 U/L (ref 6–29)
AST: 19 U/L (ref 10–35)
Albumin: 4.3 g/dL (ref 3.6–5.1)
Alkaline Phosphatase: 46 U/L (ref 33–130)
BILIRUBIN DIRECT: 0.1 mg/dL (ref <=0.2)
Indirect Bilirubin: 0.4 mg/dL (ref 0.2–1.2)
TOTAL PROTEIN: 6.8 g/dL (ref 6.1–8.1)
Total Bilirubin: 0.5 mg/dL (ref 0.2–1.2)

## 2016-11-12 NOTE — Progress Notes (Signed)
Assessment and Plan:  Hypertension:  -Continue medication -monitor blood pressure at home. -Continue DASH diet -Reminder to go to the ER if any CP, SOB, nausea, dizziness, severe HA, changes vision/speech, left arm numbness and tingling and jaw pain.  Cholesterol - Continue diet and exercise -Check cholesterol.   PreDiabetes without complications -Continue diet and exercise.  -Check A1C  Vitamin D Def -check level -continue medications.   Ulcerative pancolitis without complication (HCC) Follow up GI  CKD2 (GFR 77 ml/min)  -     BASIC METABOLIC PANEL WITH GFR  Fatigue, unspecified type and weakness - Discussed lifestyle modification as means of resolving problem, Training modifications discussed, See orders for lab evaluation, Discussed how depression can be a cause of fatigue if all labs are negative will discuss depression treatment or refer to neuro.  -     Urinalysis, Routine w reflex microscopic -     Iron and TIBC -     Vitamin B12 -     EKG 12-Lead -     Acetylcholine receptor, blocking  Neck pain/left shoulder pain, more mechanical but rule out MI -     EKG 12-Lead- normal EKG - follow up ortho - no accompaniments, if any changes, worsening, SOB, CP go to ER   Continue diet and meds as discussed. Further disposition pending results of labs. Discussed med's effects and SE's.    HPI 66 y.o. female  presents for 3 month follow up with hypertension, hyperlipidemia, diabetes and vitamin D deficiency.   Her blood pressure has been controlled at home, today their BP is BP: 102/60.She does workout. She denies chest pain, shortness of breath, dizziness.   She is on cholesterol medication and denies myalgias. Her cholesterol is at goal. The cholesterol was:  08/05/2016: Cholesterol 267; HDL 141; LDL Cholesterol 92; Triglycerides 171   She has been working on diet and exercise for prediabetes without complications, she is on bASA, she is on ACE/ARB, and denies  foot  ulcerations, hyperglycemia, hypoglycemia , increased appetite, nausea, paresthesia of the feet, polydipsia, polyuria, visual disturbances, vomiting and weight loss. Last A1C was: 08/05/2016: Hgb A1c MFr Bld 5.2   Patient is on Vitamin D supplement. 08/05/2016: Vit D, 25-Hydroxy 35   Going through divorce x July, states handling okay just very stressful.  She has had some weakness/fatigue with doing activities, has to sit down, no double vision, some imbalance, some bilateral tremor more intention, numbness tingling left shoulder/arm into her neck intermittnet. No SOB, no CP. Brain MRI 2013/10/04. Brother and ex husband died in October 04, 2022.   She has UC, follows with GI, has had flair recently, will take 3-4 prednisone during.   BMI is Body mass index is 24.2 kg/m., she is working on diet and exercise. Wt Readings from Last 3 Encounters:  11/12/16 141 lb (64 kg)  08/05/16 142 lb 6.4 oz (64.6 kg)  03/12/16 152 lb (68.9 kg)    Current Medications:  Current Outpatient Prescriptions on File Prior to Visit  Medication Sig Dispense Refill  . ARIPiprazole (ABILIFY) 2 MG tablet Take 2 mg by mouth daily.  0  . busPIRone (BUSPAR) 30 MG tablet TAKE 2/3 TABLET BY MOUTH 2 TIMES A DAY FOR 1 WEEK THEN 1 TABLET 2 TIMES DAILY  0  . Calcium Carbonate-Vit D-Min (CALCIUM 1200 PO) Take 2,400 mg by mouth daily.    . cholecalciferol (VITAMIN D) 1000 UNITS tablet Take 3,000 Units by mouth daily.     . Coenzyme Q10 (CO Q 10)  100 MG CAPS Take 200 mg by mouth daily. Quol Ultra CoQ10 178m    . DULoxetine (CYMBALTA) 60 MG capsule TAKE 1 CAPSULE DAILY FOR   MOOD AND PAIN 90 capsule 1  . esomeprazole (NEXIUM) 40 MG capsule TAKE 1 CAPSULE DAILY 90 capsule 1  . estradiol (ESTRACE) 0.1 MG/GM vaginal cream Place 1 Applicatorful vaginally at bedtime.    . fexofenadine (ALLEGRA) 180 MG tablet Take 180 mg by mouth as needed for allergies.     . fluticasone (FLONASE) 50 MCG/ACT nasal spray Place 2 sprays into both nostrils daily. 16 g 0  .  loperamide (IMODIUM) 2 MG capsule Take 2 mg by mouth as needed for diarrhea or loose stools.    . mesalamine (CANASA) 1000 MG suppository Place 1 suppository (1,000 mg total) rectally at bedtime. 90 suppository 3  . Methylcellulose, Laxative, (CITRUCEL) 500 MG TABS Take 1,000 mg by mouth daily.     .Marland Kitchenolmesartan (BENICAR) 40 MG tablet TAKE 1 TABLET DAILY FOR    BLOOD PRESSURE 90 tablet 3  . OVER THE COUNTER MEDICATION Take 300 mg by mouth daily. Mega red 3070m   . predniSONE (DELTASONE) 5 MG tablet TAKE 1 TABLET 3 TIMES A DAYOR AS DIRECTED 270 tablet 1  . Red Yeast Rice Extract 600 MG TABS Take 1,200 mg by mouth 2 (two) times daily.    . solifenacin (VESICARE) 10 MG tablet Take 1 10 mg tablet daily for Bladder 90 tablet 1  . traZODone (DESYREL) 100 MG tablet Take 100 mg by mouth at bedtime as needed.  0  . verapamil (VERELAN PM) 240 MG 24 hr capsule Take 1 capsule daily with food 90 capsule 3   No current facility-administered medications on file prior to visit.    Medical History:  Past Medical History:  Diagnosis Date  . Adult celiac disease   . Concussion    fell jumping off of horse in past 6 months- 09/14/12  . DDD (degenerative disc disease)   . Diverticulitis   . DJD (degenerative joint disease)   . Factor V Leiden ([place]  . Fibromyalgia   . Fusion of spine of lumbar region t  . GERD (gastroesophageal reflux disease)   . History of fusion of cervical spine t  . Hyperlipidemia   . Hypertension   . Lactose intolerance   . Proctitis   . Scoliosis   . Spinal stenosis   . Ulcerative colitis ([place]  . Vitamin D deficiency    Allergies:  Allergies  Allergen Reactions  . Augmentin [Amoxicillin-Pot Clavulanate] Diarrhea  . Lyrica [Pregabalin] Swelling    Swelling of feet.  . Meloxicam Other (See Comments)    Intestinal Bleed  . Other Other (See Comments)    Salt-sensitivity Grain-sensitivity Artificial sweetners-sensitivity Sugar-sensitivity  . Wheat Bran Other (See  Comments)    Wheat-sensitivity  . Lactose Intolerance (Gi) Other (See Comments)    unknown  . Quinapril Other (See Comments)    unknown  . Vasotec [Enalaprilat] Cough     Review of Systems:  Review of Systems  Constitutional: Negative for chills, fever and malaise/fatigue.  HENT: Negative.  Negative for congestion, ear pain and sore throat.   Eyes: Negative for discharge.  Respiratory: Positive for shortness of breath. Negative for cough, hemoptysis, sputum production and wheezing.   Cardiovascular: Negative for chest pain, palpitations, orthopnea, claudication, leg swelling and PND.  Gastrointestinal: Positive for blood in stool. Negative for constipation, diarrhea, heartburn and melena.  Genitourinary: Negative.  Musculoskeletal: Positive for myalgias and neck pain.  Skin: Negative.   Neurological: Positive for sensory change and focal weakness. Negative for dizziness, loss of consciousness and headaches.  Psychiatric/Behavioral: Positive for depression. The patient is not nervous/anxious and does not have insomnia.     Family history- Review and unchanged  Social history- Review and unchanged  Physical Exam: BP 102/60   Temp 97.4 F (36.3 C)   Resp 16   Ht 5' 4"  (1.626 m)   Wt 141 lb (64 kg)   BMI 24.20 kg/m  Wt Readings from Last 3 Encounters:  11/12/16 141 lb (64 kg)  08/05/16 142 lb 6.4 oz (64.6 kg)  03/12/16 152 lb (68.9 kg)   General Appearance: Well nourished well developed, non-toxic appearing, in no apparent distress. Eyes: PERRLA, EOMs, conjunctiva no swelling or erythema ENT/Mouth: Ear canals clear with no erythema, swelling, or discharge.  TMs normal bilaterally, oropharynx clear, moist, with no exudate.   Neck: Supple, thyroid normal, no JVD, no cervical adenopathy.  Respiratory: Respiratory effort normal, breath sounds clear A&P, no wheeze, rhonchi or rales noted.  No retractions, no accessory muscle usage Cardio: RRR with no MRGs. No noted edema.   Abdomen: Soft, + BS.  Non tender, no guarding, rebound, hernias, masses. Musculoskeletal: Full ROM, 5/5 strength, Normal gait Skin: Warm, dry without rashes, lesions, ecchymosis.  Neuro: Awake and oriented X 3, Cranial nerves intact. No cerebellar symptoms.  Psych: normal affect, Insight and Judgment appropriate.    Vicie Mutters, PA-C 3:11 PM [facility]

## 2016-11-12 NOTE — Patient Instructions (Addendum)
Cut benicar in half, monitor BP Call if above 140/90  Keep fluids up  Follow up GI   Please monitor your blood pressure. If it is getting below 130/80 AND you are having fatigue with exertion, dizziness we may need to cut your blood pressure medication in half. Please call the office if this is happening. Hypotension As your heart beats, it forces blood through your body. This force is called blood pressure. If you have hypotension, you have low blood pressure. When your blood pressure is too low, you may not get enough blood to your brain. You may feel weak, feel lightheaded, have a fast heartbeat, or even pass out (faint). HOME CARE  Drink enough fluids to keep your pee (urine) clear or pale yellow.  Take all medicines as told by your doctor.  Get up slowly after sitting or lying down.  Wear support stockings as told by your doctor.  Maintain a healthy diet by including foods such as fruits, vegetables, nuts, whole grains, and lean meats. GET HELP IF:  You are throwing up (vomiting) or have watery poop (diarrhea).  You have a fever for more than 2-3 days.  You feel more thirsty than usual.  You feel weak and tired. GET HELP RIGHT AWAY IF:   You pass out (faint).  You have chest pain or a fast or irregular heartbeat.  You lose feeling in part of your body.  You cannot move your arms or legs.  You have trouble speaking.  You get sweaty or feel lightheaded. MAKE SURE YOU:   Understand these instructions.  Will watch your condition.  Will get help right away if you are not doing well or get worse. Document Released: 10/07/2009 Document Revised: 03/15/2013 Document Reviewed: 01/13/2013 [facility] Patient Information 2015 [place], [place]. This information is not intended to replace advice given to you by your health care provider. Make sure you discuss any questions you have with your health care provider.  Your ears and sinuses are connected by the eustachian tube.  When your sinuses are inflamed, this can close off the tube and cause fluid to collect in your middle ear. This can then cause dizziness, popping, clicking, ringing, and echoing in your ears. This is often NOT an infection and does NOT require antibiotics, it is caused by inflammation so the treatments help the inflammation. This can take a long time to get better so please be patient.  Here are things you can do to help with this: - Try the Flonase or Nasonex. Remember to spray each nostril twice towards the outer part of your eye.  Do not sniff but instead pinch your nose and tilt your head back to help the medicine get into your sinuses.  The best time to do this is at bedtime.Stop if you get blurred vision or nose bleeds.  -While drinking fluids, pinch and hold nose close and swallow, to help open eustachian tubes to drain fluid behind ear drums. -Please pick one of the over the counter allergy medications below and take it once daily for allergies.  It will also help with fluid behind ear drums. Claritin or loratadine cheapest but likely the weakest  Zyrtec or certizine at night because it can make you sleepy The strongest is allegra or fexafinadine  Cheapest at [employer], [employer], [employer] -can use decongestant over the counter, please do not use if you have high blood pressure or certain heart conditions.   if worsening HA, changes vision/speech, imbalance, weakness go to the ER  What is the TMJ? The temporomandibular (tem-PUH-ro-man-DIB-yoo-ler) joint, or the TMJ, connects the upper and lower jawbones. This joint allows the jaw to open wide and move back and forth when you chew, talk, or yawn.There are also several muscles that help this joint move. There can be muscle tightness and pain in the muscle that can cause several symptoms.  What causes TMJ pain? There are many causes of TMJ pain. Repeated chewing (for example, chewing gum) and clenching your teeth can cause pain in the joint. Some TMJ  pain has no obvious cause. What can I do to ease the pain? There are many things you can do to help your pain get better. When you have pain:  Eat soft foods and stay away from chewy foods (for example, taffy) Try to use both sides of your mouth to chew Don't chew gum Massage Don't open your mouth wide (for example, during yawning or singing) Don't bite your cheeks or fingernails Lower your amount of stress and worry Applying a warm, damp washcloth to the joint may help. Over-the-counter pain medicines such as ibuprofen (one brand: Advil) or acetaminophen (one brand: Tylenol) might also help. Do not use these medicines if you are allergic to them or if your doctor told you not to use them. How can I stop the pain from coming back? When your pain is better, you can do these exercises to make your muscles stronger and to keep the pain from coming back:  Resisted mouth opening: Place your thumb or two fingers under your chin and open your mouth slowly, pushing up lightly on your chin with your thumb. Hold for three to six seconds. Close your mouth slowly. Resisted mouth closing: Place your thumbs under your chin and your two index fingers on the ridge between your mouth and the bottom of your chin. Push down lightly on your chin as you close your mouth. Tongue up: Slowly open and close your mouth while keeping the tongue touching the roof of the mouth. Side-to-side jaw movement: Place an object about one fourth of an inch thick (for example, two tongue depressors) between your front teeth. Slowly move your jaw from side to side. Increase the thickness of the object as the exercise becomes easier Forward jaw movement: Place an object about one fourth of an inch thick between your front teeth and move the bottom jaw forward so that the bottom teeth are in front of the top teeth. Increase the thickness of the object as the exercise becomes easier. These exercises should not be painful. If it hurts to do  these exercises, stop doing them and talk to your family doctor.    Cervical Radiculopathy Cervical radiculopathy means that a nerve in the neck is pinched or bruised. This can cause pain or loss of feeling (numbness) that runs from your neck to your arm and fingers. Follow these instructions at home: Managing pain   Take over-the-counter and prescription medicines only as told by your doctor.  If directed, put ice on the injured or painful area.  Put ice in a plastic bag.  Place a towel between your skin and the bag.  Leave the ice on for 20 minutes, 2-3 times per day.  If ice does not help, you can try using heat. Take a warm shower or warm bath, or use a heat pack as told by your doctor.  You may try a gentle neck and shoulder massage. Activity   Rest as needed. Follow instructions from your doctor  about any activities to avoid.  Do exercises as told by your doctor or physical therapist. General instructions   If you were given a soft collar, wear it as told by your doctor.  Use a flat pillow when you sleep.  Keep all follow-up visits as told by your doctor. This is important. Contact a doctor if:  Your condition does not improve with treatment. Get help right away if:  Your pain gets worse and is not controlled with medicine.  You lose feeling or feel weak in your hand, arm, face, or leg.  You have a fever.  You have a stiff neck.  You cannot control when you poop or pee (have incontinence).  You have trouble with walking, balance, or talking. This information is not intended to replace advice given to you by your health care provider. Make sure you discuss any questions you have with your health care provider. Document Released: 07/02/2011 Document Revised: 12/19/2015 Document Reviewed: 09/06/2014 Elsevier Interactive Patient Education  2017 [employer].

## 2016-11-13 LAB — URINALYSIS, ROUTINE W REFLEX MICROSCOPIC
BILIRUBIN URINE: NEGATIVE
GLUCOSE, UA: NEGATIVE
Hgb urine dipstick: NEGATIVE
LEUKOCYTES UA: NEGATIVE
Nitrite: NEGATIVE
PH: 5.5 (ref 5.0–8.0)
PROTEIN: NEGATIVE
SPECIFIC GRAVITY, URINE: 1.013 (ref 1.001–1.035)

## 2016-11-13 LAB — VITAMIN B12: VITAMIN B 12: 1098 pg/mL (ref 200–1100)

## 2016-11-13 LAB — TSH: TSH: 1.97 m[IU]/L

## 2016-11-13 LAB — MAGNESIUM: Magnesium: 1.8 mg/dL (ref 1.5–2.5)

## 2016-11-13 LAB — VITAMIN D 25 HYDROXY (VIT D DEFICIENCY, FRACTURES): VIT D 25 HYDROXY: 92 ng/mL (ref 30–100)

## 2016-11-15 LAB — ACETYLCHOLINE RECEPTOR, BLOCKING

## 2016-11-20 ENCOUNTER — Other Ambulatory Visit: Payer: Self-pay | Admitting: Physician Assistant

## 2016-11-20 ENCOUNTER — Encounter: Payer: Self-pay | Admitting: Physician Assistant

## 2016-11-20 MED ORDER — AZITHROMYCIN 250 MG PO TABS
ORAL_TABLET | ORAL | 1 refills | Status: AC
Start: 2016-11-20 — End: 2016-11-25

## 2016-11-20 MED ORDER — PREDNISONE 20 MG PO TABS: ORAL_TABLET | ORAL | 0 refills | Status: DC

## 2016-11-24 ENCOUNTER — Other Ambulatory Visit: Payer: Self-pay | Admitting: Obstetrics and Gynecology

## 2016-11-24 DIAGNOSIS — Z1231 Encounter for screening mammogram for malignant neoplasm of breast: Secondary | ICD-10-CM

## 2016-11-29 ENCOUNTER — Encounter: Payer: Self-pay | Admitting: Physician Assistant

## 2016-11-30 MED ORDER — PROMETHAZINE-DM 6.25-15 MG/5ML PO SYRP: 5.0000 mL | ORAL_SOLUTION | Freq: Four times a day (QID) | ORAL | 0 refills | Status: DC | PRN

## 2016-12-01 ENCOUNTER — Encounter: Payer: Self-pay | Admitting: Physician Assistant

## 2016-12-15 ENCOUNTER — Encounter: Payer: Self-pay | Admitting: Internal Medicine

## 2016-12-19 ENCOUNTER — Other Ambulatory Visit: Payer: Self-pay | Admitting: Internal Medicine

## 2017-01-17 ENCOUNTER — Other Ambulatory Visit: Payer: Self-pay | Admitting: Internal Medicine

## 2017-01-21 ENCOUNTER — Other Ambulatory Visit: Payer: Self-pay | Admitting: Neurosurgery

## 2017-02-04 ENCOUNTER — Ambulatory Visit
Admission: RE | Admit: 2017-02-04 | Discharge: 2017-02-04 | Disposition: A | Discharge status: Home / Self Care | Payer: BLUE CROSS/BLUE SHIELD | Source: Ambulatory Visit | Attending: Obstetrics and Gynecology | Admitting: Obstetrics and Gynecology

## 2017-02-04 DIAGNOSIS — Z1231 Encounter for screening mammogram for malignant neoplasm of breast: Secondary | ICD-10-CM

## 2017-02-09 ENCOUNTER — Encounter (HOSPITAL_COMMUNITY)
Admission: RE | Admit: 2017-02-09 | Discharge: 2017-02-09 | Disposition: A | Discharge status: Home / Self Care | Payer: BLUE CROSS/BLUE SHIELD | Source: Ambulatory Visit | Attending: Neurosurgery | Admitting: Neurosurgery

## 2017-02-09 LAB — CBC
HEMATOCRIT: 34.7 % — AB (ref 36.0–46.0)
HEMOGLOBIN: 11.3 g/dL — AB (ref 12.0–15.0)
MCH: 30.2 pg (ref 26.0–34.0)
MCHC: 32.6 g/dL (ref 30.0–36.0)
MCV: 92.8 fL (ref 78.0–100.0)
Platelets: 268 10*3/uL (ref 150–400)
RBC: 3.74 MIL/uL — AB (ref 3.87–5.11)
RDW: 13.9 % (ref 11.5–15.5)
WBC: 17.3 10*3/uL — ABNORMAL HIGH (ref 4.0–10.5)

## 2017-02-09 LAB — TYPE AND SCREEN
ABO/RH(D): A POS
ANTIBODY SCREEN: NEGATIVE

## 2017-02-09 LAB — BASIC METABOLIC PANEL
ANION GAP: 10 (ref 5–15)
BUN: 16 mg/dL (ref 6–20)
CO2: 26 mmol/L (ref 22–32)
Calcium: 9.9 mg/dL (ref 8.9–10.3)
Chloride: 101 mmol/L (ref 101–111)
Creatinine, Ser: 0.91 mg/dL (ref 0.44–1.00)
GLUCOSE: 118 mg/dL — AB (ref 65–99)
POTASSIUM: 4.4 mmol/L (ref 3.5–5.1)
Sodium: 137 mmol/L (ref 135–145)

## 2017-02-09 LAB — SURGICAL PCR SCREEN
MRSA, PCR: NEGATIVE
STAPHYLOCOCCUS AUREUS: POSITIVE — AB

## 2017-02-09 NOTE — Pre-Procedure Instructions (Addendum)
Carmen Reynolds  02/09/2017      [facility] - [place] - [address] [place] [address] Phone: [phone] Fax: [phone]  [facility] [place] [place] [facility] [address] [place] [address] Phone: [phone] Fax: [phone]  [id number] [place] [address]: [phone]: [phone]   Your procedure is scheduled on February 12, 2017 Friday   [facility]   Call this number if you have problems the morning of surgery:  [phone]   Remember:  Do not eat food or drink liquids after midnight.   Take these medicines the morning of surgery with A SIP OF WATER bupropion(Wellbutrin),Duloxetine(Cymbalta),Nexium,Allegra if needed,Hydrocodone if needed for pain,Imodium if needed,Mesalamine(Asacol),prednisone if needed,vesicare,verapamil     STOP ASPIRIN,ANTIINFLAMATORIES (IBUPROFEN,ALEVE,MOTRIN,ADVIL,GOODY'S POWDERS),HERBAL SUPPLEMENTS,FISH OIL,AND VITAMINS 5-7 DAYS PRIOR TO SURGERY   Do not wear jewelry, make-up or nail polish.  Do not wear lotions, powders, or perfumes, or deoderant.  Do not shave 48 hours prior to surgery.  Men may shave face and neck.  Do not bring valuables to the hospital.  [facility] or [facility].  Contacts, dentures or bridgework may not be worn into surgery.  Leave your suitcase in the car.  After surgery it may be brought to your room.  For patients admitted to the hospital, discharge time will be determined by your treatment team.  Patients discharged the day of surgery will not be allowed to drive home.    Special Instructions: [facility] - Preparing for Surgery  Before surgery, you can play an important role.  Because skin is not  sterile, your skin needs to be as free of germs as possible.  You can reduce the number of germs on you skin by washing with CHG (chlorahexidine gluconate) soap before surgery.  CHG is an antiseptic cleaner which kills germs and bonds with the skin to continue killing germs even after washing.  Please DO NOT use if you have an allergy to CHG or antibacterial soaps.  If your skin becomes reddened/irritated stop using the CHG and inform your nurse when you arrive at Short Stay.  Do not shave (including legs and underarms) for at least 48 hours prior to the first CHG shower.  You may shave your face.  Please follow these instructions carefully:   1.  Shower with CHG Soap the night before surgery and the   morning of Surgery.  2.  If you choose to wash your hair, wash your hair first as usual with your normal shampoo.  3.  After you shampoo, rinse your hair and body thoroughly to remove the  Shampoo.  4.  Use CHG as you would any other liquid soap.  You can apply chg directly  to the skin and wash gently with scrungie or a clean washcloth.  5.  Apply the CHG Soap to your body ONLY FROM THE NECK DOWN.   Do not use on open wounds or open sores.  Avoid contact with your eyes,  ears, mouth and genitals (private parts).  Wash genitals (private parts) with your normal soap.  6.  Wash thoroughly, paying special attention to the area where your surgery will be performed.  7.  Thoroughly rinse your body with warm  water from the neck down.  8.  DO NOT shower/wash with your normal soap after using and rinsing o  the CHG Soap.  9.  Pat yourself dry with a clean towel.            10.  Wear clean pajamas.            11.  Place clean sheets on your bed the night of your first shower and do not sleep with pets.  Day of Surgery  Do not apply any lotions/deodorants the morning of surgery.  Please wear clean clothes to the hospital/surgery center.   Please read over the following fact sheets that you were  given. Pain Booklet, Coughing and Deep Breathing and Surgical Site Infection Prevention

## 2017-02-10 NOTE — Progress Notes (Signed)
Anesthesia Chart Review:  Pt is a 66 year old female scheduled for C3-4, C4-5 ACDF and removal of plate at M3-3 on 01/06/2448 with Erline Levine, M.D.  - PCP is Unk Pinto, MD - GI is Juanita Craver, MD  PMH includes: HTN, hyperlipidemia, factor V Leiden, scoliosis, ulcerative colitis. Former smoker. BMI 23. S/p cholecystectomy 11/22/13. S/p lumbar fusion 07/15/12, 09/22/12.   Medications include: Nexium, olmesartan, prednisone, verapamil  Preoperative labs reviewed.  - WBC 17.3. This is consistent with results 11/12/16 and 08/05/16.  Pt is on chronic daily prednisone for ulcerative colitis.  I notified Janett Billow in Dr. Melven Sartorius office of elevated WBC count.   EKG 11/12/16: Sinus rhythm. Poor R wave progression. Low QRS voltage in limb leads.  Nuclear stress test 05/30/13: Low risk stress nuclear study with a small, mild intensity, reversible apical defect most likely related to apical thinning; cannot R/O mild apical ischemia. LV Ejection Fraction: 61%.  LV Wall Motion:  NL LV Function; NL Wall Motion  Echo 05/30/13:  - Left ventricle: The cavity size was normal. There was mildconcentric hypertrophy. Systolic function was normal. Theestimated ejection fraction was in the range of 55% to60%. - Atrial septum: No defect or patent foramen ovale was identified.  If no changes, I anticipate pt can proceed with surgery as scheduled.   Willeen Cass, FNP-BC [facility] Stay Surgical Center/Anesthesiology Phone: [phone] 02/10/2017 1:28 PM'

## 2017-02-11 ENCOUNTER — Other Ambulatory Visit: Payer: Self-pay | Admitting: Internal Medicine

## 2017-02-11 MED ORDER — VANCOMYCIN HCL IN DEXTROSE 1-5 GM/200ML-% IV SOLN
1000.0000 mg | INTRAVENOUS | Status: AC
  Administered 2017-02-12: 1000 mg via INTRAVENOUS
  Filled 2017-02-11: qty 200

## 2017-02-11 NOTE — H&P (Signed)
Patient ID:   [id number] Patient: Carmen Reynolds  Date of Birth: [birth year] Visit Type: Office Visit   Date: 01/20/2017 11:45 AM Provider: Marchia Meiers. Vertell Limber MD   This 66 year old female presents for neck pain.   History of Present Illness: 1.  neck pain  Patient returns noting numbness, tingling, pain left neck, jaw, shoulder and deltoid x2 months.  She recalls no injury.  (ACDF C5-6, C6-02 May 2006)  MRI on [facility]           MEDICATIONS(added, continued or stopped this visit):   ALLERGIES: Ingredient Reaction Medication Name Comment  PREGABALIN  Lyrica       Vitals Date Temp F BP Pulse Ht In Wt Lb BMI BSA Pain Score  01/20/2017  129/85 92 65 134.4 22.37  6/10      IMPRESSION Patient reports left neck pain radiating to the left arm. On confrontational testing, positive left spurling, 4 left deltoid weakness. MRI shows disc herniation at C3-4 and C4-5 resulting in spinal stenosis. X-ray shows worsening scoliosis and arthritis in the cervical spine. Patient will be schedule for C3-4 and C4-5 ACDF and exploration fusion C5-7.     Pain Management Plan Pain Scale: 6/10. Method: Numeric Pain Intensity Scale. Location: neck. Onset: 12/09/2016. Pain management follow-up plan of care: Patient does positional changes for relief.  Fall Risk Plan The patient has not fallen in the last year.  Patient will be scheduled for C3-4 and C4-5 ACDF and exploration fusion C5-7. Fit for cervical collar. Nurse education given.              Provider:  Vertell Limber MD, Marchia Meiers 01/20/2017 1:26 PM  Dictation edited by: Lucita Lora    CC Providers: Unk Pinto [facility] [address] [place],  [place]  [address]-   James Kramer  [address] [place], [place] [address]-              Electronically signed by Marchia Meiers. Vertell Limber MD on 01/22/2017 05:51 PM   > [place], [place]  [address] Phone: [phone]   Patient ID:   [id number] Patient: Carmen Reynolds  Date of Birth: Jul 30, 1950 Visit Type: Office Visit   Date: 01/17/2014 09:00 AM Provider: Marchia Meiers. Vertell Limber MD   This 66 year old female presents for Follow Up of back pain.  History of Present Illness: 1.  Follow Up of back pain  Carmen Reynolds returns reporting persistent back pain since weeding in her yard for several hours over a month ago.  She rec'd muscle relaxers at an urgent care while out of town, but they provided no lasting relief.   Hx: 04-06-13 C4-5 MBB by DrHarkins       09-22-12  T12-L2 XLIF with fixation T10-L2       03-2007  L2-S1 fusion  X-ray on [facility]  Patient had ankle surgery in November 2014 and a cholecystectomy in April 2015.  She says her pain generally H between 5-15 out of 10 in severity.  I obtained thoracolumbar radiographs of scoliosis repair and these are stable.  I do not see significant evidence of thoracic degenerative changes of the adjacent segment degeneration.  I recommended some physical therapy and also massage therapy.  He still recovering from her cholecystectomy and thinks that some of her pain in her back may relate to this.  Strength is full and lower extremities on confrontational testing.  She is some vertebral  discomfort palpation on the right at the upper aspect of her previous surgery.      Medical/Surgical/Interim History Reviewed, no change.    PAST MEDICAL HISTORY, SURGICAL HISTORY, FAMILY HISTORY, SOCIAL HISTORY AND REVIEW OF SYSTEMS I have reviewed the patient's past medical, surgical, family and social history as well as the comprehensive review of systems as included on the [facility] history form dated 01/17/2014, which I have signed.  Family History: Reviewed, no changes.    Social History: Tobacco use reviewed. Reviewed, no changes.     MEDICATIONS(added, continued or stopped this visit):   Started  Medication Directions Instruction Stopped  04/21/2013 Valium 5 mg tablet take 1 tablet by oral route  every 6 hours as needed for spasms  01/17/2014    ALLERGIES:  Ingredient Reaction Medication Name Comment  PREGABALIN  Lyrica   Reviewed, no changes.   Vitals Date Temp F BP Pulse Ht In Wt Lb BMI BSA Pain Score  01/17/2014  152/85 93 65 140 23.3  5/10        IMPRESSION Thoracic strain after 10 hours of weeding with neck pain.  I suggested physical therapy and massage therapy  Completed Orders (this encounter) Order Details Reason Side Interpretation Result Initial Treatment Date Region  Hypertension education Follow up with primary care physician.        Thoraco-lumbar Spine- AP/Lat Focus on T10 region     01/17/2014 T10 to T10   Assessment/Plan # Detail Type Description   1. Assessment Lumbar scoliosis (737.30).       2. Assessment Thoracic back pain (724.1).       3. Assessment Hypertension, Unspecified (401.9).         Pain Assessment/Treatment Pain Scale: 5/10. Method: Numeric Pain Intensity Scale. Location: back. Onset: 08/19/2013. Duration: varies. Quality: aching, sharp. Pain Assessment/Treatment follow-up plan of care: Patient not taking any pain medication..  Return visit in 3 months for recheck  Orders: Diagnostic Procedures: Assessment Procedure   Thoraco-lumbar Spine- AP/Lat  Instruction(s)/Education: Assessment Instruction  401.9 Hypertension education             Provider:  Marchia Meiers. Vertell Limber MD  01/17/2014 10:20 AM Dictation edited by: Marchia Meiers. Vertell Limber    CC Providers: [place],  [id number]- ----------------------------------------------------------------------------------------------------------------------------------------------------------------------         Electronically signed by Marchia Meiers Vertell Limber MD on 01/17/2014 10:20 AM

## 2017-02-12 ENCOUNTER — Inpatient Hospital Stay (HOSPITAL_COMMUNITY): Payer: BLUE CROSS/BLUE SHIELD | Admitting: Emergency Medicine

## 2017-02-12 ENCOUNTER — Inpatient Hospital Stay (HOSPITAL_COMMUNITY): Payer: BLUE CROSS/BLUE SHIELD | Admitting: Certified Registered Nurse Anesthetist

## 2017-02-12 ENCOUNTER — Inpatient Hospital Stay (HOSPITAL_COMMUNITY)
Admission: RE | Disposition: A | Discharge status: Home / Self Care | Payer: Self-pay | Source: Ambulatory Visit | Attending: Neurosurgery

## 2017-02-12 ENCOUNTER — Inpatient Hospital Stay (HOSPITAL_COMMUNITY): Payer: BLUE CROSS/BLUE SHIELD

## 2017-02-12 ENCOUNTER — Inpatient Hospital Stay (HOSPITAL_COMMUNITY)
Admission: RE | Admit: 2017-02-12 | Discharge: 2017-02-13 | DRG: 472 | Disposition: A | Discharge status: Home / Self Care | Payer: BLUE CROSS/BLUE SHIELD | Source: Ambulatory Visit | Attending: Neurosurgery | Admitting: Neurosurgery

## 2017-02-12 ENCOUNTER — Encounter (HOSPITAL_COMMUNITY): Payer: Self-pay | Admitting: *Deleted

## 2017-02-12 DIAGNOSIS — M5011 Cervical disc disorder with radiculopathy,  high cervical region: Principal | ICD-10-CM | POA: Diagnosis present

## 2017-02-12 DIAGNOSIS — Z7951 Long term (current) use of inhaled steroids: Secondary | ICD-10-CM | POA: Diagnosis not present

## 2017-02-12 DIAGNOSIS — E739 Lactose intolerance, unspecified: Secondary | ICD-10-CM | POA: Diagnosis present

## 2017-02-12 DIAGNOSIS — M797 Fibromyalgia: Secondary | ICD-10-CM | POA: Diagnosis present

## 2017-02-12 DIAGNOSIS — Z886 Allergy status to analgesic agent status: Secondary | ICD-10-CM | POA: Diagnosis not present

## 2017-02-12 DIAGNOSIS — M5001 Cervical disc disorder with myelopathy,  high cervical region: Secondary | ICD-10-CM | POA: Diagnosis present

## 2017-02-12 DIAGNOSIS — K219 Gastro-esophageal reflux disease without esophagitis: Secondary | ICD-10-CM | POA: Diagnosis present

## 2017-02-12 DIAGNOSIS — Z7983 Long term (current) use of bisphosphonates: Secondary | ICD-10-CM | POA: Diagnosis not present

## 2017-02-12 DIAGNOSIS — Z881 Allergy status to other antibiotic agents status: Secondary | ICD-10-CM

## 2017-02-12 DIAGNOSIS — Z7952 Long term (current) use of systemic steroids: Secondary | ICD-10-CM

## 2017-02-12 DIAGNOSIS — M5020 Other cervical disc displacement, unspecified cervical region: Secondary | ICD-10-CM | POA: Diagnosis present

## 2017-02-12 DIAGNOSIS — Z87891 Personal history of nicotine dependence: Secondary | ICD-10-CM | POA: Diagnosis not present

## 2017-02-12 DIAGNOSIS — N289 Disorder of kidney and ureter, unspecified: Secondary | ICD-10-CM | POA: Diagnosis present

## 2017-02-12 DIAGNOSIS — I10 Essential (primary) hypertension: Secondary | ICD-10-CM | POA: Diagnosis present

## 2017-02-12 DIAGNOSIS — Z888 Allergy status to other drugs, medicaments and biological substances status: Secondary | ICD-10-CM | POA: Diagnosis not present

## 2017-02-12 DIAGNOSIS — M542 Cervicalgia: Secondary | ICD-10-CM | POA: Diagnosis not present

## 2017-02-12 DIAGNOSIS — Z419 Encounter for procedure for purposes other than remedying health state, unspecified: Secondary | ICD-10-CM

## 2017-02-12 HISTORY — PX: ANTERIOR CERVICAL DECOMP/DISCECTOMY FUSION: SHX1161

## 2017-02-12 SURGERY — ANTERIOR CERVICAL DECOMPRESSION/DISCECTOMY FUSION 2 LEVEL/HARDWARE REMOVAL
Anesthesia: General

## 2017-02-12 MED ORDER — PROMETHAZINE HCL 25 MG/ML IJ SOLN: 6.2500 mg | INTRAMUSCULAR | Status: DC | PRN

## 2017-02-12 MED ORDER — PANTOPRAZOLE SODIUM 40 MG PO TBEC
40.0000 mg | DELAYED_RELEASE_TABLET | Freq: Every day | ORAL | Status: DC
  Filled 2017-02-12: qty 1

## 2017-02-12 MED ORDER — LOPERAMIDE HCL 2 MG PO CAPS
2.0000 mg | ORAL_CAPSULE | ORAL | Status: DC | PRN
  Filled 2017-02-12: qty 1

## 2017-02-12 MED ORDER — RED YEAST RICE EXTRACT 600 MG PO TABS: 1200.0000 mg | ORAL_TABLET | Freq: Two times a day (BID) | ORAL | Status: DC

## 2017-02-12 MED ORDER — THROMBIN 5000 UNITS EX SOLR
OROMUCOSAL | Status: DC | PRN
  Administered 2017-02-12 (×2): 5 mL via TOPICAL

## 2017-02-12 MED ORDER — LIDOCAINE 2% (20 MG/ML) 5 ML SYRINGE
INTRAMUSCULAR | Status: DC | PRN
  Administered 2017-02-12: 50 mg via INTRAVENOUS

## 2017-02-12 MED ORDER — FENTANYL CITRATE (PF) 250 MCG/5ML IJ SOLN
INTRAMUSCULAR | Status: AC
  Filled 2017-02-12: qty 5

## 2017-02-12 MED ORDER — MESALAMINE 1000 MG RE SUPP
1000.0000 mg | Freq: Every evening | RECTAL | Status: DC | PRN
  Filled 2017-02-12: qty 1

## 2017-02-12 MED ORDER — ACETAMINOPHEN 650 MG RE SUPP: 650.0000 mg | RECTAL | Status: DC | PRN

## 2017-02-12 MED ORDER — PHENOL 1.4 % MT LIQD
1.0000 | OROMUCOSAL | Status: DC | PRN
  Administered 2017-02-12: 1 via OROMUCOSAL
  Filled 2017-02-12: qty 177

## 2017-02-12 MED ORDER — MORPHINE SULFATE (PF) 4 MG/ML IV SOLN: 2.0000 mg | INTRAVENOUS | Status: DC | PRN

## 2017-02-12 MED ORDER — THROMBIN 20000 UNITS EX SOLR
CUTANEOUS | Status: DC | PRN
  Administered 2017-02-12: 20 mL via TOPICAL

## 2017-02-12 MED ORDER — CO Q 10 100 MG PO CAPS: 200.0000 mg | ORAL_CAPSULE | Freq: Two times a day (BID) | ORAL | Status: DC

## 2017-02-12 MED ORDER — METHOCARBAMOL 1000 MG/10ML IJ SOLN: 500.0000 mg | Freq: Four times a day (QID) | INTRAMUSCULAR | Status: DC | PRN

## 2017-02-12 MED ORDER — ARTIFICIAL TEARS OPHTHALMIC OINT
TOPICAL_OINTMENT | OPHTHALMIC | Status: AC
  Filled 2017-02-12: qty 10.5

## 2017-02-12 MED ORDER — THROMBIN 5000 UNITS EX SOLR
CUTANEOUS | Status: AC
  Filled 2017-02-12: qty 5000

## 2017-02-12 MED ORDER — ONDANSETRON HCL 4 MG/2ML IJ SOLN
INTRAMUSCULAR | Status: DC | PRN
  Administered 2017-02-12: 4 mg via INTRAVENOUS

## 2017-02-12 MED ORDER — VANCOMYCIN HCL 500 MG IV SOLR
500.0000 mg | Freq: Two times a day (BID) | INTRAVENOUS | Status: DC
  Administered 2017-02-12: 500 mg via INTRAVENOUS
  Filled 2017-02-12 (×2): qty 500

## 2017-02-12 MED ORDER — BUPIVACAINE HCL (PF) 0.5 % IJ SOLN
INTRAMUSCULAR | Status: AC
  Filled 2017-02-12: qty 30

## 2017-02-12 MED ORDER — METHOCARBAMOL 500 MG PO TABS
ORAL_TABLET | ORAL | Status: AC
  Filled 2017-02-12: qty 1

## 2017-02-12 MED ORDER — MENTHOL 3 MG MT LOZG: 1.0000 | LOZENGE | OROMUCOSAL | Status: DC | PRN

## 2017-02-12 MED ORDER — DOCUSATE SODIUM 100 MG PO CAPS
100.0000 mg | ORAL_CAPSULE | Freq: Two times a day (BID) | ORAL | Status: DC
  Administered 2017-02-12: 100 mg via ORAL
  Filled 2017-02-12: qty 1

## 2017-02-12 MED ORDER — FENTANYL CITRATE (PF) 100 MCG/2ML IJ SOLN
INTRAMUSCULAR | Status: DC | PRN
  Administered 2017-02-12 (×6): 50 ug via INTRAVENOUS

## 2017-02-12 MED ORDER — FLEET ENEMA 7-19 GM/118ML RE ENEM: 1.0000 | ENEMA | Freq: Once | RECTAL | Status: DC | PRN

## 2017-02-12 MED ORDER — FLUTICASONE PROPIONATE 50 MCG/ACT NA SUSP: 2.0000 | Freq: Every day | NASAL | Status: DC

## 2017-02-12 MED ORDER — PROPOFOL 10 MG/ML IV BOLUS
INTRAVENOUS | Status: DC | PRN
  Administered 2017-02-12: 130 mg via INTRAVENOUS

## 2017-02-12 MED ORDER — SODIUM CHLORIDE 0.9% FLUSH
3.0000 mL | Freq: Two times a day (BID) | INTRAVENOUS | Status: DC
  Administered 2017-02-12: 3 mL via INTRAVENOUS

## 2017-02-12 MED ORDER — LACTATED RINGERS IV SOLN: INTRAVENOUS | Status: DC

## 2017-02-12 MED ORDER — OXYCODONE HCL 5 MG PO TABS
5.0000 mg | ORAL_TABLET | ORAL | Status: DC | PRN
  Administered 2017-02-12 – 2017-02-13 (×4): 10 mg via ORAL
  Filled 2017-02-12 (×3): qty 2

## 2017-02-12 MED ORDER — SUGAMMADEX SODIUM 200 MG/2ML IV SOLN
INTRAVENOUS | Status: DC | PRN
  Administered 2017-02-12: 200 mg via INTRAVENOUS

## 2017-02-12 MED ORDER — BUPIVACAINE HCL (PF) 0.5 % IJ SOLN
INTRAMUSCULAR | Status: DC | PRN
  Administered 2017-02-12: 5 mL

## 2017-02-12 MED ORDER — MIDAZOLAM HCL 2 MG/2ML IJ SOLN
INTRAMUSCULAR | Status: AC
  Filled 2017-02-12: qty 2

## 2017-02-12 MED ORDER — DARIFENACIN HYDROBROMIDE ER 7.5 MG PO TB24
7.5000 mg | ORAL_TABLET | Freq: Every day | ORAL | Status: DC
  Filled 2017-02-12: qty 1

## 2017-02-12 MED ORDER — SUGAMMADEX SODIUM 200 MG/2ML IV SOLN
INTRAVENOUS | Status: AC
  Filled 2017-02-12: qty 2

## 2017-02-12 MED ORDER — DEXAMETHASONE SODIUM PHOSPHATE 4 MG/ML IJ SOLN
2.0000 mg | Freq: Four times a day (QID) | INTRAMUSCULAR | Status: DC
  Administered 2017-02-12 – 2017-02-13 (×2): 2 mg via INTRAVENOUS
  Filled 2017-02-12 (×2): qty 1

## 2017-02-12 MED ORDER — LORATADINE 10 MG PO TABS
10.0000 mg | ORAL_TABLET | Freq: Every day | ORAL | Status: DC
  Filled 2017-02-12: qty 1

## 2017-02-12 MED ORDER — VERAPAMIL HCL 120 MG PO TABS
240.0000 mg | ORAL_TABLET | Freq: Every day | ORAL | Status: DC
  Filled 2017-02-12: qty 2

## 2017-02-12 MED ORDER — CALCIUM CARBONATE-VITAMIN D 500-200 MG-UNIT PO TABS
1.0000 | ORAL_TABLET | Freq: Every day | ORAL | Status: DC
  Filled 2017-02-12: qty 1

## 2017-02-12 MED ORDER — THROMBIN 20000 UNITS EX SOLR
CUTANEOUS | Status: AC
  Filled 2017-02-12: qty 20000

## 2017-02-12 MED ORDER — ZOLPIDEM TARTRATE 5 MG PO TABS: 5.0000 mg | ORAL_TABLET | Freq: Every evening | ORAL | Status: DC | PRN

## 2017-02-12 MED ORDER — ONDANSETRON HCL 4 MG/2ML IJ SOLN: 4.0000 mg | Freq: Four times a day (QID) | INTRAMUSCULAR | Status: DC | PRN

## 2017-02-12 MED ORDER — CHLORHEXIDINE GLUCONATE CLOTH 2 % EX PADS: 6.0000 | MEDICATED_PAD | Freq: Once | CUTANEOUS | Status: DC

## 2017-02-12 MED ORDER — ROCURONIUM BROMIDE 50 MG/5ML IV SOLN
INTRAVENOUS | Status: AC
  Filled 2017-02-12: qty 1

## 2017-02-12 MED ORDER — MUPIROCIN 2 % EX OINT
TOPICAL_OINTMENT | CUTANEOUS | Status: AC
  Filled 2017-02-12: qty 22

## 2017-02-12 MED ORDER — ONDANSETRON HCL 4 MG/2ML IJ SOLN
INTRAMUSCULAR | Status: AC
  Filled 2017-02-12: qty 2

## 2017-02-12 MED ORDER — BISACODYL 10 MG RE SUPP: 10.0000 mg | Freq: Every day | RECTAL | Status: DC | PRN

## 2017-02-12 MED ORDER — DEXAMETHASONE SODIUM PHOSPHATE 10 MG/ML IJ SOLN
INTRAMUSCULAR | Status: AC
  Filled 2017-02-12: qty 1

## 2017-02-12 MED ORDER — HYDROMORPHONE HCL 1 MG/ML IJ SOLN
INTRAMUSCULAR | Status: AC
  Filled 2017-02-12: qty 0.5

## 2017-02-12 MED ORDER — OLMESARTAN MEDOXOMIL 40 MG PO TABS
40.0000 mg | ORAL_TABLET | Freq: Every day | ORAL | Status: DC
  Filled 2017-02-12: qty 1

## 2017-02-12 MED ORDER — HYDROCODONE-ACETAMINOPHEN 5-325 MG PO TABS: 1.0000 | ORAL_TABLET | Freq: Four times a day (QID) | ORAL | Status: DC | PRN

## 2017-02-12 MED ORDER — OXYCODONE HCL 5 MG PO TABS
ORAL_TABLET | ORAL | Status: AC
  Administered 2017-02-12: 10 mg via ORAL
  Filled 2017-02-12: qty 2

## 2017-02-12 MED ORDER — METHOCARBAMOL 500 MG PO TABS
500.0000 mg | ORAL_TABLET | Freq: Four times a day (QID) | ORAL | Status: DC | PRN
  Administered 2017-02-12: 500 mg via ORAL
  Filled 2017-02-12: qty 1

## 2017-02-12 MED ORDER — HYDROMORPHONE HCL 1 MG/ML IJ SOLN
0.2500 mg | INTRAMUSCULAR | Status: DC | PRN
  Administered 2017-02-12 (×2): 0.5 mg via INTRAVENOUS

## 2017-02-12 MED ORDER — SENNOSIDES-DOCUSATE SODIUM 8.6-50 MG PO TABS: 1.0000 | ORAL_TABLET | Freq: Every evening | ORAL | Status: DC | PRN

## 2017-02-12 MED ORDER — PROMETHAZINE-DM 6.25-15 MG/5ML PO SYRP: 5.0000 mL | ORAL_SOLUTION | Freq: Four times a day (QID) | ORAL | Status: DC | PRN

## 2017-02-12 MED ORDER — LIDOCAINE-EPINEPHRINE 1 %-1:100000 IJ SOLN
INTRAMUSCULAR | Status: AC
  Filled 2017-02-12: qty 1

## 2017-02-12 MED ORDER — BUSPIRONE HCL 15 MG PO TABS
30.0000 mg | ORAL_TABLET | Freq: Every day | ORAL | Status: DC
  Administered 2017-02-12: 30 mg via ORAL
  Filled 2017-02-12: qty 2

## 2017-02-12 MED ORDER — IBANDRONATE SODIUM 150 MG PO TABS: 150.0000 mg | ORAL_TABLET | ORAL | Status: DC

## 2017-02-12 MED ORDER — VITAMIN C 500 MG PO TABS
625.0000 mg | ORAL_TABLET | Freq: Every day | ORAL | Status: DC
  Filled 2017-02-12: qty 1

## 2017-02-12 MED ORDER — 0.9 % SODIUM CHLORIDE (POUR BTL) OPTIME
TOPICAL | Status: DC | PRN
  Administered 2017-02-12: 1000 mL

## 2017-02-12 MED ORDER — DULOXETINE HCL 30 MG PO CPEP: 60.0000 mg | ORAL_CAPSULE | Freq: Every day | ORAL | Status: DC

## 2017-02-12 MED ORDER — VITAMIN D 1000 UNITS PO TABS
5000.0000 [IU] | ORAL_TABLET | Freq: Two times a day (BID) | ORAL | Status: DC
  Administered 2017-02-12: 5000 [IU] via ORAL
  Filled 2017-02-12: qty 5

## 2017-02-12 MED ORDER — DEXAMETHASONE SODIUM PHOSPHATE 10 MG/ML IJ SOLN
INTRAMUSCULAR | Status: DC | PRN
  Administered 2017-02-12: 10 mg via INTRAVENOUS

## 2017-02-12 MED ORDER — PANTOPRAZOLE SODIUM 40 MG IV SOLR: 40.0000 mg | Freq: Every day | INTRAVENOUS | Status: DC

## 2017-02-12 MED ORDER — KCL IN DEXTROSE-NACL 20-5-0.45 MEQ/L-%-% IV SOLN: INTRAVENOUS | Status: DC

## 2017-02-12 MED ORDER — ROCURONIUM BROMIDE 10 MG/ML (PF) SYRINGE
PREFILLED_SYRINGE | INTRAVENOUS | Status: DC | PRN
  Administered 2017-02-12: 30 mg via INTRAVENOUS
  Administered 2017-02-12: 20 mg via INTRAVENOUS
  Administered 2017-02-12: 50 mg via INTRAVENOUS

## 2017-02-12 MED ORDER — METHYLCELLULOSE (LAXATIVE) 500 MG PO TABS: 1000.0000 mg | ORAL_TABLET | Freq: Two times a day (BID) | ORAL | Status: DC

## 2017-02-12 MED ORDER — LACTATED RINGERS IV SOLN
INTRAVENOUS | Status: DC | PRN
  Administered 2017-02-12 (×2): via INTRAVENOUS

## 2017-02-12 MED ORDER — MEPERIDINE HCL 25 MG/ML IJ SOLN: 6.2500 mg | INTRAMUSCULAR | Status: DC | PRN

## 2017-02-12 MED ORDER — SODIUM CHLORIDE 0.9% FLUSH: 3.0000 mL | INTRAVENOUS | Status: DC | PRN

## 2017-02-12 MED ORDER — ONDANSETRON HCL 4 MG PO TABS: 4.0000 mg | ORAL_TABLET | Freq: Four times a day (QID) | ORAL | Status: DC | PRN

## 2017-02-12 MED ORDER — LIDOCAINE-EPINEPHRINE 1 %-1:100000 IJ SOLN
INTRAMUSCULAR | Status: DC | PRN
  Administered 2017-02-12: 5 mL

## 2017-02-12 MED ORDER — PHENYLEPHRINE HCL 10 MG/ML IJ SOLN
INTRAVENOUS | Status: DC | PRN
  Administered 2017-02-12: 25 ug/min via INTRAVENOUS

## 2017-02-12 MED ORDER — ACETAMINOPHEN 325 MG PO TABS: 650.0000 mg | ORAL_TABLET | ORAL | Status: DC | PRN

## 2017-02-12 MED ORDER — LACTATED RINGERS IV SOLN
INTRAVENOUS | Status: DC
  Administered 2017-02-12: 09:00:00 via INTRAVENOUS

## 2017-02-12 MED ORDER — HYDROMORPHONE HCL 1 MG/ML IJ SOLN
INTRAMUSCULAR | Status: AC
  Administered 2017-02-12: 0.5 mg via INTRAVENOUS
  Filled 2017-02-12: qty 0.5

## 2017-02-12 MED ORDER — PROPOFOL 10 MG/ML IV BOLUS
INTRAVENOUS | Status: AC
  Filled 2017-02-12: qty 20

## 2017-02-12 MED ORDER — MUPIROCIN 2 % EX OINT
1.0000 "application " | TOPICAL_OINTMENT | Freq: Two times a day (BID) | CUTANEOUS | Status: DC
  Administered 2017-02-12: 1 via TOPICAL

## 2017-02-12 MED ORDER — MESALAMINE 400 MG PO CPDR
1600.0000 mg | DELAYED_RELEASE_CAPSULE | Freq: Two times a day (BID) | ORAL | Status: DC
  Administered 2017-02-12: 1600 mg via ORAL
  Filled 2017-02-12 (×2): qty 4

## 2017-02-12 MED ORDER — ALUM & MAG HYDROXIDE-SIMETH 200-200-20 MG/5ML PO SUSP: 30.0000 mL | Freq: Four times a day (QID) | ORAL | Status: DC | PRN

## 2017-02-12 MED ORDER — BUPROPION HCL ER (XL) 150 MG PO TB24
150.0000 mg | ORAL_TABLET | Freq: Every morning | ORAL | Status: DC
Start: 2017-02-13 — End: 2017-02-13
  Filled 2017-02-12: qty 1

## 2017-02-12 MED ORDER — MIDAZOLAM HCL 5 MG/5ML IJ SOLN
INTRAMUSCULAR | Status: DC | PRN
  Administered 2017-02-12: 2 mg via INTRAVENOUS

## 2017-02-12 SURGICAL SUPPLY — 71 items
BASKET BONE COLLECTION (BASKET) ×3 IMPLANT
BIT DRILL 12X2.5XAVTR (BIT) ×1 IMPLANT
BIT DRILL AVIATOR 12 (BIT) ×1
BIT DRILL AVIATOR 12MM (BIT) ×1
BIT DRILL NEURO 2X3.1 SFT TUCH (MISCELLANEOUS) ×1 IMPLANT
BIT DRL 12X2.5XAVTR (BIT) ×1
BLADE ULTRA TIP 2M (BLADE) IMPLANT
BNDG GAUZE ELAST 4 BULKY (GAUZE/BANDAGES/DRESSINGS) IMPLANT
BUR BARREL STRAIGHT FLUTE 4.0 (BURR) ×3 IMPLANT
CANISTER SUCT 3000ML PPV (MISCELLANEOUS) ×3 IMPLANT
CARTRIDGE OIL MAESTRO DRILL (MISCELLANEOUS) ×1 IMPLANT
COVER MAYO STAND STRL (DRAPES) ×3 IMPLANT
DECANTER SPIKE VIAL GLASS SM (MISCELLANEOUS) ×3 IMPLANT
DERMABOND ADVANCED (GAUZE/BANDAGES/DRESSINGS) ×2
DERMABOND ADVANCED .7 DNX12 (GAUZE/BANDAGES/DRESSINGS) ×1 IMPLANT
DIFFUSER DRILL AIR PNEUMATIC (MISCELLANEOUS) ×3 IMPLANT
DRAIN JACKSON PRATT 10MM FLAT (MISCELLANEOUS) ×3 IMPLANT
DRAPE HALF SHEET 40X57 (DRAPES) IMPLANT
DRAPE LAPAROTOMY 100X72 PEDS (DRAPES) ×3 IMPLANT
DRAPE MICROSCOPE LEICA (MISCELLANEOUS) ×3 IMPLANT
DRAPE POUCH INSTRU U-SHP 10X18 (DRAPES) ×3 IMPLANT
DRILL NEURO 2X3.1 SOFT TOUCH (MISCELLANEOUS) ×3
DRSG OPSITE POSTOP 3X4 (GAUZE/BANDAGES/DRESSINGS) ×3 IMPLANT
DURAPREP 6ML APPLICATOR 50/CS (WOUND CARE) ×3 IMPLANT
ELECT COATED BLADE 2.86 ST (ELECTRODE) ×3 IMPLANT
ELECT REM PT RETURN 9FT ADLT (ELECTROSURGICAL) ×3
ELECTRODE REM PT RTRN 9FT ADLT (ELECTROSURGICAL) ×1 IMPLANT
EVACUATOR SILICONE 100CC (DRAIN) ×3 IMPLANT
GAUZE SPONGE 4X4 12PLY STRL (GAUZE/BANDAGES/DRESSINGS) IMPLANT
GAUZE SPONGE 4X4 16PLY XRAY LF (GAUZE/BANDAGES/DRESSINGS) IMPLANT
GLOVE BIO SURGEON STRL SZ8 (GLOVE) ×3 IMPLANT
GLOVE BIOGEL PI IND STRL 8 (GLOVE) ×1 IMPLANT
GLOVE BIOGEL PI IND STRL 8.5 (GLOVE) ×1 IMPLANT
GLOVE BIOGEL PI INDICATOR 8 (GLOVE) ×2
GLOVE BIOGEL PI INDICATOR 8.5 (GLOVE) ×2
GLOVE ECLIPSE 8.0 STRL XLNG CF (GLOVE) ×3 IMPLANT
GLOVE EXAM NITRILE LRG STRL (GLOVE) IMPLANT
GLOVE EXAM NITRILE XL STR (GLOVE) IMPLANT
GLOVE EXAM NITRILE XS STR PU (GLOVE) IMPLANT
GOWN STRL REUS W/ TWL LRG LVL3 (GOWN DISPOSABLE) IMPLANT
GOWN STRL REUS W/ TWL XL LVL3 (GOWN DISPOSABLE) IMPLANT
GOWN STRL REUS W/TWL 2XL LVL3 (GOWN DISPOSABLE) IMPLANT
GOWN STRL REUS W/TWL LRG LVL3 (GOWN DISPOSABLE)
GOWN STRL REUS W/TWL XL LVL3 (GOWN DISPOSABLE)
HALTER HD/CHIN CERV TRACTION D (MISCELLANEOUS) ×3 IMPLANT
HEMOSTAT POWDER KIT SURGIFOAM (HEMOSTASIS) IMPLANT
HEMOSTAT POWDER SURGIFOAM 1G (HEMOSTASIS) ×6 IMPLANT
KIT BASIN OR (CUSTOM PROCEDURE TRAY) ×3 IMPLANT
KIT ROOM TURNOVER OR (KITS) ×3 IMPLANT
NEEDLE HYPO 18GX1.5 BLUNT FILL (NEEDLE) ×3 IMPLANT
NEEDLE HYPO 25X1 1.5 SAFETY (NEEDLE) ×3 IMPLANT
NEEDLE SPNL 22GX3.5 QUINCKE BK (NEEDLE) ×9 IMPLANT
NS IRRIG 1000ML POUR BTL (IV SOLUTION) ×3 IMPLANT
OIL CARTRIDGE MAESTRO DRILL (MISCELLANEOUS) ×3
PACK LAMINECTOMY NEURO (CUSTOM PROCEDURE TRAY) ×3 IMPLANT
PAD ARMBOARD 7.5X6 YLW CONV (MISCELLANEOUS) ×3 IMPLANT
PIN DISTRACTION 14MM (PIN) ×9 IMPLANT
PLATE AVIATOR ASSY 2LVL SZ 26 (Plate) ×3 IMPLANT
RUBBERBAND STERILE (MISCELLANEOUS) ×6 IMPLANT
SCREW AVIAT VAR SLFTAP 4.35X12 (Screw) ×6 IMPLANT
SCREW AVIATOR VAR SELFTAP 4X12 (Screw) ×12 IMPLANT
SPACER AVS AS 12X14X5X0 (Orthopedic Implant) ×3 IMPLANT
SPACER AVS AS 4X12X14X4 (Spacer) ×3 IMPLANT
SPONGE INTESTINAL PEANUT (DISPOSABLE) ×3 IMPLANT
SPONGE SURGIFOAM ABS GEL 100 (HEMOSTASIS) IMPLANT
STAPLER SKIN PROX WIDE 3.9 (STAPLE) IMPLANT
SUT VIC AB 3-0 SH 8-18 (SUTURE) ×3 IMPLANT
SYR 3ML LL SCALE MARK (SYRINGE) ×3 IMPLANT
TOWEL GREEN STERILE (TOWEL DISPOSABLE) ×3 IMPLANT
TOWEL GREEN STERILE FF (TOWEL DISPOSABLE) ×3 IMPLANT
WATER STERILE IRR 1000ML POUR (IV SOLUTION) ×3 IMPLANT

## 2017-02-12 NOTE — Interval H&P Note (Signed)
History and Physical Interval Note:  02/12/2017 10:55 AM  Carmen Reynolds  has presented today for surgery, with the diagnosis of Cervical herniated disc  The various methods of treatment have been discussed with the patient and family. After consideration of risks, benefits and other options for treatment, the patient has consented to  Procedure(s) with comments: C3-4 C4-5 Anterior cervical decompression/discectomy/fusion/exploration and removal of plate at C5 to C7 (N/A) - C3-4 C4-5 Anterior cervical decompression/discectomy/fusion/exploration and removal of plate at C5 to C7 as a surgical intervention .  The patient's history has been reviewed, patient examined, no change in status, stable for surgery.  I have reviewed the patient's chart and labs.  Questions were answered to the patient's satisfaction.     [name]

## 2017-02-12 NOTE — Op Note (Signed)
02/12/2017  2:02 PM  PATIENT:  Carmen Reynolds  66 y.o. female  PRE-OPERATIVE DIAGNOSIS:  Cervical herniated disc with stenosis, myelopathy, radiculopathy, scoliosis, cervicalgia   POST-OPERATIVE DIAGNOSIS:   Cervical herniated disc with stenosis, myelopathy, radiculopathy, scoliosis, cervicalgia   PROCEDURE:  Procedure(s) with comments: C3-4 C4-5 Anterior cervical decompression/discectomy/fusion/exploration and removal of plate at C5 to C7 (N/A) - C3-4 C4-5 Anterior cervical decompression/discectomy/fusion/exploration and removal of plate at C5 to C7    SURGEON:  Surgeon(s) and Role:    Erline Levine, MD - Primary    * Ditty, Kevan Ny, MD - Assisting  PHYSICIAN ASSISTANT:   ASSISTANTS: Poteat, RN   ANESTHESIA:   general  EBL:  Total I/O In: 1000 [I.V.:1000] Out: 100 [Blood:100]  BLOOD ADMINISTERED:none  DRAINS: (10) Jackson-Pratt drain(s) with closed bulb suction in the prevertebral space   LOCAL MEDICATIONS USED:  MARCAINE    and LIDOCAINE   SPECIMEN:  No Specimen  DISPOSITION OF SPECIMEN:  N/A  COUNTS:  YES  TOURNIQUET:  * No tourniquets in log *  DICTATION: DICTATION: Patient is 66 year old female with bilateral arm pain and weakness with HNP, spondylosis, stenosis radiculopathy C 34 and C 45 levels.  She has scoliosis and had previously undergone ACDF at C 56 and C 67 levels and has cervicalgia.  PROCEDURE: Patient was brought to operating room and following the smooth and uncomplicated induction of general endotracheal anesthesia her head was placed on a horseshoe head holder she was placed in 5 pounds of Holter traction and her anterior neck was prepped and draped in usual sterile fashion. Prior incision was made on the left side of midline after infiltrating the skin and subcutaneous tissues with local lidocaine. The platysmal layer was incised and subplatysmal dissection was performed exposing the anterior border sternocleidomastoid muscle. Using blunt  dissection the carotid sheath was kept lateral and trachea and esophagus kept medial exposing the anterior cervical spine. The previously placed plate was exposed through dense scar tissue and removed.  The interbody prostheses were inspected and were found to be well-healed without evidence of pseudoarthrosis.   A bent spinal needle was placed it was felt to be the C 45 and C 34 levels and this was confirmed on intraoperative x-ray. Longus coli muscles were taken down from the anterior cervical spine using electrocautery and key elevator and self-retaining retractor was placed exposing the C 34 and C 45 levels. The interspaces were incised and a thorough discectomy was performed. Distraction pins were placed. Initially the C 45 level was operated. Uncinate spurs and central spondylitic ridges were drilled down with a high-speed drill. The spinal cord dura and both C 5 nerve roots were widely decompressed. Hemostasis was assured. After trial sizing a 5 mm peek interbody cage was selected and packed with local autograft. This was tamped into position and countersunk appropriately. Attention was the paid to the C 34 level, where similar decompression was performed.  Uncinate spurs and central spondylitic ridges were drilled down with a high-speed drill. The spinal cord dura and both C 4 nerve roots were widely decompressed. Hemostasis was assured. After trial sizing a 4 mm peek interbody cage was selected and packed with local autograft. This was tamped into position and countersunk appropriately.Distraction weight was removed. A 26 mm Aviator anterior cervical plate was affixed to the cervical spine with 12 mm variable-angle screws 2 at C3, 2 at C4 and 2 at C5. All screws were well-positioned and locking mechanisms were engaged. A  final X ray was obtained which showed well positioned grafts and anterior plate without complicating features. Soft tissues were inspected and found to be in good repair. Hemostasis was  assured with Surgifoam and bipolar cautery. The wound was irrigated. A #10 JP drain was inserted through a separate stab incision. The platysma layer was closed with 3-0 Vicryl stitches and the skin was reapproximated with 3-0 Vicryl subcuticular stitches. The wound was dressed with Dermabond. Counts were correct at the end of the case. Patient was extubated and taken to recovery in stable and satisfactory condition.    PLAN OF CARE: Admit to inpatient   PATIENT DISPOSITION:  PACU - hemodynamically stable.   Delay start of Pharmacological VTE agent (>24hrs) due to surgical blood loss or risk of bleeding: yes

## 2017-02-12 NOTE — Anesthesia Preprocedure Evaluation (Signed)
Anesthesia Evaluation  Patient identified by MRN, date of birth, ID band Patient awake    Reviewed: Allergy & Precautions, NPO status , Patient's Chart, lab work & pertinent test results  Airway Mallampati: IV  TM Distance: >3 FB Neck ROM: Full    Dental  (+) Teeth Intact, Dental Advisory Given   Pulmonary neg pulmonary ROS, former smoker,    breath sounds clear to auscultation       Cardiovascular hypertension, Pt. on medications negative cardio ROS   Rhythm:Regular Rate:Normal     Neuro/Psych negative neurological ROS  negative psych ROS   GI/Hepatic Neg liver ROS, PUD, GERD  Medicated and Controlled,  Endo/Other  negative endocrine ROS  Renal/GU Renal disease     Musculoskeletal  (+) Arthritis , Osteoarthritis,  Fibromyalgia -  Abdominal   Peds  Hematology negative hematology ROS (+)   Anesthesia Other Findings UE tingling.   Reproductive/Obstetrics                             Lab Results  Component Value Date   WBC 17.3 (H) 02/09/2017   HGB 11.3 (L) 02/09/2017   HCT 34.7 (L) 02/09/2017   MCV 92.8 02/09/2017   PLT 268 02/09/2017   Lab Results  Component Value Date   CREATININE 0.91 02/09/2017   BUN 16 02/09/2017   NA 137 02/09/2017   K 4.4 02/09/2017   CL 101 02/09/2017   CO2 26 02/09/2017   No results found for: INR, PROTIME  EKG: normal sinus rhythm.   Anesthesia Physical Anesthesia Plan  ASA: II  Anesthesia Plan: General   Post-op Pain Management:    Induction: Intravenous  PONV Risk Score and Plan: 4 or greater and Ondansetron, Dexamethasone, Propofol, Midazolam and Scopolamine patch - Pre-op  Airway Management Planned: Oral ETT and Video Laryngoscope Planned  Additional Equipment:   Intra-op Plan:   Post-operative Plan: Extubation in OR  Informed Consent: I have reviewed the patients History and Physical, chart, labs and discussed the procedure  including the risks, benefits and alternatives for the proposed anesthesia with the patient or authorized representative who has indicated his/her understanding and acceptance.   Dental advisory given  Plan Discussed with: CRNA  Anesthesia Plan Comments:         Anesthesia Quick Evaluation

## 2017-02-12 NOTE — Progress Notes (Signed)
66 YOF s/p spinal surgery, on vancomycin for surgical prophylaxis, pharmacy to adjust dosage base one renal function. Scr 0.91 on 7/17, est. crcl ~ 50 ml/min. Received pre-op dose 1017m vancomycin at ~ 1130. Pt has a drain.  Plan: Vancomycin 5056mIV Q 12 hrs next dose 2330. BMET Q 72 hrs F/u drain status and LOT  Thanks.   MeMaryanna ShapePharmD, BCPS  Clinical Pharmacist  Pager: [phone]

## 2017-02-12 NOTE — Progress Notes (Signed)
Orthopedic Tech Progress Note Patient Details:  Carmen Reynolds 04-Mar-1951 [id number]  Ortho Devices Type of Ortho Device: Soft collar Ortho Device/Splint Interventions: Application   Carmen Reynolds 02/12/2017, 4:34 PM

## 2017-02-12 NOTE — Anesthesia Procedure Notes (Signed)
Procedure Name: Intubation Date/Time: 02/12/2017 11:33 AM Performed by: Everlean Cherry A Pre-anesthesia Checklist: Patient identified, Emergency Drugs available, Suction available and Patient being monitored Patient Re-evaluated:Patient Re-evaluated prior to induction Oxygen Delivery Method: Circle system utilized Preoxygenation: Pre-oxygenation with 100% oxygen Induction Type: IV induction Ventilation: Mask ventilation without difficulty Laryngoscope Size: Glidescope and 3 Grade View: Grade II Tube type: Oral Tube size: 7.5 mm Number of attempts: 1 Airway Equipment and Method: Rigid stylet and Video-laryngoscopy Placement Confirmation: ETT inserted through vocal cords under direct vision,  positive ETCO2 and breath sounds checked- equal and bilateral Secured at: 22 cm Tube secured with: Tape Dental Injury: Teeth and Oropharynx as per pre-operative assessment  Difficulty Due To: Difficulty was anticipated and Difficult Airway- due to reduced neck mobility

## 2017-02-12 NOTE — Progress Notes (Signed)
Patient ID: Carmen Reynolds, female   DOB: 18-Dec-1950, 66 y.o.   MRN: [id number] Alert, conversant. Good strength BUE. Posterior cervical & shoulder soreness as expected - reassured. JP patent. Incision without erythema or swelling.

## 2017-02-12 NOTE — Brief Op Note (Signed)
02/12/2017  2:02 PM  PATIENT:  Carmen Reynolds  66 y.o. female  PRE-OPERATIVE DIAGNOSIS:  Cervical herniated disc with stenosis, myelopathy, radiculopathy, scoliosis, cervicalgia   POST-OPERATIVE DIAGNOSIS:   Cervical herniated disc with stenosis, myelopathy, radiculopathy, scoliosis, cervicalgia   PROCEDURE:  Procedure(s) with comments: C3-4 C4-5 Anterior cervical decompression/discectomy/fusion/exploration and removal of plate at C5 to C7 (N/A) - C3-4 C4-5 Anterior cervical decompression/discectomy/fusion/exploration and removal of plate at C5 to C7    SURGEON:  Surgeon(s) and Role:    Erline Levine, MD - Primary    * Ditty, Kevan Ny, MD - Assisting  PHYSICIAN ASSISTANT:   ASSISTANTS: Poteat, RN   ANESTHESIA:   general  EBL:  Total I/O In: 1000 [I.V.:1000] Out: 100 [Blood:100]  BLOOD ADMINISTERED:none  DRAINS: (10) Jackson-Pratt drain(s) with closed bulb suction in the prevertebral space   LOCAL MEDICATIONS USED:  MARCAINE    and LIDOCAINE   SPECIMEN:  No Specimen  DISPOSITION OF SPECIMEN:  N/A  COUNTS:  YES  TOURNIQUET:  * No tourniquets in log *  DICTATION: DICTATION: Patient is 66 year old female with bilateral arm pain and weakness with HNP, spondylosis, stenosis radiculopathy C 34 and C 45 levels.  She has scoliosis and had previously undergone ACDF at C 56 and C 67 levels and has cervicalgia.  PROCEDURE: Patient was brought to operating room and following the smooth and uncomplicated induction of general endotracheal anesthesia her head was placed on a horseshoe head holder she was placed in 5 pounds of Holter traction and her anterior neck was prepped and draped in usual sterile fashion. Prior incision was made on the left side of midline after infiltrating the skin and subcutaneous tissues with local lidocaine. The platysmal layer was incised and subplatysmal dissection was performed exposing the anterior border sternocleidomastoid muscle. Using blunt  dissection the carotid sheath was kept lateral and trachea and esophagus kept medial exposing the anterior cervical spine. The previously placed plate was exposed through dense scar tissue and removed.  The interbody prostheses were inspected and were found to be well-healed without evidence of pseudoarthrosis.   A bent spinal needle was placed it was felt to be the C 45 and C 34 levels and this was confirmed on intraoperative x-ray. Longus coli muscles were taken down from the anterior cervical spine using electrocautery and key elevator and self-retaining retractor was placed exposing the C 34 and C 45 levels. The interspaces were incised and a thorough discectomy was performed. Distraction pins were placed. Initially the C 45 level was operated. Uncinate spurs and central spondylitic ridges were drilled down with a high-speed drill. The spinal cord dura and both C 5 nerve roots were widely decompressed. Hemostasis was assured. After trial sizing a 5 mm peek interbody cage was selected and packed with local autograft. This was tamped into position and countersunk appropriately. Attention was the paid to the C 34 level, where similar decompression was performed.  Uncinate spurs and central spondylitic ridges were drilled down with a high-speed drill. The spinal cord dura and both C 4 nerve roots were widely decompressed. Hemostasis was assured. After trial sizing a 4 mm peek interbody cage was selected and packed with local autograft. This was tamped into position and countersunk appropriately.Distraction weight was removed. A 26 mm Aviator anterior cervical plate was affixed to the cervical spine with 12 mm variable-angle screws 2 at C3, 2 at C4 and 2 at C5. All screws were well-positioned and locking mechanisms were engaged. A  final X ray was obtained which showed well positioned grafts and anterior plate without complicating features. Soft tissues were inspected and found to be in good repair. Hemostasis was  assured with Surgifoam and bipolar cautery. The wound was irrigated. A #10 JP drain was inserted through a separate stab incision. The platysma layer was closed with 3-0 Vicryl stitches and the skin was reapproximated with 3-0 Vicryl subcuticular stitches. The wound was dressed with Dermabond. Counts were correct at the end of the case. Patient was extubated and taken to recovery in stable and satisfactory condition.    PLAN OF CARE: Admit to inpatient   PATIENT DISPOSITION:  PACU - hemodynamically stable.   Delay start of Pharmacological VTE agent (>24hrs) due to surgical blood loss or risk of bleeding: yes

## 2017-02-12 NOTE — Anesthesia Postprocedure Evaluation (Signed)
Anesthesia Post Note  Patient: Meredith Mody  Procedure(s) Performed: Procedure(s) (LRB): C3-4 C4-5 Anterior cervical decompression/discectomy/fusion/exploration and removal of plate at C5 to C7 (N/A)     Patient location during evaluation: PACU Anesthesia Type: General Level of consciousness: awake and alert Pain management: pain level controlled Vital Signs Assessment: post-procedure vital signs reviewed and stable Respiratory status: spontaneous breathing, nonlabored ventilation, respiratory function stable and patient connected to nasal cannula oxygen Cardiovascular status: blood pressure returned to baseline and stable Postop Assessment: no signs of nausea or vomiting Anesthetic complications: no    Last Vitals:  Vitals:   02/12/17 1528 02/12/17 1558  BP: 93/67 115/83  Pulse: 77 83  Resp: (!) 7 16  Temp:  36.7 C               Effie Berkshire

## 2017-02-12 NOTE — Transfer of Care (Signed)
Immediate Anesthesia Transfer of Care Note  Patient: Carmen Reynolds  Procedure(s) Performed: Procedure(s) with comments: C3-4 C4-5 Anterior cervical decompression/discectomy/fusion/exploration and removal of plate at C5 to C7 (N/A) - C3-4 C4-5 Anterior cervical decompression/discectomy/fusion/exploration and removal of plate at C5 to C7  Patient Location: PACU  Anesthesia Type:General  Level of Consciousness: awake, alert , oriented and patient cooperative  Airway & Oxygen Therapy: Patient Spontanous Breathing and Patient connected to nasal cannula oxygen  Post-op Assessment: Report given to RN, Post -op Vital signs reviewed and stable and Patient moving all extremities X 4  Post vital signs: Reviewed and stable  Last Vitals:  Vitals:   02/12/17 0925 02/12/17 1355  BP:    Pulse: 97   Resp:    Temp:  37.1 C    Last Pain:  Vitals:   02/12/17 0912  TempSrc:   PainSc: 4       Patients Stated Pain Goal: 4 (17/00/17 4944)  Complications: No apparent anesthesia complications

## 2017-02-13 LAB — BASIC METABOLIC PANEL
ANION GAP: 6 (ref 5–15)
BUN: 10 mg/dL (ref 6–20)
CHLORIDE: 100 mmol/L — AB (ref 101–111)
CO2: 30 mmol/L (ref 22–32)
Calcium: 8.9 mg/dL (ref 8.9–10.3)
Creatinine, Ser: 0.72 mg/dL (ref 0.44–1.00)
GFR calc non Af Amer: >60 mL/min (ref >60)
Glucose, Bld: 138 mg/dL — ABNORMAL HIGH (ref 65–99)
Potassium: 4.6 mmol/L (ref 3.5–5.1)
SODIUM: 136 mmol/L (ref 135–145)

## 2017-02-13 MED ORDER — METHOCARBAMOL 500 MG PO TABS: 500.0000 mg | ORAL_TABLET | Freq: Four times a day (QID) | ORAL | 1 refills | Status: DC | PRN

## 2017-02-13 MED ORDER — HYDROCODONE-ACETAMINOPHEN 5-325 MG PO TABS: 1.0000 | ORAL_TABLET | ORAL | 0 refills | Status: DC | PRN

## 2017-02-13 NOTE — Progress Notes (Signed)
Patient alert and oriented, mae's well, voiding adequate amount of urine, swallowing without difficulty, no c/o pain at time of discharge. Patient discharged home with family. Script and discharged instructions given to patient. Patient and family stated understanding of instructions given. Patient has an appointment with Dr. Vertell Limber.

## 2017-02-13 NOTE — Evaluation (Signed)
Occupational Therapy Evaluation/Discharge Patient Details Name: Carmen Reynolds MRN: [id number] DOB: Jun 02, 1951 Today's Date: 02/13/2017    History of Present Illness Pt is a 66 y.o. female s/p C3-4 C4-5 Anterior cervical decompression/discectomy/fusion/exploration and removal of plate at C5 to C7. She has a PMH significant for essential hypertension, ulcerative colitis, degenerative disc disease, history of lumbar laminectomy.    Clinical Impression   PTA, pt was independent with ADL and functional mobility. Once OT provided education concerning cervical precautions, she was able to complete ADL tasks in hospital setting with modified independence. Pt did require supervision for safety with VC's for technique during bed mobility while utilizing log roll technique. Educated pt concerning compensatory strategies to maximize safety during ADL tasks. All education has been completed and the patient has no further questions. See below for any follow-up Occupational Therapy or equipment needs. OT to sign off. Thank you for this referral.      Follow Up Recommendations  No OT follow up;Supervision - Intermittent    Equipment Recommendations  None recommended by OT    Recommendations for Other Services       Precautions / Restrictions Precautions Precautions: Cervical Precaution Comments: Reviewed cervical precautions in detail related to ADL.  Required Braces or Orthoses: Cervical Brace Cervical Brace: Soft collar Restrictions Weight Bearing Restrictions: No      Mobility Bed Mobility Overal bed mobility: Needs Assistance Bed Mobility: Rolling;Supine to Sit Rolling: Supervision   Supine to sit: Supervision     General bed mobility comments: VC's for safe log roll technique.   Transfers Overall transfer level: Modified independent Equipment used: None             General transfer comment: Increased time but no physical assistance.     Balance Overall balance  assessment: No apparent balance deficits (not formally assessed)                                         ADL either performed or assessed with clinical judgement   ADL Overall ADL's : Modified independent Eating/Feeding: Modified independent   Grooming: Standing;Modified independent   Upper Body Bathing: Modified independent   Lower Body Bathing: Modified independent   Upper Body Dressing : Modified independent   Lower Body Dressing: Modified independent   Toilet Transfer: Ambulation;Modified Independent   Toileting- Clothing Manipulation and Hygiene: Sit to/from stand;Modified independent   Tub/ Shower Transfer: Ambulation;Shower seat;Modified independent   Functional mobility during ADLs: Modified independent General ADL Comments: Pt educated concerning compensatory strategies to participate in ADL while adhering to cervical precautions.      Vision Patient Visual Report: No change from baseline Vision Assessment?: No apparent visual deficits     Perception     Praxis      Pertinent Vitals/Pain Pain Assessment: No/denies pain     Hand Dominance Right   Extremity/Trunk Assessment Upper Extremity Assessment Upper Extremity Assessment: RUE deficits/detail RUE Deficits / Details: Slightly weak as compared to L with 4+/5 overall.    Lower Extremity Assessment Lower Extremity Assessment: Overall WFL for tasks assessed   Cervical / Trunk Assessment Cervical / Trunk Assessment: Other exceptions Cervical / Trunk Exceptions: s/p cervical surgery   Communication Communication Communication: No difficulties   Cognition Arousal/Alertness: Awake/alert Behavior During Therapy: WFL for tasks assessed/performed Overall Cognitive Status: Within Functional Limits for tasks assessed  General Comments       Exercises     Shoulder Instructions      Home Living Family/patient expects to be discharged  to:: Private residence Living Arrangements: Alone Available Help at Discharge: Family;Available PRN/intermittently Type of Home: House Home Access: Stairs to enter [employer] of Steps: 3 Entrance Stairs-Rails: None Home Layout: One level     Bathroom Shower/Tub: Occupational psychologist: Standard     Home Equipment: Shower seat          Prior Functioning/Environment Level of Independence: Independent                 OT Problem List: Pain;Decreased safety awareness;Decreased strength      OT Treatment/Interventions:      OT Goals(Current goals can be found in the care plan section) Acute Rehab OT Goals Patient Stated Goal: to go home at 10 am OT Goal Formulation: With patient Time For Goal Achievement: 02/27/17 Potential to Achieve Goals: Good  OT Frequency:     Barriers to D/C:            Co-evaluation              AM-PAC PT "6 Clicks" Daily Activity     Outcome Measure Help from another person eating meals?: None Help from another person taking care of personal grooming?: None Help from another person toileting, which includes using toliet, bedpan, or urinal?: None Help from another person bathing (including washing, rinsing, drying)?: None Help from another person to put on and taking off regular upper body clothing?: None Help from another person to put on and taking off regular lower body clothing?: None 6 Click Score: 24   End of Session Equipment Utilized During Treatment: Cervical collar (soft collar) Nurse Communication: Mobility status  Activity Tolerance: Patient tolerated treatment well Patient left: in bed;with call bell/phone within reach (sitting at EOB)  OT Visit Diagnosis: Other abnormalities of gait and mobility (R26.89)                Time: 2094-7096 OT Time Calculation (min): 19 min Charges:  OT General Charges $OT Visit: 1 Procedure OT Evaluation $OT Eval Low Complexity: 1 Procedure G-Codes:      Norman Herrlich, MS OTR/L  Pager: [place] A Damyn Weitzel 02/13/2017, 9:34 AM

## 2017-02-13 NOTE — Progress Notes (Signed)
PT Cancellation Note  Patient Details Name: Carmen Reynolds MRN: [id number] DOB: Dec 14, 1950   Cancelled Treatment:    Reason Eval/Treat Not Completed: PT screened, no needs identified, will sign off (pt seen by OT and screened for PT with no needs)   [name] 02/13/2017, 9:01 AM  [name], [place]

## 2017-02-13 NOTE — Care Management Note (Signed)
Case Management Note  Patient Details  Name: Edison Wollschlager MRN: [id number] Date of Birth: 04/16/1951  Subjective/Objective: No CM needs identified by PT/OT eval.                    Action/Plan: CM will sign off for now but will be available should additional discharge needs arise or disposition change.    Expected Discharge Date:  02/13/17               Expected Discharge Plan:  Home/Self Care  In-House Referral:     Discharge planning Services  CM Consult  Post Acute Care Choice:  NA Choice offered to:     DME Arranged:    DME Agency:     HH Arranged:    HH Agency:     Status of Service:  Completed, signed off  If discussed at [employer] of Stay Meetings, dates discussed:    Additional Comments:  Delrae Sawyers, RN 02/13/2017, 10:07 AM

## 2017-02-13 NOTE — Discharge Instructions (Signed)
Wound Care Leave incision open to air. You may shower. Do not scrub directly on incision.  Do not put any creams, lotions, or ointments on incision. Activity Walk each and every day, increasing distance each day. No lifting greater than 5 lbs.  Avoid excessive neck motion. No driving for 2 weeks; may ride as a passenger locally. Wear neck brace at all times except when showering.  If provided soft collar, may wear for comfort unless otherwise instructed. Diet Resume your normal diet.  Return to Work Will be discussed at you follow up appointment. Call Your Doctor If Any of These Occur Redness, drainage, or swelling at the wound.  Temperature greater than 101 degrees. Severe pain not relieved by pain medication. Increased difficulty swallowing. Incision starts to come apart. Follow Up Appt Call today for appointment in 3-4 weeks ([phone]) or for problems.  If you have any hardware placed in your spine, you will need an x-ray before your appointment.

## 2017-02-13 NOTE — Discharge Summary (Signed)
Physician Discharge Summary  Patient ID: Carmen Reynolds MRN: [id number] DOB/AGE: [birth year] 66 y.o.  Admit date: 02/12/2017 Discharge date: 02/13/2017  Admission Diagnoses:Herniated cervical disc with scoliosis and cervicalgia with radiculopathy C 34 and C 45 levels  Discharge Diagnoses: Herniated cervical disc with scoliosis and cervicalgia with radiculopathy C 34 and C 45 levels  Active Problems:   Herniated cervical disc   Discharged Condition: good  Hospital Course: Patient underwent exploration of fusion and anterior cervical discectomy and fusion at C 34 and C 45 levels, from which she did well.  Patient was discharged home on POD 1.  Consults: None  Significant Diagnostic Studies: None  Treatments: surgery:  exploration of fusion and anterior cervical discectomy and fusion at C 34 and C 45 levels  Discharge Exam: Blood pressure 117/81, pulse 65, temperature 97.8 F (36.6 C), temperature source Oral, resp. rate 18, weight 134 lb (60.8 kg), SpO2 98 %. Neurologic: Alert and oriented X 3, normal strength and tone. Normal symmetric reflexes. Normal coordination and gait Wound:CDI  Disposition: Home  Discharge Instructions    Diet - low sodium heart healthy    Complete by:  As directed    Increase activity slowly    Complete by:  As directed      Allergies as of 02/13/2017      Reactions   Meloxicam Other (See Comments)   Intestinal Bleed   Augmentin [amoxicillin-pot Clavulanate] Diarrhea   Lyrica [pregabalin] Swelling   Swelling of feet.   Other Other (See Comments)   Salt-sensitivity Grain-sensitivity Artificial sweetners-sensitivity Sugar-sensitivity   Wheat Bran Other (See Comments)   Wheat-sensitivity   Lactose Intolerance (gi) Other (See Comments)   unknown   Quinapril Other (See Comments)   unknown   Vasotec [enalaprilat] Cough      Medication List    TAKE these medications   buPROPion 150 MG 24 hr tablet Commonly known as:  WELLBUTRIN XL TAKE  1 TABLET EVERY MORNING   busPIRone 30 MG tablet Commonly known as:  BUSPAR TAKE 2 TABLET BY MOUTH ONCE DAILY   CALCIUM 1200 PO Take 2,400 mg by mouth daily.   cholecalciferol 1000 units tablet Commonly known as:  VITAMIN D Take 5,000 Units by mouth 2 (two) times daily.   CITRUCEL 500 MG Tabs Generic drug:  Methylcellulose (Laxative) Take 1,000 mg by mouth 2 (two) times daily.   Co Q 10 100 MG Caps Take 200 mg by mouth 2 (two) times daily. Quol Ultra CoQ10 153m   DULoxetine 60 MG capsule Commonly known as:  CYMBALTA TAKE 1 CAPSULE DAILY FOR   MOOD AND PAIN   esomeprazole 40 MG capsule Commonly known as:  NEXIUM TAKE 1 CAPSULE DAILY   fexofenadine 180 MG tablet Commonly known as:  ALLEGRA Take 180 mg by mouth as needed for allergies.   fluticasone 50 MCG/ACT nasal spray Commonly known as:  FLONASE Place 2 sprays into both nostrils daily.   HYDROcodone-acetaminophen 5-325 MG tablet Commonly known as:  NORCO/VICODIN Take 1 tablet by mouth every 6 (six) hours as needed for moderate pain. What changed:  Another medication with the same name was added. Make sure you understand how and when to take each.   HYDROcodone-acetaminophen 5-325 MG tablet Commonly known as:  NORCO/VICODIN Take 1-2 tablets by mouth every 4 (four) hours as needed for moderate pain. What changed:  You were already taking a medication with the same name, and this prescription was added. Make sure you understand how and when to take  each.   ibandronate 150 MG tablet Commonly known as:  BONIVA Take 150 mg by mouth every 30 (thirty) days. Take in the morning with a full glass of water, on an empty stomach, and do not take anything else by mouth or lie down for the next 30 min.   loperamide 2 MG capsule Commonly known as:  IMODIUM Take 2 mg by mouth as needed for diarrhea or loose stools.   ASACOL HD 800 MG Tbec Generic drug:  Mesalamine Take 1,600 mg by mouth 2 (two) times daily.   mesalamine 1000  MG suppository Commonly known as:  CANASA Place 1 suppository (1,000 mg total) rectally at bedtime.   methocarbamol 500 MG tablet Commonly known as:  ROBAXIN Take 1 tablet (500 mg total) by mouth every 6 (six) hours as needed for muscle spasms.   olmesartan 40 MG tablet Commonly known as:  BENICAR TAKE 1 TABLET DAILY FOR    BLOOD PRESSURE   OVER THE COUNTER MEDICATION Take 300 mg by mouth daily. Mega red 310m   predniSONE 5 MG tablet Commonly known as:  DELTASONE TAKE 1 TABLET 3 TIMES A DAYOR AS DIRECTED What changed:  See the new instructions.   predniSONE 20 MG tablet Commonly known as:  DELTASONE 2 tablets daily for 3 days, 1 tablet daily for 4 days. What changed:  Another medication with the same name was changed. Make sure you understand how and when to take each.   promethazine-dextromethorphan 6.25-15 MG/5ML syrup Commonly known as:  PROMETHAZINE-DM Take 5 mLs by mouth 4 (four) times daily as needed for cough.   Red Yeast Rice Extract 600 MG Tabs Take 1,200 mg by mouth 2 (two) times daily.   solifenacin 10 MG tablet Commonly known as:  VESICARE Take 1 10 mg tablet daily for Bladder   verapamil 240 MG 24 hr capsule Commonly known as:  VERELAN PM Take 1 capsule daily with food   VITAMIN C PO Take 625 mg by mouth daily.        Signed: SPeggyann Shoals MD 02/13/2017, 7:00 AM

## 2017-02-13 NOTE — Progress Notes (Signed)
Subjective: Patient reports doing well.  "My arms are better."  Objective: Vital signs in last 24 hours: Temp:  [97.2 F (36.2 C)-98.7 F (37.1 C)] 97.8 F (36.6 C) (07/21 0300) Pulse Rate:  [65-112] 65 (07/21 0300) Resp:  [7-23] 18 (07/21 0300) BP: (93-132)/(61-83) 117/81 (07/21 0300) SpO2:  [94 %-99 %] 98 % (07/21 0300) Weight:  [134 lb (60.8 kg)] 134 lb (60.8 kg) (07/20 0859)  Intake/Output from previous day: 07/20 0730 - 07/21 0729 In: 1540 [P.O.:440; I.V.:1000; IV Piggyback:100] Out: 645 [Urine:500; Drains:45; Blood:100] Intake/Output this shift: Total I/O In: 1540 [P.O.:440; I.V.:1000; IV Piggyback:100] Out: 645 [Urine:500; Drains:45; Blood:100]  Physical Exam: Strength full all groups.  Dressing CDI.  No hoarseness or dysphagia.  Lab Results: No results for input(s): WBC, HGB, HCT, PLT in the last 72 hours. BMET  Recent Labs  02/13/17 0410  NA 136  K 4.6  CL 100*  CO2 30  GLUCOSE 138*  BUN 10  CREATININE 0.72  CALCIUM 8.9    Studies/Results: Dg Cervical Spine 2 Or 3 Views  Result Date: 02/12/2017 CLINICAL DATA:  Cervical decompression. EXAM: CERVICAL SPINE - 2-3 VIEW COMPARISON:  MRI 12/18/2016 . FINDINGS: Postsurgical changes cervical spine with metallic markers noted anteriorly at the C3-C4 and C4-C5 levels on image number 1. On image number 2 there has been C3 through C5 anterior and interbody fusion. Hardware intact. Anatomic alignment . Surgical sponges noted. IMPRESSION: Postsurgical changes cervical spine. Electronically Signed   By: Marcello Moores  Register   On: 02/12/2017 13:36    Assessment/Plan: Patient is doing well.  Discharge home.    LOS: 1 day    Peggyann Shoals, MD 02/13/2017, 6:58 AM

## 2017-02-15 ENCOUNTER — Encounter (HOSPITAL_COMMUNITY): Payer: Self-pay | Admitting: Neurosurgery

## 2017-02-15 ENCOUNTER — Encounter: Payer: Self-pay | Admitting: Internal Medicine

## 2017-02-15 MED FILL — Thrombin For Soln 5000 Unit: CUTANEOUS | Qty: 1 | Status: AC

## 2017-02-16 ENCOUNTER — Encounter (HOSPITAL_COMMUNITY): Payer: Self-pay | Admitting: Neurosurgery

## 2017-02-19 ENCOUNTER — Encounter: Payer: Self-pay | Admitting: Internal Medicine

## 2017-02-22 ENCOUNTER — Encounter: Payer: Self-pay | Admitting: Physician Assistant

## 2017-03-01 ENCOUNTER — Other Ambulatory Visit: Payer: Self-pay | Admitting: Physician Assistant

## 2017-03-01 MED ORDER — VERAPAMIL HCL 120 MG PO TABS: 120.0000 mg | ORAL_TABLET | Freq: Three times a day (TID) | ORAL | 0 refills | Status: DC

## 2017-03-02 ENCOUNTER — Other Ambulatory Visit: Payer: Self-pay | Admitting: *Deleted

## 2017-03-02 MED ORDER — VERAPAMIL HCL ER 240 MG PO TBCR: 240.0000 mg | EXTENDED_RELEASE_TABLET | Freq: Every day | ORAL | 1 refills | Status: DC

## 2017-03-04 ENCOUNTER — Other Ambulatory Visit: Payer: Self-pay | Admitting: *Deleted

## 2017-03-04 MED ORDER — VERAPAMIL HCL ER 240 MG PO CP24: 240.0000 mg | ORAL_CAPSULE | Freq: Every day | ORAL | 1 refills | Status: DC

## 2017-03-16 ENCOUNTER — Encounter: Payer: Self-pay | Admitting: Internal Medicine

## 2017-03-16 ENCOUNTER — Ambulatory Visit (INDEPENDENT_AMBULATORY_CARE_PROVIDER_SITE_OTHER): Payer: BLUE CROSS/BLUE SHIELD | Admitting: Internal Medicine

## 2017-03-16 VITALS — BP 124/68 | HR 68 | Temp 97.3°F | Resp 18 | Ht 64.0 in | Wt 131.4 lb

## 2017-03-16 DIAGNOSIS — K51819 Other ulcerative colitis with unspecified complications: Secondary | ICD-10-CM | POA: Diagnosis not present

## 2017-03-16 DIAGNOSIS — N182 Chronic kidney disease, stage 2 (mild): Secondary | ICD-10-CM | POA: Diagnosis not present

## 2017-03-16 DIAGNOSIS — Z136 Encounter for screening for cardiovascular disorders: Secondary | ICD-10-CM

## 2017-03-16 DIAGNOSIS — R7303 Prediabetes: Secondary | ICD-10-CM

## 2017-03-16 DIAGNOSIS — I10 Essential (primary) hypertension: Secondary | ICD-10-CM | POA: Diagnosis not present

## 2017-03-16 DIAGNOSIS — E559 Vitamin D deficiency, unspecified: Secondary | ICD-10-CM | POA: Diagnosis not present

## 2017-03-16 DIAGNOSIS — Z0001 Encounter for general adult medical examination with abnormal findings: Secondary | ICD-10-CM

## 2017-03-16 DIAGNOSIS — K219 Gastro-esophageal reflux disease without esophagitis: Secondary | ICD-10-CM | POA: Diagnosis not present

## 2017-03-16 DIAGNOSIS — Z79899 Other long term (current) drug therapy: Secondary | ICD-10-CM | POA: Diagnosis not present

## 2017-03-16 DIAGNOSIS — D6851 Activated protein C resistance: Secondary | ICD-10-CM

## 2017-03-16 DIAGNOSIS — E782 Mixed hyperlipidemia: Secondary | ICD-10-CM | POA: Diagnosis not present

## 2017-03-16 DIAGNOSIS — Z1212 Encounter for screening for malignant neoplasm of rectum: Secondary | ICD-10-CM

## 2017-03-16 DIAGNOSIS — R5383 Other fatigue: Secondary | ICD-10-CM | POA: Diagnosis not present

## 2017-03-16 DIAGNOSIS — Z1211 Encounter for screening for malignant neoplasm of colon: Secondary | ICD-10-CM | POA: Diagnosis not present

## 2017-03-16 NOTE — Patient Instructions (Signed)

## 2017-03-16 NOTE — Progress Notes (Signed)
[facility] Unk Pinto, M.D.      Uvaldo Bristle. Silverio Lay, P.A.-C [facility]                [address] [place], [place] [address] Telephone [phone] Telefax [phone]  Annual Screening/Preventative Visit & Comprehensive Evaluation &  Examination     This very nice 66 y.o. Separated WF presents for a Screening/Preventative Visit & comprehensive evaluation and management of multiple medical co-morbidities.  Patient has been followed for HTN, Prediabetes, Hyperlipidemia and Vitamin D Deficiency.  Patient also has GERD controlled with meds. She has hx/o UC followed by Dr  Collene Mares (Patient admits that she is over due her f/u colonoscopy).      Patient has DDD and has underdone Lumbar fusion in and more recrently in May had ACD/Cx Fusion by Dr Vertell Limber.       HTN predates since 1997. Patient's BP has been controlled at home and patient denies any cardiac symptoms as chest pain, palpitations, shortness of breath, dizziness or ankle swelling. Today's BP is at goal - 124/68.      Patient's hyperlipidemia is controlled with diet and medications. Patient denies myalgias or other medication SE's. Last lipids were at goal with very high HDL of 127 and low LDL 78 with sl elevated Trig's: Lab Results  Component Value Date   CHOL 243 (H) 11/12/2016   HDL 127 11/12/2016   LDLCALC 78 11/12/2016   TRIG 189 (H) 11/12/2016   CHOLHDL 1.9 11/12/2016      Patient has prediabetes (A1c 5.7% in 2013) and patient denies reactive hypoglycemic symptoms, visual blurring, diabetic polys, or paresthesias. Last A1c was at goal: Lab Results  Component Value Date   HGBA1C 5.2 08/05/2016      Finally, patient has history of Vitamin D Deficiency ("28" in 2008) and last Vitamin D was at goal: Lab Results  Component Value Date   VD25OH 92 11/12/2016   Current Outpatient Prescriptions on File Prior to Visit  Medication Sig  .  Ascorbic Acid (VITAMIN C PO) Take 625 mg by mouth daily.  [name] buPROPion (WELLBUTRIN XL) 150 MG 24 hr tablet TAKE 1 TABLET EVERY MORNING  . busPIRone (BUSPAR) 30 MG tablet TAKE 2 TABLET BY MOUTH ONCE DAILY  . Calcium Carbonate-Vit D-Min (CALCIUM 1200 PO) Take 2,400 mg by mouth daily.  . cholecalciferol (VITAMIN D) 1000 UNITS tablet Take 5,000 Units by mouth 2 (two) times daily.   . Coenzyme Q10 (CO Q 10) 100 MG CAPS Take 200 mg by mouth 2 (two) times daily. Quol Ultra CoQ10 127m   . DULoxetine (CYMBALTA) 60 MG capsule TAKE 1 CAPSULE DAILY FOR   MOOD AND PAIN  . esomeprazole (NEXIUM) 40 MG capsule TAKE 1 CAPSULE DAILY  . fexofenadine (ALLEGRA) 180 MG tablet Take 180 mg by mouth as needed for allergies.   .Marland KitchenHYDROcodone-acetaminophen (NORCO/VICODIN) 5-325 MG tablet Take 1 tablet by mouth every 6 (six) hours as needed for moderate pain.  .Marland Kitchenibandronate (BONIVA) 150 MG tablet Take 150 mg by mouth every 30 (thirty) days. Take in the morning with a full glass of water, on an empty stomach, and do not take anything else by mouth or lie down for the next 30 min.  .Marland Kitchenloperamide (IMODIUM) 2 MG capsule Take 2 mg by mouth as needed for diarrhea or loose stools.  . Mesalamine (ASACOL HD) 800  MG TBEC Take 1,600 mg by mouth 2 (two) times daily.  . mesalamine (CANASA) 1000 MG suppository Place 1 suppository (1,000 mg total) rectally at bedtime.  . methocarbamol (ROBAXIN) 500 MG tablet Take 1 tablet (500 mg total) by mouth every 6 (six) hours as needed for muscle spasms.  . Methylcellulose, Laxative, (CITRUCEL) 500 MG TABS Take 1,000 mg by mouth 2 (two) times daily.   [name] olmesartan (BENICAR) 40 MG tablet TAKE 1 TABLET DAILY FOR    BLOOD PRESSURE  . OVER THE COUNTER MEDICATION Take 300 mg by mouth daily. Mega red 318m  . predniSONE (DELTASONE) 5 MG tablet TAKE 1 TABLET 3 TIMES A DAYOR AS DIRECTED (Patient taking differently: TAKE 1 TABLET BY MOUTH DAILY AS NEEDED)  . Red Yeast Rice Extract 600 MG TABS Take 1,200 mg by  mouth 2 (two) times daily.  . solifenacin (VESICARE) 10 MG tablet Take 1 10 mg tablet daily for Bladder  . verapamil (VERELAN PM) 240 MG 24 hr capsule Take 1 capsule (240 mg total) by mouth at bedtime.   No current facility-administered medications on file prior to visit.    Allergies  Allergen Reactions  . Meloxicam Other (See Comments)    Intestinal Bleed  . Augmentin [Amoxicillin-Pot Clavulanate] Diarrhea  . Lyrica [Pregabalin] Swelling    Swelling of feet.  . Other Other (See Comments)    Salt-sensitivity Grain-sensitivity Artificial sweetners-sensitivity Sugar-sensitivity  . Wheat Bran Other (See Comments)    Wheat-sensitivity  . Lactose Intolerance (Gi) Other (See Comments)    unknown  . Quinapril Other (See Comments)    unknown  . Vasotec [Enalaprilat] Cough   Past Medical History:  Diagnosis Date  . Adult celiac disease   . Concussion    fell jumping off of horse in past 6 months- 09/14/12  . DDD (degenerative disc disease)   . Diverticulitis   . DJD (degenerative joint disease)   . Factor V Leiden ([place]   . Fibromyalgia   . Fusion of spine of lumbar region t  . GERD (gastroesophageal reflux disease)   . History of fusion of cervical spine t  . Hyperlipidemia   . Hypertension   . Lactose intolerance   . Proctitis   . Scoliosis   . Spinal stenosis   . Ulcerative colitis ([place]   . Vitamin D deficiency    Health Maintenance  Topic Date Due  . Hepatitis C Screening  [birth year] . PNA vac Low Risk Adult (1 of 2 - PCV13) 01/18/2014  . INFLUENZA VACCINE  02/24/2017  . COLON CANCER SCREENING ANNUAL FOBT  11/12/2017  . MAMMOGRAM  02/05/2019  . TETANUS/TDAP  01/21/2026  . DEXA SCAN  Completed   Immunization History  Administered Date(s) Administered  . Influenza-Unspecified 04/26/2014  . Pneumococcal Polysaccharide-23 09/26/2012  . Td 01/22/2016   Past Surgical History:  Procedure Laterality Date  . ANTERIOR CERVICAL DECOMP/DISCECTOMY FUSION N/A  02/12/2017   Procedure: C3-4 C4-5 Anterior cervical decompression/discectomy/fusion/exploration and removal of plate at C5 to C7;  Surgeon: SErline Levine MD;  [facility]  Service: Neurosurgery;  Laterality: N/A;  C3-4 C4-5 Anterior cervical decompression/discectomy/fusion/exploration and removal of plate at C5 to C7  . ANTERIOR LATERAL LUMBAR FUSION 4 LEVELS N/A 09/22/2012   Procedure: ANTERIOR LATERAL LUMBAR FUSION 4 LEVELS;  Surgeon: JErline Levine MD;  [facility];  Service: Neurosurgery;  Laterality: N/A;  Thoracic Twelve Lumbar One,Lumbar One-TwoAnterolateral decompression/fusion/percutaneous percutaneous pedicle screws   . BACK SURGERY  2006, 2008   .  disectomy  . CHEST TUBE INSERTION Left 09/22/2012   Procedure: CHEST TUBE INSERTION;  Surgeon: Erline Levine, MD;  Location: [facility];  Service: Neurosurgery;  Laterality: Left;  Insertion per Dr. Cyndia Bent  . CHOLECYSTECTOMY N/A 11/22/2013   Procedure: LAPAROSCOPIC CHOLECYSTECTOMY ;  Surgeon: Harl Bowie, MD;  Location: [facility];  Service: General;  Laterality: N/A;  . EYE SURGERY    . FOOT SURGERY Right 1978   Reconstruction after faliure of emergency stitching  . LASIK    . NECK SURGERY  2002   anterior cervical fusion  . TUBAL LIGATION  1983   Family History  Problem Relation Age of Onset  . Prostate cancer Father   . Cancer Father        skin cancer, prostate cancer with non-hodkins lymphoma  . Heart attack Brother 65       CABG  . Hypertension Brother   . Heart disease Brother   . Heart failure Mother   . Stroke Mother   . Hypertension Mother   . Heart attack Mother   . Heart attack Brother 49   Social History  Substance Use Topics  . Smoking status: Former Smoker    Packs/day: 1.00    Years: 24.00    Types: Cigarettes    Quit date: 07/28/1990  . Smokeless tobacco: Never Used  . Alcohol use 4.2 oz/week    7 Glasses of wine per week    ROS Constitutional: Denies fever, chills, weight loss/gain,  headaches, insomnia,  night sweats, and change in appetite. Does c/o fatigue. Eyes: Denies redness, blurred vision, diplopia, discharge, itchy, watery eyes.  ENT: Denies discharge, congestion, post nasal drip, epistaxis, sore throat, earache, hearing loss, dental pain, Tinnitus, Vertigo, Sinus pain, snoring.  Cardio: Denies chest pain, palpitations, irregular heartbeat, syncope, dyspnea, diaphoresis, orthopnea, PND, claudication, edema Respiratory: denies cough, dyspnea, DOE, pleurisy, hoarseness, laryngitis, wheezing.  Gastrointestinal: Denies dysphagia, heartburn, reflux, water brash, pain, cramps, nausea, vomiting, bloating, diarrhea, constipation, hematemesis, melena, hematochezia, jaundice, hemorrhoids Genitourinary: Denies dysuria, frequency, urgency, nocturia, hesitancy, discharge, hematuria, flank pain Breast: Breast lumps, nipple discharge, bleeding.  Musculoskeletal: Denies arthralgia, myalgia, stiffness, Jt. Swelling, pain, limp, and strain/sprain. Denies falls. Skin: Denies puritis, rash, hives, warts, acne, eczema, changing in skin lesion Neuro: No weakness, tremor, incoordination, spasms, paresthesia, pain Psychiatric: Denies confusion, memory loss, sensory loss. Denies Depression. Endocrine: Denies change in weight, skin, hair change, nocturia, and paresthesia, diabetic polys, visual blurring, hyper / hypo glycemic episodes.  Heme/Lymph: No excessive bleeding, bruising, enlarged lymph nodes.  Physical Exam  BP 124/68   Pulse 68   Temp (!) 97.3 F (36.3 C)   Resp 18   Ht 5' 4"  (1.626 m)   Wt 131 lb 6.4 oz (59.6 kg)   BMI 22.55 kg/m   General Appearance: Well nourished, well groomed and in no apparent distress.  Eyes: PERRLA, EOMs, conjunctiva no swelling or erythema, normal fundi and vessels. Sinuses: No frontal/maxillary tenderness ENT/Mouth: EACs patent / TMs  nl. Nares clear without erythema, swelling, mucoid exudates. Oral hygiene is good. No erythema, swelling, or  exudate. Tongue normal, non-obstructing. Tonsils not swollen or erythematous. Hearing normal.  Neck: Supple, thyroid normal. No bruits, nodes or JVD. Respiratory: Respiratory effort normal.  BS equal and clear bilateral without rales, rhonci, wheezing or stridor. Cardio: Heart sounds are normal with regular rate and rhythm and no murmurs, rubs or gallops. Peripheral pulses are normal and equal bilaterally without edema. No aortic or femoral bruits. Chest: symmetric with normal  excursions and percussion. Breasts: done recently by GYN Abdomen: Flat, soft with bowel sounds active. Nontender, no guarding, rebound, hernias, masses, or organomegaly.  Lymphatics: Non tender without lymphadenopathy.  Genitourinary: done recently by GYN Musculoskeletal: Full ROM all peripheral extremities, joint stability, 5/5 strength, and normal gait. Skin: Warm and dry without rashes, lesions, cyanosis, clubbing or  ecchymosis.  Neuro: Cranial nerves intact, reflexes equal bilaterally. Normal muscle tone, no cerebellar symptoms. Sensation intact.  Pysch: Alert and oriented X 3, normal affect, Insight and Judgment appropriate.   Assessment and Plan  1. Annual Preventative Screening Examination  2. Essential hypertension  - EKG 12-Lead - Urinalysis, Routine w reflex microscopic - Microalbumin / creatinine urine ratio - CBC with Differential/Platelet - BASIC METABOLIC PANEL WITH GFR - Magnesium - TSH  3. Hyperlipidemia, mixed  - EKG 12-Lead - Hepatic function panel - Lipid panel - TSH  4. Prediabetes  - EKG 12-Lead - Hemoglobin A1c - Insulin, fasting  5. Vitamin D deficiency  - VITAMIN D 25 Hydroxy  6. Gastroesophageal reflux disease   7. CKD2 (GFR 77 ml/min)   - BASIC METABOLIC PANEL WITH GFR  8. Factor 5 Leiden mutation, heterozygous ([place])  9. Other ulcerative colitis with complication ([place])  - POC Hemoccult Bld/Stl  10. Screening for ischemic heart disease  - EKG 12-Lead  11.  Fatigue  - Vitamin B12 - Iron,Total/Total Iron Binding Cap - CBC with Differential/Platelet  12. Screening for colorectal cancer  - POC Hemoccult Bld/Stl  13. Medication management  - Urinalysis, Routine w reflex microscopic - Microalbumin / creatinine urine ratio - CBC with Differential/Platelet - BASIC METABOLIC PANEL WITH GFR - Hepatic function panel - Magnesium - Lipid panel - TSH - Hemoglobin A1c - Insulin, fasting - VITAMIN D 25 Hydroxy        Patient was counseled in prudent diet to achieve/maintain BMI less than 25 for weight control, BP monitoring, regular exercise and medications. Discussed med's effects and SE's. Screening labs and tests as requested with regular follow-up as recommended. Over 40 minutes of exam, counseling, chart review and high complex critical decision making was performed.

## 2017-03-17 LAB — HEPATIC FUNCTION PANEL
AG RATIO: 1.9 (calc) (ref 1.0–2.5)
ALKALINE PHOSPHATASE (APISO): 52 U/L (ref 33–130)
ALT: 7 U/L (ref 6–29)
AST: 13 U/L (ref 10–35)
Albumin: 3.7 g/dL (ref 3.6–5.1)
BILIRUBIN DIRECT: 0.1 mg/dL (ref 0.0–0.2)
BILIRUBIN INDIRECT: 0.3 mg/dL (ref 0.2–1.2)
BILIRUBIN TOTAL: 0.4 mg/dL (ref 0.2–1.2)
Globulin: 2 g/dL (calc) (ref 1.9–3.7)
Total Protein: 5.7 g/dL — ABNORMAL LOW (ref 6.1–8.1)

## 2017-03-17 LAB — LIPID PANEL
CHOL/HDL RATIO: 2.5 (calc) (ref <5.0)
Cholesterol: 198 mg/dL (ref <200)
HDL: 79 mg/dL (ref >50)
LDL Cholesterol (Calc): 88 mg/dL (calc)
NON-HDL CHOLESTEROL (CALC): 119 mg/dL (ref <130)
Triglycerides: 217 mg/dL — ABNORMAL HIGH (ref <150)

## 2017-03-17 LAB — CBC WITH DIFFERENTIAL/PLATELET
BASOS ABS: 52 {cells}/uL (ref 0–200)
Basophils Relative: 0.5 %
EOS ABS: 177 {cells}/uL (ref 15–500)
Eosinophils Relative: 1.7 %
HEMATOCRIT: 33.8 % — AB (ref 35.0–45.0)
HEMOGLOBIN: 11.2 g/dL — AB (ref 11.7–15.5)
LYMPHS ABS: 2704 {cells}/uL (ref 850–3900)
MCH: 30.4 pg (ref 27.0–33.0)
MCHC: 33.1 g/dL (ref 32.0–36.0)
MCV: 91.8 fL (ref 80.0–100.0)
MPV: 9.4 fL (ref 7.5–12.5)
Monocytes Relative: 11.1 %
NEUTROS ABS: 6313 {cells}/uL (ref 1500–7800)
NEUTROS PCT: 60.7 %
Platelets: 308 10*3/uL (ref 140–400)
RBC: 3.68 10*6/uL — ABNORMAL LOW (ref 3.80–5.10)
RDW: 13 % (ref 11.0–15.0)
Total Lymphocyte: 26 %
WBC mixed population: 1154 cells/uL — ABNORMAL HIGH (ref 200–950)
WBC: 10.4 10*3/uL (ref 3.8–10.8)

## 2017-03-17 LAB — BASIC METABOLIC PANEL WITH GFR
BUN: 12 mg/dL (ref 7–25)
CO2: 28 mmol/L (ref 20–32)
CREATININE: 0.84 mg/dL (ref 0.50–0.99)
Calcium: 9.2 mg/dL (ref 8.6–10.4)
Chloride: 99 mmol/L (ref 98–110)
GFR, EST AFRICAN AMERICAN: 83 mL/min/{1.73_m2} (ref >=60)
GFR, EST NON AFRICAN AMERICAN: 71 mL/min/{1.73_m2} (ref >=60)
Glucose, Bld: 85 mg/dL (ref 65–99)
POTASSIUM: 4.2 mmol/L (ref 3.5–5.3)
SODIUM: 137 mmol/L (ref 135–146)

## 2017-03-17 LAB — URINALYSIS, ROUTINE W REFLEX MICROSCOPIC
Bilirubin Urine: NEGATIVE
GLUCOSE, UA: NEGATIVE
HGB URINE DIPSTICK: NEGATIVE
Ketones, ur: NEGATIVE
LEUKOCYTES UA: NEGATIVE
NITRITE: NEGATIVE
PH: 6 (ref 5.0–8.0)
PROTEIN: NEGATIVE
Specific Gravity, Urine: 1.008 (ref 1.001–1.03)

## 2017-03-17 LAB — HEMOGLOBIN A1C
Hgb A1c MFr Bld: 5.3 % of total Hgb (ref <5.7)
MEAN PLASMA GLUCOSE: 105 (calc)
eAG (mmol/L): 5.8 (calc)

## 2017-03-17 LAB — URINE CULTURE
MICRO NUMBER: 80909186
RESULT: NO GROWTH
SPECIMEN QUALITY:: ADEQUATE

## 2017-03-17 LAB — MAGNESIUM: Magnesium: 1.7 mg/dL (ref 1.5–2.5)

## 2017-03-17 LAB — MICROALBUMIN / CREATININE URINE RATIO: Creatinine, Urine: 29 mg/dL (ref 20–320)

## 2017-03-17 LAB — VITAMIN D 25 HYDROXY (VIT D DEFICIENCY, FRACTURES): Vit D, 25-Hydroxy: 109 ng/mL — ABNORMAL HIGH (ref 30–100)

## 2017-03-17 LAB — IRON, TOTAL/TOTAL IRON BINDING CAP
%SAT: 33 % (ref 11–50)
Iron: 76 ug/dL (ref 45–160)
TIBC: 229 ug/dL — AB (ref 250–450)

## 2017-03-17 LAB — VITAMIN B12: VITAMIN B 12: 1033 pg/mL (ref 200–1100)

## 2017-03-17 LAB — TSH: TSH: 2.15 m[IU]/L (ref 0.40–4.50)

## 2017-03-17 LAB — INSULIN, FASTING: Insulin: 2.2 u[IU]/mL (ref 2.0–19.6)

## 2017-04-15 ENCOUNTER — Encounter: Payer: Self-pay | Admitting: Physician Assistant

## 2017-04-23 ENCOUNTER — Other Ambulatory Visit: Payer: Self-pay | Admitting: Internal Medicine

## 2017-04-23 DIAGNOSIS — N3281 Overactive bladder: Secondary | ICD-10-CM

## 2017-04-30 ENCOUNTER — Encounter: Payer: Self-pay | Admitting: Internal Medicine

## 2017-04-30 ENCOUNTER — Other Ambulatory Visit: Payer: Self-pay | Admitting: Internal Medicine

## 2017-04-30 MED ORDER — OLMESARTAN MEDOXOMIL 40 MG PO TABS: ORAL_TABLET | ORAL | 3 refills | Status: DC

## 2017-05-25 ENCOUNTER — Encounter: Payer: Self-pay | Admitting: Physician Assistant

## 2017-05-26 ENCOUNTER — Other Ambulatory Visit: Payer: Self-pay | Admitting: Physician Assistant

## 2017-05-26 DIAGNOSIS — D229 Melanocytic nevi, unspecified: Secondary | ICD-10-CM

## 2017-06-11 LAB — HM COLONOSCOPY

## 2017-06-20 NOTE — Progress Notes (Signed)
FOLLOW UP  Assessment and Plan:   Hypertension Well controlled with current medications Monitor blood pressure at home; patient to call if consistently greater than 130/80 Continue DASH diet.   Reminder to go to the ER if any CP, SOB, nausea, dizziness, severe HA, changes vision/speech, left arm numbness and tingling and jaw pain.  Cholesterol Currently well managed by lifestyle and red rice yeast and fiber supplement Continue low cholesterol diet and exercise.  Check lipid panel.   Abnormal glucose Previously prediabetic; recent A1Cs normalized but with continued elevated glucose - continue to monitor closely Continue diet and exercise.  Perform daily foot/skin check, notify office of any concerning changes.  Check A1C  GERD Well managed on current medications Discussed diet, avoiding triggers and other lifestyle changes  Vitamin D Def/ osteoporosis prevention Continue supplementation Check Vit D level  Continue diet and meds as discussed. Further disposition pending results of labs. Discussed med's effects and SE's.   Over 30 minutes of exam, counseling, chart review, and critical decision making was performed.   Future Appointments  Date Time Provider [place]  09/30/2017  2:30 PM Unk Pinto, MD [facility] None  04/22/2018 10:00 AM Unk Pinto, MD [facility] None    ----------------------------------------------------------------------------------------------------------------------  HPI 66 y.o. female  presents for 3 month follow up on hypertension, cholesterol, abnormal glucose, GERD and vitamin D deficiency. She is recovering from an anterior cervical fusion in July of this year, reports she is doing fairly though is having to ease back into exercise routine. She also had a colonoscopy done 11/19 - reports the prep has disrupted her bowel pattern (ulcerative colitis) and she is experiencing more diarrhea/loose stools than usual - she denies any  interventions today. She reports increased stress related to recent divorce and having to arrange affairs - she is followed by psychiatry for management.   she has a diagnosis of GERD which is currently managed by nexium 40 mg  she reports symptoms is currently well controlled, and denies breakthrough reflux, burning in chest, hoarseness or cough.    Her blood pressure has been controlled at home, today their BP is BP: 110/72  She does workout. She denies chest pain, shortness of breath, dizziness.   She is on cholesterol medication and denies myalgias. Her cholesterol is not at goal. The cholesterol last visit was:   Lab Results  Component Value Date   CHOL 198 03/16/2017   HDL 79 03/16/2017   LDLCALC 78 11/12/2016   TRIG 217 (H) 03/16/2017   CHOLHDL 2.5 03/16/2017    She has been working on diet and exercise for prediabetes, and denies increased appetite, nausea, paresthesia of the feet, polydipsia and polyuria. Last A1C in the office was:  Lab Results  Component Value Date   HGBA1C 5.3 03/16/2017   Patient is on Vitamin D supplement and above goal:   Lab Results  Component Value Date   VD25OH 109 (H) 03/16/2017        Current Medications:  Current Outpatient Medications on File Prior to Visit  Medication Sig  . Ascorbic Acid (VITAMIN C PO) Take 625 mg by mouth daily.  [name] buPROPion (WELLBUTRIN XL) 150 MG 24 hr tablet TAKE 1 TABLET EVERY MORNING  . busPIRone (BUSPAR) 30 MG tablet TAKE 2 TABLET BY MOUTH ONCE DAILY  . Calcium Carbonate-Vit D-Min (CALCIUM 1200 PO) Take 2,400 mg by mouth daily.  . cholecalciferol (VITAMIN D) 1000 UNITS tablet Take 5,000 Units by mouth 2 (two) times daily.   . Coenzyme Q10 (  CO Q 10) 100 MG CAPS Take 200 mg by mouth 2 (two) times daily. Quol Ultra CoQ10 143m   . DULoxetine (CYMBALTA) 60 MG capsule TAKE 1 CAPSULE DAILY FOR   MOOD AND PAIN  . esomeprazole (NEXIUM) 40 MG capsule TAKE 1 CAPSULE DAILY  . fexofenadine (ALLEGRA) 180 MG tablet Take 180 mg  by mouth as needed for allergies.   .Marland KitchenHYDROcodone-acetaminophen (NORCO/VICODIN) 5-325 MG tablet Take 1 tablet by mouth every 6 (six) hours as needed for moderate pain.  .Marland Kitchenibandronate (BONIVA) 150 MG tablet Take 150 mg by mouth every 30 (thirty) days. Take in the morning with a full glass of water, on an empty stomach, and do not take anything else by mouth or lie down for the next 30 min.  .Marland Kitchenloperamide (IMODIUM) 2 MG capsule Take 2 mg by mouth as needed for diarrhea or loose stools.  . Mesalamine (ASACOL HD) 800 MG TBEC Take 1,600 mg by mouth 2 (two) times daily.  . mesalamine (CANASA) 1000 MG suppository Place 1 suppository (1,000 mg total) rectally at bedtime.  . methocarbamol (ROBAXIN) 500 MG tablet Take 1 tablet (500 mg total) by mouth every 6 (six) hours as needed for muscle spasms.  . Methylcellulose, Laxative, (CITRUCEL) 500 MG TABS Take 1,000 mg by mouth 2 (two) times daily.   .Marland Kitchenolmesartan (BENICAR) 40 MG tablet Take 1 tablet daily for BP  . OVER THE COUNTER MEDICATION Take 300 mg by mouth daily. Mega red 3018m . predniSONE (DELTASONE) 5 MG tablet TAKE 1 TABLET 3 TIMES A DAYOR AS DIRECTED (Patient taking differently: TAKE 1 TABLET BY MOUTH DAILY AS NEEDED)  . Red Yeast Rice Extract 600 MG TABS Take 1,200 mg by mouth 2 (two) times daily.  . verapamil (VERELAN PM) 240 MG 24 hr capsule Take 1 capsule (240 mg total) by mouth at bedtime.  . VESICARE 10 MG tablet TAKE 1/2 TO 1 TABLET DAILY FOR BLADDER   No current facility-administered medications on file prior to visit.      Allergies:  Allergies  Allergen Reactions  . Meloxicam Other (See Comments)    Intestinal Bleed  . Augmentin [Amoxicillin-Pot Clavulanate] Diarrhea  . Lyrica [Pregabalin] Swelling    Swelling of feet.  . Other Other (See Comments)    Salt-sensitivity Grain-sensitivity Artificial sweetners-sensitivity Sugar-sensitivity  . Wheat Bran Other (See Comments)    Wheat-sensitivity  . Lactose Intolerance (Gi) Other  (See Comments)    unknown  . Quinapril Other (See Comments)    unknown  . Vasotec [Enalaprilat] Cough     Medical History:  Past Medical History:  Diagnosis Date  . Adult celiac disease   . Concussion    fell jumping off of horse in past 6 months- 09/14/12  . DDD (degenerative disc disease)   . Diverticulitis   . DJD (degenerative joint disease)   . Factor V Leiden ([place]  . Fibromyalgia   . Fusion of spine of lumbar region t  . GERD (gastroesophageal reflux disease)   . History of fusion of cervical spine t  . Hyperlipidemia   . Hypertension   . Lactose intolerance   . Proctitis   . Scoliosis   . Spinal stenosis   . Ulcerative colitis ([place]  . Vitamin D deficiency    Family history- Reviewed and unchanged Social history- Reviewed and unchanged   Review of Systems:  Review of Systems  Constitutional: Negative for malaise/fatigue and weight loss.  HENT: Negative for hearing loss and  tinnitus.   Eyes: Negative for blurred vision and double vision.  Respiratory: Negative for cough, shortness of breath and wheezing.   Cardiovascular: Negative for chest pain, palpitations, orthopnea, claudication and leg swelling.  Gastrointestinal: Positive for diarrhea (Intermittent for 2 weeks after colonoscopy for UC). Negative for abdominal pain, blood in stool, constipation, heartburn, melena, nausea and vomiting.  Genitourinary: Negative.   Musculoskeletal: Negative for joint pain and myalgias.  Skin: Negative for rash.  Neurological: Negative for dizziness, tingling, sensory change, weakness and headaches.  Endo/Heme/Allergies: Negative for polydipsia.  Psychiatric/Behavioral: Negative for suicidal ideas. The patient is nervous/anxious.   All other systems reviewed and are negative.     Physical Exam: BP 110/72   Pulse 93   Temp (!) 97.3 F (36.3 C)   Ht 5' 4"  (1.626 m)   Wt 121 lb (54.9 kg)   SpO2 99%   BMI 20.77 kg/m  Wt Readings from Last 3 Encounters:  06/21/17  121 lb (54.9 kg)  03/16/17 131 lb 6.4 oz (59.6 kg)  02/12/17 134 lb (60.8 kg)   General Appearance: Well nourished, appears stressed, in no acute distress. Eyes: PERRLA, EOMs, conjunctiva no swelling or erythema Sinuses: No Frontal/maxillary tenderness ENT/Mouth: Ext aud canals clear, TMs without erythema, bulging. No erythema, swelling, or exudate on post pharynx.  Tonsils not swollen or erythematous. Hearing normal.  Neck: Supple, thyroid normal.  Respiratory: Respiratory effort normal, BS equal bilaterally without rales, rhonchi, wheezing or stridor.  Cardio: RRR with no MRGs. Brisk peripheral pulses without edema.  Abdomen: Soft, + BS.  Non tender, no guarding, rebound, hernias, masses. Lymphatics: Non tender without lymphadenopathy.  Musculoskeletal: Full ROM, 5/5 strength, Normal gait Skin: Warm, dry without rashes, lesions, ecchymosis.  Neuro: Cranial nerves intact. No cerebellar symptoms.  Psych: Awake and oriented X 3, anxious affect, stuttering, Insight and Judgment appropriate.    Izora Ribas, NP 2:54 PM [facility]

## 2017-06-21 ENCOUNTER — Ambulatory Visit: Payer: BLUE CROSS/BLUE SHIELD | Admitting: Adult Health

## 2017-06-21 ENCOUNTER — Encounter: Payer: Self-pay | Admitting: Adult Health

## 2017-06-21 VITALS — BP 110/72 | HR 93 | Temp 97.3°F | Ht 64.0 in | Wt 121.0 lb

## 2017-06-21 DIAGNOSIS — R7309 Other abnormal glucose: Secondary | ICD-10-CM | POA: Diagnosis not present

## 2017-06-21 DIAGNOSIS — E782 Mixed hyperlipidemia: Secondary | ICD-10-CM

## 2017-06-21 DIAGNOSIS — E559 Vitamin D deficiency, unspecified: Secondary | ICD-10-CM | POA: Diagnosis not present

## 2017-06-21 DIAGNOSIS — Z79899 Other long term (current) drug therapy: Secondary | ICD-10-CM | POA: Diagnosis not present

## 2017-06-21 DIAGNOSIS — K219 Gastro-esophageal reflux disease without esophagitis: Secondary | ICD-10-CM | POA: Diagnosis not present

## 2017-06-21 DIAGNOSIS — I10 Essential (primary) hypertension: Secondary | ICD-10-CM | POA: Diagnosis not present

## 2017-06-21 NOTE — Patient Instructions (Signed)
   The 'BRAT' diet is suggested, then progress to diet as tolerated. Call if bloody stools, persistent diarrhea, not voiding regularly, unable to take oral fluids, vomiting, high fever, severe weakness, abdominal pain or failure to improve.    Diarrhea, Adult Diarrhea is frequent loose and watery bowel movements. Diarrhea can make you feel weak and cause you to become dehydrated. Dehydration can make you tired and thirsty, cause you to have a dry mouth, and decrease how often you urinate. Diarrhea typically lasts 2-3 days. However, it can last longer if it is a sign of something more serious. It is important to treat your diarrhea as told by your health care provider. Follow these instructions at home: Eating and drinking  Follow these recommendations as told by your health care provider:  Take an oral rehydration solution (ORS). This is a drink that is sold at pharmacies and retail stores.  Drink clear fluids, such as water, ice chips, diluted fruit juice, and low-calorie sports drinks.  Eat bland, easy-to-digest foods in small amounts as you are able. These foods include bananas, applesauce, rice, lean meats, toast, and crackers.  Avoid drinking fluids that contain a lot of sugar or caffeine, such as energy drinks, sports drinks, and soda.  Avoid alcohol.  Avoid spicy or fatty foods.  General instructions  Drink enough fluid to keep your urine clear or pale yellow.  Wash your hands often. If soap and water are not available, use hand sanitizer.  Make sure that all people in your household wash their hands well and often.  Take over-the-counter and prescription medicines only as told by your health care provider.  Rest at home while you recover.  Watch your condition for any changes.  Take a warm bath to relieve any burning or pain from frequent diarrhea episodes.  Keep all follow-up visits as told by your health care provider. This is important. Contact a health care  provider if:  You have a fever.  Your diarrhea gets worse.  You have new symptoms.  You cannot keep fluids down.  You feel light-headed or dizzy.  You have a headache  You have muscle cramps. Get help right away if:  You have chest pain.  You feel extremely weak or you faint.  You have bloody or black stools or stools that look like tar.  You have severe pain, cramping, or bloating in your abdomen.  You have trouble breathing or you are breathing very quickly.  Your heart is beating very quickly.  Your skin feels cold and clammy.  You feel confused.  You have signs of dehydration, such as: ? Dark urine, very little urine, or no urine. ? Cracked lips. ? Dry mouth. ? Sunken eyes. ? Sleepiness. ? Weakness. This information is not intended to replace advice given to you by your health care provider. Make sure you discuss any questions you have with your health care provider. Document Released: 07/03/2002 Document Revised: 11/21/2015 Document Reviewed: 03/19/2015 Elsevier Interactive Patient Education  2017 [employer].

## 2017-06-22 ENCOUNTER — Other Ambulatory Visit: Payer: Self-pay | Admitting: Adult Health

## 2017-06-22 DIAGNOSIS — E875 Hyperkalemia: Secondary | ICD-10-CM

## 2017-06-22 LAB — CBC WITH DIFFERENTIAL/PLATELET
BASOS ABS: 27 {cells}/uL (ref 0–200)
Basophils Relative: 0.2 %
EOS ABS: 14 {cells}/uL — AB (ref 15–500)
Eosinophils Relative: 0.1 %
HEMATOCRIT: 37.3 % (ref 35.0–45.0)
Hemoglobin: 12.4 g/dL (ref 11.7–15.5)
LYMPHS ABS: 863 {cells}/uL (ref 850–3900)
MCH: 30.4 pg (ref 27.0–33.0)
MCHC: 33.2 g/dL (ref 32.0–36.0)
MCV: 91.4 fL (ref 80.0–100.0)
MPV: 9.5 fL (ref 7.5–12.5)
Monocytes Relative: 2.2 %
NEUTROS PCT: 91.2 %
Neutro Abs: 12494 cells/uL — ABNORMAL HIGH (ref 1500–7800)
PLATELETS: 323 10*3/uL (ref 140–400)
RBC: 4.08 10*6/uL (ref 3.80–5.10)
RDW: 12 % (ref 11.0–15.0)
TOTAL LYMPHOCYTE: 6.3 %
WBC: 13.7 10*3/uL — AB (ref 3.8–10.8)
WBCMIX: 301 {cells}/uL (ref 200–950)

## 2017-06-22 LAB — BASIC METABOLIC PANEL WITH GFR
BUN: 14 mg/dL (ref 7–25)
CALCIUM: 10.7 mg/dL — AB (ref 8.6–10.4)
CHLORIDE: 104 mmol/L (ref 98–110)
CO2: 28 mmol/L (ref 20–32)
CREATININE: 0.74 mg/dL (ref 0.50–0.99)
GFR, EST AFRICAN AMERICAN: 96 mL/min/{1.73_m2} (ref >=60)
GFR, Est Non African American: 83 mL/min/{1.73_m2} (ref >=60)
Glucose, Bld: 109 mg/dL — ABNORMAL HIGH (ref 65–99)
Potassium: 5.6 mmol/L — ABNORMAL HIGH (ref 3.5–5.3)
Sodium: 142 mmol/L (ref 135–146)

## 2017-06-22 LAB — LIPID PANEL
CHOL/HDL RATIO: 2 (calc) (ref <5.0)
Cholesterol: 223 mg/dL — ABNORMAL HIGH (ref <200)
HDL: 112 mg/dL (ref >50)
LDL CHOLESTEROL (CALC): 83 mg/dL
NON-HDL CHOLESTEROL (CALC): 111 mg/dL (ref <130)
TRIGLYCERIDES: 182 mg/dL — AB (ref <150)

## 2017-06-22 LAB — HEMOGLOBIN A1C
EAG (MMOL/L): 5.5 (calc)
Hgb A1c MFr Bld: 5.1 % of total Hgb (ref <5.7)
Mean Plasma Glucose: 100 (calc)

## 2017-06-22 LAB — TSH: TSH: 1.12 m[IU]/L (ref 0.40–4.50)

## 2017-06-22 LAB — VITAMIN D 25 HYDROXY (VIT D DEFICIENCY, FRACTURES): Vit D, 25-Hydroxy: 102 ng/mL — ABNORMAL HIGH (ref 30–100)

## 2017-06-22 LAB — HEPATIC FUNCTION PANEL
AG RATIO: 2.1 (calc) (ref 1.0–2.5)
ALBUMIN MSPROF: 4.7 g/dL (ref 3.6–5.1)
ALT: 8 U/L (ref 6–29)
AST: 16 U/L (ref 10–35)
Alkaline phosphatase (APISO): 57 U/L (ref 33–130)
BILIRUBIN INDIRECT: 0.5 mg/dL (ref 0.2–1.2)
Bilirubin, Direct: 0.1 mg/dL (ref 0.0–0.2)
GLOBULIN: 2.2 g/dL (ref 1.9–3.7)
TOTAL PROTEIN: 6.9 g/dL (ref 6.1–8.1)
Total Bilirubin: 0.6 mg/dL (ref 0.2–1.2)

## 2017-06-22 LAB — MAGNESIUM: MAGNESIUM: 1.7 mg/dL (ref 1.5–2.5)

## 2017-06-30 ENCOUNTER — Telehealth: Payer: Self-pay | Admitting: *Deleted

## 2017-06-30 NOTE — Telephone Encounter (Signed)
Carmen Reynolds,  I called Dhwani to try to get her to schedule a 2 wk follow up lab appointment. Pt says this is typical of her labs to be off & that the reason why her CA level is up is because her OB said for her to double her calcium because of her dexa. I told pt also her Potassium was up & that you had wanted her rechecked. She ask that I send you a note with her response & see if this is necessary. If not she prefers to wait till her next appointment. I told her I would send you a note & that you would probably be responding to her through My Chart with concerns ect... Pt is aware & understands  Thanks  Kathrin Penner

## 2017-07-01 ENCOUNTER — Other Ambulatory Visit: Payer: Self-pay | Admitting: Internal Medicine

## 2017-07-01 ENCOUNTER — Other Ambulatory Visit: Payer: Self-pay | Admitting: Physician Assistant

## 2017-07-01 NOTE — Telephone Encounter (Signed)
I'm not as worried about the calcium, but let her know that high potassium - if it continues to go up rather than resolve - is very dangerous and can cause heart issues/arrythmias, etc - if she is seeing any other doctors soon that usually draw blood she can also request a BMP (or say she needs her potassium checked) to be drawn and then she wouldn't have to come back here. It's her choice to say yes or no but I want her to understand the risks and why we are concerned.   Thanks!  Caryl Pina

## 2017-07-13 ENCOUNTER — Encounter (INDEPENDENT_AMBULATORY_CARE_PROVIDER_SITE_OTHER): Payer: Self-pay

## 2017-07-13 ENCOUNTER — Other Ambulatory Visit: Payer: Self-pay | Admitting: *Deleted

## 2017-07-13 MED ORDER — VERAPAMIL HCL ER 240 MG PO CP24: 240.0000 mg | ORAL_CAPSULE | Freq: Every day | ORAL | 1 refills | Status: DC

## 2017-08-03 DIAGNOSIS — Z23 Encounter for immunization: Secondary | ICD-10-CM | POA: Diagnosis not present

## 2017-08-16 ENCOUNTER — Encounter: Payer: Self-pay | Admitting: Physician Assistant

## 2017-08-16 ENCOUNTER — Other Ambulatory Visit: Payer: Self-pay | Admitting: Physician Assistant

## 2017-08-16 MED ORDER — PROMETHAZINE-DM 6.25-15 MG/5ML PO SYRP: 5.0000 mL | ORAL_SOLUTION | Freq: Four times a day (QID) | ORAL | 1 refills | Status: DC | PRN

## 2017-08-16 MED ORDER — AZITHROMYCIN 250 MG PO TABS
ORAL_TABLET | ORAL | 1 refills | Status: AC
Start: 2017-08-16 — End: 2017-08-21

## 2017-09-13 ENCOUNTER — Encounter (INDEPENDENT_AMBULATORY_CARE_PROVIDER_SITE_OTHER): Payer: Self-pay

## 2017-09-14 ENCOUNTER — Other Ambulatory Visit: Payer: Self-pay | Admitting: *Deleted

## 2017-09-15 ENCOUNTER — Other Ambulatory Visit: Payer: Self-pay | Admitting: *Deleted

## 2017-09-15 ENCOUNTER — Encounter (INDEPENDENT_AMBULATORY_CARE_PROVIDER_SITE_OTHER): Payer: Self-pay

## 2017-09-15 DIAGNOSIS — N3281 Overactive bladder: Secondary | ICD-10-CM

## 2017-09-15 MED ORDER — VERAPAMIL HCL ER 240 MG PO CP24: 240.0000 mg | ORAL_CAPSULE | Freq: Every day | ORAL | 1 refills | Status: DC

## 2017-09-15 MED ORDER — MESALAMINE 1000 MG RE SUPP: 1000.0000 mg | Freq: Every day | RECTAL | 3 refills | Status: DC

## 2017-09-15 MED ORDER — ESOMEPRAZOLE MAGNESIUM 40 MG PO CPDR: 40.0000 mg | DELAYED_RELEASE_CAPSULE | Freq: Every day | ORAL | 1 refills | Status: DC

## 2017-09-15 MED ORDER — FEXOFENADINE HCL 180 MG PO TABS: 180.0000 mg | ORAL_TABLET | ORAL | 1 refills | Status: DC | PRN

## 2017-09-15 MED ORDER — OLMESARTAN MEDOXOMIL 40 MG PO TABS: ORAL_TABLET | ORAL | 1 refills | Status: DC

## 2017-09-15 MED ORDER — SOLIFENACIN SUCCINATE 10 MG PO TABS: ORAL_TABLET | ORAL | 1 refills | Status: DC

## 2017-09-15 MED ORDER — BUPROPION HCL ER (XL) 150 MG PO TB24: 150.0000 mg | ORAL_TABLET | Freq: Every morning | ORAL | 1 refills | Status: DC

## 2017-09-15 MED ORDER — DULOXETINE HCL 60 MG PO CPEP: ORAL_CAPSULE | ORAL | 1 refills | Status: DC

## 2017-09-16 ENCOUNTER — Other Ambulatory Visit: Payer: Self-pay | Admitting: *Deleted

## 2017-09-16 MED ORDER — IBANDRONATE SODIUM 150 MG PO TABS: 150.0000 mg | ORAL_TABLET | ORAL | 0 refills | Status: DC

## 2017-09-16 MED ORDER — METHOCARBAMOL 500 MG PO TABS: 500.0000 mg | ORAL_TABLET | Freq: Four times a day (QID) | ORAL | 0 refills | Status: DC | PRN

## 2017-09-16 MED ORDER — BUSPIRONE HCL 30 MG PO TABS: ORAL_TABLET | ORAL | 0 refills | Status: DC

## 2017-09-21 ENCOUNTER — Encounter (INDEPENDENT_AMBULATORY_CARE_PROVIDER_SITE_OTHER): Payer: Self-pay

## 2017-09-21 ENCOUNTER — Other Ambulatory Visit: Payer: Self-pay | Admitting: Internal Medicine

## 2017-09-21 MED ORDER — OXYBUTYNIN CHLORIDE 5 MG PO TABS: ORAL_TABLET | ORAL | 0 refills | Status: DC

## 2017-09-21 MED ORDER — MONTELUKAST SODIUM 10 MG PO TABS: ORAL_TABLET | ORAL | 1 refills | Status: DC

## 2017-09-21 MED ORDER — MESALAMINE 800 MG PO TBEC: DELAYED_RELEASE_TABLET | ORAL | 1 refills | Status: DC

## 2017-09-28 ENCOUNTER — Other Ambulatory Visit: Payer: Self-pay | Admitting: *Deleted

## 2017-09-28 MED ORDER — MESALAMINE 800 MG PO TBEC: DELAYED_RELEASE_TABLET | ORAL | 1 refills | Status: DC

## 2017-09-28 MED ORDER — MONTELUKAST SODIUM 10 MG PO TABS: ORAL_TABLET | ORAL | 1 refills | Status: DC

## 2017-09-28 MED ORDER — OXYBUTYNIN CHLORIDE 5 MG PO TABS: ORAL_TABLET | ORAL | 0 refills | Status: DC

## 2017-09-29 ENCOUNTER — Encounter (INDEPENDENT_AMBULATORY_CARE_PROVIDER_SITE_OTHER): Payer: Self-pay

## 2017-09-30 ENCOUNTER — Ambulatory Visit (INDEPENDENT_AMBULATORY_CARE_PROVIDER_SITE_OTHER): Payer: Medicare Other | Admitting: Internal Medicine

## 2017-09-30 VITALS — BP 98/60 | HR 96 | Temp 97.0°F | Resp 16 | Ht 64.0 in | Wt 125.6 lb

## 2017-09-30 DIAGNOSIS — N182 Chronic kidney disease, stage 2 (mild): Secondary | ICD-10-CM | POA: Diagnosis not present

## 2017-09-30 DIAGNOSIS — K219 Gastro-esophageal reflux disease without esophagitis: Secondary | ICD-10-CM | POA: Diagnosis not present

## 2017-09-30 DIAGNOSIS — E782 Mixed hyperlipidemia: Secondary | ICD-10-CM

## 2017-09-30 DIAGNOSIS — R7309 Other abnormal glucose: Secondary | ICD-10-CM | POA: Diagnosis not present

## 2017-09-30 DIAGNOSIS — R7303 Prediabetes: Secondary | ICD-10-CM

## 2017-09-30 DIAGNOSIS — I10 Essential (primary) hypertension: Secondary | ICD-10-CM

## 2017-09-30 DIAGNOSIS — M25541 Pain in joints of right hand: Secondary | ICD-10-CM

## 2017-09-30 DIAGNOSIS — Z23 Encounter for immunization: Secondary | ICD-10-CM

## 2017-09-30 DIAGNOSIS — E559 Vitamin D deficiency, unspecified: Secondary | ICD-10-CM

## 2017-09-30 DIAGNOSIS — K5190 Ulcerative colitis, unspecified, without complications: Secondary | ICD-10-CM | POA: Diagnosis not present

## 2017-09-30 NOTE — Progress Notes (Addendum)
This very nice 67 y.o. Sep WF  presents for 6 month follow up with HTN, HLD, GERD, Pre-Diabetes and Vitamin D Deficiency. Patient has hx/o GERD controlled w/ meds & is followed by Dr Collene Mares for Ulcerative Colitis. She reports her colitis has recently flared with severe lower abdominal pain and uncontrollable diarrhea.  Patient also has Chronic Low Back Pain and has undergone Lumbar Fusion and in May 2018, she underwent ACD/Cx Fusion also. Today, she's c/o exquisite pain in the Low Back are making it difficult to sit for extended periods of time and also is c/o pain at the basis of the Rt thumb.     Patient is treated for HTN (1997) & BP has been controlled at home. Today's BP is sl low - 98/60. Patient has had no complaints of any cardiac type chest pain, palpitations, dyspnea / orthopnea / PND, dizziness, claudication, or dependent edema.     Hyperlipidemia is controlled with diet & meds. Patient denies myalgias or other med SE's. Last Lipids were at goal albeit elevated Trig's: Lab Results  Component Value Date   CHOL 220 (H) 09/30/2017   HDL 110 09/30/2017   LDLCALC 78 11/12/2016   TRIG 264 (H) 09/30/2017   CHOLHDL 2.0 09/30/2017      Also, the patient has history of PreDiabetes (A1c 5.7%/2013) and has had no symptoms of reactive hypoglycemia, diabetic polys, paresthesias or visual blurring.  Last A1c was Normal & at goal: Lab Results  Component Value Date   HGBA1C 4.9 09/30/2017      Further, the patient also has history of Vitamin D Deficiency ("28"/2008) and supplements vitamin D without any suspected side-effects. Last vitamin D was at goal:  Lab Results  Component Value Date   VD25OH 74 09/30/2017   Current Outpatient Medications on File Prior to Visit  Medication Sig  . Ascorbic Acid (VITAMIN C PO) Take 625 mg by mouth daily.  [name] buPROPion (WELLBUTRIN XL) 150 MG 24 hr tablet Take 1 tablet (150 mg total) by mouth every morning.  . busPIRone (BUSPAR) 30 MG tablet TAKE 2 TABLET  BY MOUTH ONCE DAILY  . Calcium Carbonate-Vit D-Min (CALCIUM 1200 PO) Take 2,400 mg by mouth daily.  . cholecalciferol (VITAMIN D) 1000 UNITS tablet Take 5,000 Units by mouth daily.   . Coenzyme Q10 (CO Q 10) 100 MG CAPS Take 200 mg by mouth 2 (two) times daily. Quol Ultra CoQ10 18m   . DULoxetine (CYMBALTA) 60 MG capsule TAKE 1 CAPSULE DAILY FOR   MOOD AND PAIN  . esomeprazole (NEXIUM) 40 MG capsule Take 1 capsule (40 mg total) by mouth daily.  . fexofenadine (ALLEGRA) 180 MG tablet Take 1 tablet (180 mg total) by mouth as needed for allergies.  .Marland Kitchenibandronate (BONIVA) 150 MG tablet Take 1 tablet (150 mg total) by mouth every 30 (thirty) days. Take in the morning with a full glass of water, on an empty stomach, and do not take anything else by mouth or lie down for the next 30 min.  .Marland Kitchenloperamide (IMODIUM) 2 MG capsule Take 2 mg by mouth as needed for diarrhea or loose stools.  . Methylcellulose, Laxative, (CITRUCEL) 500 MG TABS Take 1,000 mg by mouth 2 (two) times daily.   . montelukast (SINGULAIR) 10 MG tablet Take 1 tablet daily for Allergies  . olmesartan (BENICAR) 40 MG tablet Take 1 tablet daily for BP  . OVER THE COUNTER MEDICATION Take 300 mg by mouth daily. Mega red 3026m .  oxybutynin (DITROPAN) 5 MG tablet Take 1/2 to 1 tablet 2 to 3 x / day for Bladder  . predniSONE (DELTASONE) 5 MG tablet TAKE 1 TABLET 3 TIMES A DAYOR AS DIRECTED (Patient taking differently: TAKE 1 TABLET BY MOUTH DAILY AS NEEDED)  . promethazine-dextromethorphan (PROMETHAZINE-DM) 6.25-15 MG/5ML syrup Take 5 mLs by mouth 4 (four) times daily as needed for cough.  . Red Yeast Rice Extract 600 MG TABS Take 1,200 mg by mouth 2 (two) times daily.   No current facility-administered medications on file prior to visit.    Allergies  Allergen Reactions  . Meloxicam Other (See Comments)    Intestinal Bleed  . Augmentin [Amoxicillin-Pot Clavulanate] Diarrhea  . Lyrica [Pregabalin] Swelling    Swelling of feet.  . Other  Other (See Comments)    Salt-sensitivity Grain-sensitivity Artificial sweetners-sensitivity Sugar-sensitivity  . Wheat Bran Other (See Comments)    Wheat-sensitivity  . Lactose Intolerance (Gi) Other (See Comments)    unknown  . Quinapril Other (See Comments)    unknown  . Vasotec [Enalaprilat] Cough   PMHx:   Past Medical History:  Diagnosis Date  . Adult celiac disease   . Concussion    fell jumping off of horse in past 6 months- 09/14/12  . DDD (degenerative disc disease)   . Diverticulitis   . DJD (degenerative joint disease)   . Factor V Leiden ([place])   . Fibromyalgia   . Fusion of spine of lumbar region t  . GERD (gastroesophageal reflux disease)   . History of fusion of cervical spine t  . Hyperlipidemia   . Hypertension   . Lactose intolerance   . Proctitis   . Scoliosis   . Spinal stenosis   . Ulcerative colitis ([place])   . Vitamin D deficiency    Immunization History  Administered Date(s) Administered  . Influenza-Unspecified 04/26/2014, 08/03/2017  . Pneumococcal Conjugate-13 08/03/2017  . Pneumococcal Polysaccharide-23 09/26/2012  . Td 01/22/2016   Past Surgical History:  Procedure Laterality Date  . ANTERIOR CERVICAL DECOMP/DISCECTOMY FUSION N/A 02/12/2017   Procedure: C3-4 C4-5 Anterior cervical decompression/discectomy/fusion/exploration and removal of plate at C5 to C7;  Surgeon: Erline Levine, MD;  Location: [facility];  Service: Neurosurgery;  Laterality: N/A;  C3-4 C4-5 Anterior cervical decompression/discectomy/fusion/exploration and removal of plate at C5 to C7  . ANTERIOR LATERAL LUMBAR FUSION 4 LEVELS N/A 09/22/2012   Procedure: ANTERIOR LATERAL LUMBAR FUSION 4 LEVELS;  Surgeon: Erline Levine, MD;  Location: [facility];  Service: Neurosurgery;  Laterality: N/A;  Thoracic Twelve Lumbar One,Lumbar One-TwoAnterolateral decompression/fusion/percutaneous percutaneous pedicle screws   . BACK SURGERY  2006, 2008   . disectomy  . CHEST TUBE INSERTION Left  09/22/2012   Procedure: CHEST TUBE INSERTION;  Surgeon: Erline Levine, MD;  Location: [facility];  Service: Neurosurgery;  Laterality: Left;  Insertion per Dr. Cyndia Bent  . CHOLECYSTECTOMY N/A 11/22/2013   Procedure: LAPAROSCOPIC CHOLECYSTECTOMY ;  Surgeon: Harl Bowie, MD;  Location: [facility];  Service: General;  Laterality: N/A;  . EYE SURGERY    . FOOT SURGERY Right 1978   Reconstruction after faliure of emergency stitching  . LASIK    . NECK SURGERY  2002   anterior cervical fusion  . TUBAL LIGATION  1983   FHx:    Reviewed / unchanged  SHx:    Reviewed / unchanged  Systems Review:  Constitutional: Denies fever, chills, wt changes, headaches, insomnia, fatigue, night sweats, change in appetite. Eyes: Denies redness, blurred vision, diplopia, discharge, itchy,  watery eyes.  ENT: Denies discharge, congestion, post nasal drip, epistaxis, sore throat, earache, hearing loss, dental pain, tinnitus, vertigo, sinus pain, snoring.  CV: Denies chest pain, palpitations, irregular heartbeat, syncope, dyspnea, diaphoresis, orthopnea, PND, claudication or edema. Respiratory: denies cough, dyspnea, DOE, pleurisy, hoarseness, laryngitis, wheezing.  Gastrointestinal: Denies dysphagia, odynophagia, heartburn, reflux, water brash, abdominal pain or cramps, nausea, vomiting, bloating, diarrhea, constipation, hematemesis, melena, hematochezia  or hemorrhoids. Genitourinary: Denies dysuria, frequency, urgency, nocturia, hesitancy, discharge, hematuria or flank pain. Musculoskeletal: Denies arthralgias, myalgias, stiffness, jt. swelling, pain, limping or strain/sprain.  Skin: Denies pruritus, rash, hives, warts, acne, eczema or change in skin lesion(s). Neuro: No weakness, tremor, incoordination, spasms, paresthesia or pain. Psychiatric: Denies confusion, memory loss or sensory loss. Endo: Denies change in weight, skin or hair change.  Heme/Lymph: No excessive bleeding, bruising or enlarged lymph  nodes.  Physical Exam  BP 98/60   Pulse 96   Temp (!) 97 F (36.1 C)   Resp 16   Ht 5' 4"  (1.626 m)   Wt 125 lb 9.6 oz (57 kg)   BMI 21.56 kg/m   Appears  well nourished, well groomed  and in no distress.  Eyes: PERRLA, EOMs, conjunctiva no swelling or erythema. Sinuses: No frontal/maxillary tenderness ENT/Mouth: EAC's clear, TM's nl w/o erythema, bulging. Nares clear w/o erythema, swelling, exudates. Oropharynx clear without erythema or exudates. Oral hygiene is good. Tongue normal, non obstructing. Hearing intact.  Neck: Supple. Thyroid not palpable. Car 2+/2+ without bruits, nodes or JVD. Chest: Respirations nl with BS clear & equal w/o rales, rhonchi, wheezing or stridor.  Cor: Heart sounds normal w/ regular rate and rhythm without sig. murmurs, gallops, clicks or rubs. Peripheral pulses normal and equal  without edema.  Abdomen: Soft & bowel sounds normal. Non-tender w/o guarding, rebound, hernias, masses or organomegaly.  Lymphatics: Unremarkable.  Musculoskeletal: Full ROM all peripheral extremities, joint stability, 5/5 strength and normal gait. (+) tender at base of Rt thumb.  Skin: Warm, dry without exposed rashes, lesions or ecchymosis apparent.  Neuro: Cranial nerves intact, reflexes equal bilaterally. Sensory-motor testing grossly intact. Tendon reflexes grossly intact.  Pysch: Alert & oriented x 3.  Insight and judgement nl & appropriate. No ideations.  Assessment and Plan:  1. Essential hypertension  - Continue medication, monitor blood pressure at home.  - Continue DASH diet. Reminder to go to the ER if any CP,  SOB, nausea, dizziness, severe HA, changes vision/speech.  - CBC with Differential/Platelet - BASIC METABOLIC PANEL WITH GFR - Magnesium  2. Hyperlipidemia, mixed  - Continue diet/meds, exercise,& lifestyle modifications.  - Continue monitor periodic cholesterol/liver & renal functions   - Hepatic function panel - Lipid panel - TSH  3. Blood  glucose abnormal  - Continue diet, exercise, lifestyle modifications.  - Monitor appropriate labs.  - Hemoglobin A1c - Insulin, random  4. Vitamin D deficiency  - Continue supplementation.   - VITAMIN D 25 Hydroxyl  5. Prediabetes  - Hemoglobin A1c - Insulin, random  6. CKD2 (GFR 77 ml/min)   - BASIC METABOLIC PANEL WITH GFR  7. Gastroesophageal reflux disease  - CBC with Differential/Platelet  8. Ulcerative colitis in [place] ([place])  9. Need for shingles vaccine  - Varicella zoster antibody, IgG -> titer is adequate and Vaccination not needed  10. Low Back Pain due to Deg Disc Disease  - advised rest & stretching   11. Arthralgia of right hand  - Cyclic citrul peptide antibody, IgG - Sedimentation rate - Anti-Gerads antibody -  C-reactive protein         Discussed  regular exercise, BP monitoring, weight control to achieve/maintain BMI less than 25 and discussed med and SE's. Recommended labs to assess and monitor clinical status with further disposition pending results of labs. Over 30 minutes of exam, counseling, chart review was performed.

## 2017-09-30 NOTE — Patient Instructions (Signed)

## 2017-10-01 ENCOUNTER — Encounter (INDEPENDENT_AMBULATORY_CARE_PROVIDER_SITE_OTHER): Payer: Self-pay

## 2017-10-01 ENCOUNTER — Other Ambulatory Visit: Payer: Self-pay | Admitting: Internal Medicine

## 2017-10-01 LAB — INSULIN, RANDOM: INSULIN: 7 u[IU]/mL (ref 2.0–19.6)

## 2017-10-01 LAB — CBC WITH DIFFERENTIAL/PLATELET
BASOS PCT: 0.6 %
Basophils Absolute: 48 cells/uL (ref 0–200)
EOS PCT: 1.4 %
Eosinophils Absolute: 112 cells/uL (ref 15–500)
HCT: 36.3 % (ref 35.0–45.0)
HEMOGLOBIN: 12.1 g/dL (ref 11.7–15.5)
Lymphs Abs: 2384 cells/uL (ref 850–3900)
MCH: 30.8 pg (ref 27.0–33.0)
MCHC: 33.3 g/dL (ref 32.0–36.0)
MCV: 92.4 fL (ref 80.0–100.0)
MONOS PCT: 9.6 %
MPV: 9.6 fL (ref 7.5–12.5)
NEUTROS ABS: 4688 {cells}/uL (ref 1500–7800)
Neutrophils Relative %: 58.6 %
PLATELETS: 263 10*3/uL (ref 140–400)
RBC: 3.93 10*6/uL (ref 3.80–5.10)
RDW: 12.4 % (ref 11.0–15.0)
TOTAL LYMPHOCYTE: 29.8 %
WBC mixed population: 768 cells/uL (ref 200–950)
WBC: 8 10*3/uL (ref 3.8–10.8)

## 2017-10-01 LAB — VARICELLA ZOSTER ANTIBODY, IGG: Varicella IgG: 280.4 index

## 2017-10-01 LAB — BASIC METABOLIC PANEL WITH GFR
BUN: 14 mg/dL (ref 7–25)
CALCIUM: 9.7 mg/dL (ref 8.6–10.4)
CHLORIDE: 104 mmol/L (ref 98–110)
CO2: 27 mmol/L (ref 20–32)
CREATININE: 0.74 mg/dL (ref 0.50–0.99)
GFR, Est African American: 96 mL/min/{1.73_m2} (ref >=60)
GFR, Est Non African American: 83 mL/min/{1.73_m2} (ref >=60)
GLUCOSE: 78 mg/dL (ref 65–99)
Potassium: 4.5 mmol/L (ref 3.5–5.3)
Sodium: 143 mmol/L (ref 135–146)

## 2017-10-01 LAB — LIPID PANEL
Cholesterol: 220 mg/dL — ABNORMAL HIGH (ref <200)
HDL: 110 mg/dL (ref >50)
LDL Cholesterol (Calc): 75 mg/dL (calc)
NON-HDL CHOLESTEROL (CALC): 110 mg/dL (ref <130)
Total CHOL/HDL Ratio: 2 (calc) (ref <5.0)
Triglycerides: 264 mg/dL — ABNORMAL HIGH (ref <150)

## 2017-10-01 LAB — HEPATIC FUNCTION PANEL
AG Ratio: 2.3 (calc) (ref 1.0–2.5)
ALT: 19 U/L (ref 6–29)
AST: 24 U/L (ref 10–35)
Albumin: 4.8 g/dL (ref 3.6–5.1)
Alkaline phosphatase (APISO): 61 U/L (ref 33–130)
Bilirubin, Direct: 0.1 mg/dL (ref 0.0–0.2)
Globulin: 2.1 g/dL (ref 1.9–3.7)
Indirect Bilirubin: 0.3 mg/dL (ref 0.2–1.2)
Total Bilirubin: 0.4 mg/dL (ref 0.2–1.2)
Total Protein: 6.9 g/dL (ref 6.1–8.1)

## 2017-10-01 LAB — VITAMIN D 25 HYDROXY (VIT D DEFICIENCY, FRACTURES): Vit D, 25-Hydroxy: 74 ng/mL (ref 30–100)

## 2017-10-01 LAB — TSH: TSH: 2.42 m[IU]/L (ref 0.40–4.50)

## 2017-10-01 LAB — HEMOGLOBIN A1C
EAG (MMOL/L): 5.2 (calc)
Hgb A1c MFr Bld: 4.9 % of total Hgb (ref <5.7)
MEAN PLASMA GLUCOSE: 94 (calc)

## 2017-10-01 LAB — ANTI-SMITH ANTIBODY: ENA SM AB SER-ACNC: NEGATIVE AI

## 2017-10-01 LAB — MAGNESIUM: MAGNESIUM: 2 mg/dL (ref 1.5–2.5)

## 2017-10-01 LAB — CYCLIC CITRUL PEPTIDE ANTIBODY, IGG: Cyclic Citrullin Peptide Ab: <16 UNITS

## 2017-10-01 LAB — SEDIMENTATION RATE: SED RATE: 6 mm/h (ref 0–30)

## 2017-10-01 LAB — C-REACTIVE PROTEIN: CRP: 2.2 mg/L (ref <8.0)

## 2017-10-01 MED ORDER — MESALAMINE 800 MG PO TBEC: DELAYED_RELEASE_TABLET | ORAL | 1 refills | Status: DC

## 2017-10-03 ENCOUNTER — Encounter: Payer: Self-pay | Admitting: Internal Medicine

## 2017-10-04 ENCOUNTER — Other Ambulatory Visit: Payer: Self-pay | Admitting: Internal Medicine

## 2017-10-04 ENCOUNTER — Encounter (INDEPENDENT_AMBULATORY_CARE_PROVIDER_SITE_OTHER): Payer: Self-pay

## 2017-10-04 MED ORDER — MONTELUKAST SODIUM 10 MG PO TABS: ORAL_TABLET | ORAL | 3 refills | Status: DC

## 2017-11-02 ENCOUNTER — Encounter: Payer: Self-pay | Admitting: Internal Medicine

## 2017-11-02 ENCOUNTER — Other Ambulatory Visit: Payer: Self-pay | Admitting: Internal Medicine

## 2017-11-02 DIAGNOSIS — F5101 Primary insomnia: Secondary | ICD-10-CM

## 2017-11-02 MED ORDER — TRAZODONE HCL 100 MG PO TABS: ORAL_TABLET | ORAL | 0 refills | Status: DC

## 2017-11-15 ENCOUNTER — Other Ambulatory Visit: Payer: Self-pay | Admitting: Internal Medicine

## 2017-11-15 MED ORDER — OLMESARTAN MEDOXOMIL 20 MG PO TABS: ORAL_TABLET | ORAL | 1 refills | Status: DC

## 2017-11-16 ENCOUNTER — Other Ambulatory Visit: Payer: Self-pay | Admitting: Internal Medicine

## 2017-11-16 MED ORDER — OLMESARTAN MEDOXOMIL 20 MG PO TABS: ORAL_TABLET | ORAL | 1 refills | Status: DC

## 2017-11-30 ENCOUNTER — Other Ambulatory Visit: Payer: Self-pay | Admitting: Internal Medicine

## 2017-11-30 ENCOUNTER — Encounter: Payer: Self-pay | Admitting: Internal Medicine

## 2017-11-30 MED ORDER — PREDNISOLONE ACETATE 1 % OP SUSP: 1.0000 [drp] | Freq: Four times a day (QID) | OPHTHALMIC | 0 refills | Status: DC

## 2017-11-30 MED ORDER — LOSARTAN POTASSIUM 100 MG PO TABS: ORAL_TABLET | ORAL | 1 refills | Status: DC

## 2017-12-02 ENCOUNTER — Encounter: Payer: Self-pay | Admitting: Internal Medicine

## 2017-12-03 ENCOUNTER — Encounter: Payer: Self-pay | Admitting: Internal Medicine

## 2017-12-15 DIAGNOSIS — M1712 Unilateral primary osteoarthritis, left knee: Secondary | ICD-10-CM | POA: Diagnosis not present

## 2017-12-15 DIAGNOSIS — M65311 Trigger thumb, right thumb: Secondary | ICD-10-CM | POA: Diagnosis not present

## 2018-01-05 DIAGNOSIS — G4700 Insomnia, unspecified: Secondary | ICD-10-CM | POA: Insufficient documentation

## 2018-01-05 NOTE — Progress Notes (Signed)
MEDICARE ANNUAL WELLNESS VISIT AND FOLLOW UP  Assessment:   Diagnoses and all orders for this visit:  Welcome to Medicare preventive visit  Essential hypertension Continue medication Monitor blood pressure at home; call if consistently over 130/80 Continue DASH diet.   Reminder to go to the ER if any CP, SOB, nausea, dizziness, severe HA, changes vision/speech, left arm numbness and tingling and jaw pain.  Ulcerative colitis without complications, unspecified location [facility]) Followed by Dr. Collene Mares, on mesalamine  Gastroesophageal reflux disease, esophagitis presence not specified Well managed on current medications Discussed diet, avoiding triggers and other lifestyle changes  Herniated cervical disc Followed by Dr. Vertell Limber  DDD (degenerative disc disease), lumbar Followed by Dr. Vertell Limber, emphasized to follow up for pain management plan, has tried NSAIDS etc without benefit  CKD2 (GFR 77 ml/min)  Increase fluids, avoid NSAIDS, monitor sugars, will monitor  Factor 5 Leiden mutation, heterozygous ([place]) On ASA  Vitamin D deficiency At goal at recent check; continue to recommend supplementation for goal of 70-100 Defer vitamin D level  Hyperlipidemia Continue medications: Continue low cholesterol diet and exercise.  Check lipid panel.   Hallux rigidus of right foot Wearing brace  Abnormality of gait Followed by Dr. Vertell Limber  Abnormal glucose Recent A1Cs at goal Discussed diet/exercise, weight management  Defer A1C; check BMP  Insomnia, unspecified type Doing well with trazodone   Major depression Stop wellbutrin, start lexapro 10 mg daily, continue buspar Lifestyle discussed: diet/exerise, sleep hygiene, stress management, hydration  Over 40 minutes of exam, counseling, chart review and critical decision making was performed Future Appointments  Date Time Provider [place]  04/22/2018 10:00 AM Unk Pinto, MD [facility] None     Plan:   During the  course of the visit the patient was educated and counseled about appropriate screening and preventive services including:    Pneumococcal vaccine   Prevnar 13  Influenza vaccine  Td vaccine  Screening electrocardiogram  Bone densitometry screening  Colorectal cancer screening  Diabetes screening  Glaucoma screening  Nutrition counseling   Advanced directives: requested   Subjective:  Carmen Reynolds is a 67 y.o. female who presents for Medicare Annual Wellness Visit and 3 month follow up.   She wellbutrin 150 mg daily, buspar 30 mg BID would like to try switching off of wellbutrin due to tremors/shakes.  She is on boniva for severe osteoporosis.  She has hx/o UC followed by Dr  Collene Mares.  Patient has DDD and has underdone Lumbar fusion in and more recrently in May had ACD/Cx Fusion by Dr Vertell Limber.   BMI is Body mass index is 22.31 kg/m., she has not been working on diet and exercise. Wt Readings from Last 3 Encounters:  01/06/18 130 lb (59 kg)  09/30/17 125 lb 9.6 oz (57 kg)  06/21/17 121 lb (54.9 kg)   Today their BP is BP: (!) 94/58 She does not workout. She denies chest pain, shortness of breath, dizziness.   She is not on cholesterol medication (on RYRS) and denies myalgias. Her cholesterol is at goal. The cholesterol last visit was:   Lab Results  Component Value Date   CHOL 220 (H) 09/30/2017   HDL 110 09/30/2017   LDLCALC 75 09/30/2017   TRIG 264 (H) 09/30/2017   CHOLHDL 2.0 09/30/2017   Last A1C in the office was:  Lab Results  Component Value Date   HGBA1C 4.9 09/30/2017   Last GFR: Lab Results  Component Value Date   GFRNONAA 83 09/30/2017  Patient is on Vitamin D supplement.   Lab Results  Component Value Date   VD25OH 74 09/30/2017      Medication Review: Current Outpatient Medications on File Prior to Visit  Medication Sig Dispense Refill  . Ascorbic Acid (VITAMIN C PO) Take 625 mg by mouth daily.    [name] buPROPion (WELLBUTRIN XL) 150 MG  24 hr tablet Take 1 tablet (150 mg total) by mouth every morning. 90 tablet 1  . busPIRone (BUSPAR) 30 MG tablet TAKE 2 TABLET BY MOUTH ONCE DAILY 180 tablet 0  . Calcium Carbonate-Vit D-Min (CALCIUM 1200 PO) Take 2,400 mg by mouth daily.    . cholecalciferol (VITAMIN D) 1000 UNITS tablet Take 5,000 Units by mouth daily.     . Coenzyme Q10 (CO Q 10) 100 MG CAPS Take 200 mg by mouth 2 (two) times daily. Quol Ultra CoQ10 161m     . DULoxetine (CYMBALTA) 60 MG capsule TAKE 1 CAPSULE DAILY FOR   MOOD AND PAIN 90 capsule 1  . esomeprazole (NEXIUM) 40 MG capsule Take 1 capsule (40 mg total) by mouth daily. 90 capsule 1  . fexofenadine (ALLEGRA) 180 MG tablet Take 1 tablet (180 mg total) by mouth as needed for allergies. 90 tablet 1  . ibandronate (BONIVA) 150 MG tablet Take 1 tablet (150 mg total) by mouth every 30 (thirty) days. Take in the morning with a full glass of water, on an empty stomach, and do not take anything else by mouth or lie down for the next 30 min. 12 tablet 0  . loperamide (IMODIUM) 2 MG capsule Take 2 mg by mouth as needed for diarrhea or loose stools.    .Marland Kitchenlosartan (COZAAR) 100 MG tablet Take 1 tablet daily for BP 90 tablet 1  . Mesalamine (ASACOL HD) 800 MG TBEC Take 2 tablets 3 x / day for Colitis. 540 tablet 1  . Methylcellulose, Laxative, (CITRUCEL) 500 MG TABS Take 1,000 mg by mouth 2 (two) times daily.     . montelukast (SINGULAIR) 10 MG tablet Take 1 tablet daily for Allergies 90 tablet 3  . OVER THE COUNTER MEDICATION Take 300 mg by mouth daily. Mega red 306m   . oxybutynin (DITROPAN) 5 MG tablet Take 1/2 to 1 tablet 2 to 3 x / day for Bladder 90 tablet 0  . prednisoLONE acetate (PRED FORTE) 1 % ophthalmic suspension Place 1 drop into both eyes 4 (four) times daily. 5 mL 0  . predniSONE (DELTASONE) 5 MG tablet TAKE 1 TABLET 3 TIMES A DAYOR AS DIRECTED (Patient taking differently: TAKE 1 TABLET BY MOUTH DAILY AS NEEDED) 270 tablet 1  . promethazine-dextromethorphan  (PROMETHAZINE-DM) 6.25-15 MG/5ML syrup Take 5 mLs by mouth 4 (four) times daily as needed for cough. 240 mL 1  . Red Yeast Rice Extract 600 MG TABS Take 1,200 mg by mouth 2 (two) times daily.    . traZODone (DESYREL) 100 MG tablet Take 1 to 2 tablets 1 hour before Sleep 180 tablet 0   No current facility-administered medications on file prior to visit.     Allergies  Allergen Reactions  . Meloxicam Other (See Comments)    Intestinal Bleed  . Augmentin [Amoxicillin-Pot Clavulanate] Diarrhea  . Lyrica [Pregabalin] Swelling    Swelling of feet.  . Other Other (See Comments)    Salt-sensitivity Grain-sensitivity Artificial sweetners-sensitivity Sugar-sensitivity  . Wheat Bran Other (See Comments)    Wheat-sensitivity  . Lactose Intolerance (Gi) Other (See Comments)    unknown  .  Quinapril Other (See Comments)    unknown  . Vasotec [Enalaprilat] Cough    Current Problems (verified) Patient Active Problem List   Diagnosis Date Noted  . Insomnia 01/05/2018  . Herniated cervical disc 02/12/2017  . CKD2 (GFR 77 ml/min)  01/04/2015  . GERD  01/04/2015  . Factor 5 Leiden mutation, heterozygous ([place]) 05/23/2014  . Chronic cholecystitis 10/23/2013  . Hyperlipidemia 10/10/2013  . Abnormal glucose 10/10/2013  . Medication management 10/10/2013  . Vitamin D deficiency 10/10/2013  . Abnormality of gait 03/08/2013  . Hallux rigidus of right foot 02/07/2013  . Essential hypertension September 29, 202008  . Ulcerative colitis ([place]) September 29, 202008  . DDD (degenerative disc disease), lumbar September 29, 202008  . LAMINECTOMY, LUMBAR, HX OF September 29, 202008    Screening Tests Immunization History  Administered Date(s) Administered  . Influenza-Unspecified 04/26/2014, 08/03/2017  . Pneumococcal Conjugate-13 08/03/2017  . Pneumococcal Polysaccharide-23 09/26/2012  . Td 01/22/2016   Preventative care: Last colonoscopy: 05/2017 Last mammogram: 01/2017 Last pap smear/pelvic exam: 2018 - sees OBGYN DEXA: 01/2016  T  -5.8 - managed by OBGYN, on boniva  Prior vaccinations: TD or Tdap: 2017  Influenza: 2019  Pneumococcal: 2014 Prevnar13: 2019 Shingles/Zostavax: Declines  Names of Other Physician/Practitioners you currently use: 1. [facility] here for primary care 2. Dr. Katy Fitch, eye doctor, last visit 2018 3. Dr. Jolayne Panther, dentist, last visit 2018  Patient Care Team: Unk Pinto, MD as PCP - General (Internal Medicine)  SURGICAL HISTORY She  has a past surgical history that includes Tubal ligation (1983); Foot surgery (Right, 1978); Neck surgery (2002); LASIK; Eye surgery; Anterior lateral lumbar fusion 4 levels (N/A, 09/22/2012); Chest tube insertion (Left, 09/22/2012); Back surgery (2006, 2008); Cholecystectomy (N/A, 11/22/2013); and Anterior cervical decomp/discectomy fusion (N/A, 02/12/2017). FAMILY HISTORY Her family history includes Cancer in her father; Heart attack in her mother; Heart attack (age of onset: 64) in her brother; Heart attack (age of onset: 69) in her brother; Heart disease in her brother; Heart failure in her mother; Hypertension in her brother and mother; Prostate cancer in her father; Stroke in her mother. SOCIAL HISTORY She  reports that she quit smoking about 27 years ago. Her smoking use included cigarettes. She has a 24.00 pack-year smoking history. She has never used smokeless tobacco. She reports that she drinks about 4.2 oz of alcohol per week. She reports that she does not use drugs.   MEDICARE WELLNESS OBJECTIVES: Physical activity: Current Exercise Habits: The patient does not participate in regular exercise at present, Exercise limited by: neurologic condition(s);orthopedic condition(s) Cardiac risk factors: Cardiac Risk Factors include: advanced age (>52mn, >>86women);dyslipidemia;hypertension;sedentary lifestyle;smoking/ tobacco exposure Depression/mood screen:   [facility] 01/06/2018  Decreased Interest 3  Down,  Depressed, Hopeless 3  PHQ - 2 Score 6  Altered sleeping 3  Tired, decreased energy 3  Change in appetite 2  Trouble concentrating 1  Moving slowly or fidgety/restless 3  Suicidal thoughts 0  PHQ-9 Score 18  Difficult doing work/chores Somewhat difficult    ADLs:  In your present state of health, do you have any difficulty performing the following activities: 01/06/2018 10/03/2017  Hearing? N N  Vision? N N  Difficulty concentrating or making decisions? N N  Walking or climbing stairs? Y N  Comment Pain, followed by Dr. SVertell Limber-  Dressing or bathing? N N  Doing errands, shopping? N N  Preparing Food and eating ? N -  Using the Toilet? N -  In the past six months, have  you accidently leaked urine? N -  Do you have problems with loss of bowel control? N -  Managing your Medications? N -  Managing your Finances? N -  Housekeeping or managing your Housekeeping? N -  Some recent data might be hidden     Cognitive Testing  Alert? Yes  Normal Appearance?Yes  Oriented to person? Yes  Place? Yes   Time? Yes  Recall of three objects?  Yes  Can perform simple calculations? Yes  Displays appropriate judgment?Yes  Can read the correct time from a watch face?Yes  EOL planning: Does Patient Have a Medical Advance Directive?: No Would patient like information on creating a medical advance directive?: No - Patient declined  Review of Systems  Constitutional: Negative for malaise/fatigue and weight loss.  HENT: Negative for hearing loss and tinnitus.   Eyes: Negative for blurred vision and double vision.  Respiratory: Negative for cough, sputum production, shortness of breath and wheezing.   Cardiovascular: Negative for chest pain, palpitations, orthopnea, claudication, leg swelling and PND.  Gastrointestinal: Negative for abdominal pain, blood in stool, constipation, diarrhea, heartburn, melena, nausea and vomiting.  Genitourinary: Negative.   Musculoskeletal: Negative for falls, joint  pain and myalgias.  Skin: Negative for rash.  Neurological: Negative for dizziness, tingling, sensory change, weakness and headaches.  Endo/Heme/Allergies: Negative for polydipsia.  Psychiatric/Behavioral: Positive for depression. Negative for memory loss, substance abuse and suicidal ideas. The patient is nervous/anxious. The patient does not have insomnia.   All other systems reviewed and are negative.    Objective:     Today's Vitals   01/06/18 1530  BP: (!) 94/58  Pulse: 76  Temp: (!) 97.5 F (36.4 C)  SpO2: 98%  Weight: 130 lb (59 kg)  Height: 5' 4"  (1.626 m)   Body mass index is 22.31 kg/m.  General appearance: alert, no distress, WD/WN, female HEENT: normocephalic, sclerae anicteric, TMs pearly, nares patent, no discharge or erythema, pharynx normal Oral cavity: MMM, no lesions Neck: supple, no lymphadenopathy, no thyromegaly, no masses Heart: RRR, normal S1, S2, no murmurs Lungs: CTA bilaterally, no wheezes, rhonchi, or rales Abdomen: +bs, soft, non tender, non distended, no masses, no hepatomegaly, no splenomegaly Musculoskeletal: nontender, no swelling, no obvious deformity Extremities: no edema, no cyanosis, no clubbing Pulses: 2+ symmetric, upper and lower extremities, normal cap refill Neurological: alert, oriented x 3, CN2-12 intact, strength normal upper extremities and lower extremities, sensation normal throughout, DTRs 2+ throughout, no cerebellar signs, gait normal Psychiatric: normal affect, depressed/anxious affect, pleasant   Medicare Attestation I have personally reviewed: The patient's medical and social history Their use of alcohol, tobacco or illicit drugs Their current medications and supplements The patient's functional ability including ADLs,fall risks, home safety risks, cognitive, and hearing and visual impairment Diet and physical activities Evidence for depression or mood disorders  The patient's weight, height, BMI, and visual acuity  have been recorded in the chart.  I have made referrals, counseling, and provided education to the patient based on review of the above and I have provided the patient with a written personalized care plan for preventive services.     Izora Ribas, NP   01/06/2018

## 2018-01-06 ENCOUNTER — Ambulatory Visit (INDEPENDENT_AMBULATORY_CARE_PROVIDER_SITE_OTHER): Payer: Medicare Other | Admitting: Adult Health

## 2018-01-06 ENCOUNTER — Encounter: Payer: Self-pay | Admitting: Adult Health

## 2018-01-06 VITALS — BP 94/58 | HR 76 | Temp 97.5°F | Ht 64.0 in | Wt 130.0 lb

## 2018-01-06 DIAGNOSIS — D6851 Activated protein C resistance: Secondary | ICD-10-CM

## 2018-01-06 DIAGNOSIS — Z0001 Encounter for general adult medical examination with abnormal findings: Secondary | ICD-10-CM

## 2018-01-06 DIAGNOSIS — G4700 Insomnia, unspecified: Secondary | ICD-10-CM

## 2018-01-06 DIAGNOSIS — M2021 Hallux rigidus, right foot: Secondary | ICD-10-CM

## 2018-01-06 DIAGNOSIS — F331 Major depressive disorder, recurrent, moderate: Secondary | ICD-10-CM

## 2018-01-06 DIAGNOSIS — R6889 Other general symptoms and signs: Secondary | ICD-10-CM

## 2018-01-06 DIAGNOSIS — Z79899 Other long term (current) drug therapy: Secondary | ICD-10-CM

## 2018-01-06 DIAGNOSIS — E559 Vitamin D deficiency, unspecified: Secondary | ICD-10-CM

## 2018-01-06 DIAGNOSIS — N182 Chronic kidney disease, stage 2 (mild): Secondary | ICD-10-CM

## 2018-01-06 DIAGNOSIS — K5190 Ulcerative colitis, unspecified, without complications: Secondary | ICD-10-CM

## 2018-01-06 DIAGNOSIS — I10 Essential (primary) hypertension: Secondary | ICD-10-CM

## 2018-01-06 DIAGNOSIS — Z0000 Encounter for general adult medical examination without abnormal findings: Secondary | ICD-10-CM

## 2018-01-06 DIAGNOSIS — M5020 Other cervical disc displacement, unspecified cervical region: Secondary | ICD-10-CM

## 2018-01-06 DIAGNOSIS — E782 Mixed hyperlipidemia: Secondary | ICD-10-CM

## 2018-01-06 DIAGNOSIS — R7309 Other abnormal glucose: Secondary | ICD-10-CM

## 2018-01-06 DIAGNOSIS — M5136 Other intervertebral disc degeneration, lumbar region: Secondary | ICD-10-CM | POA: Diagnosis not present

## 2018-01-06 DIAGNOSIS — R269 Unspecified abnormalities of gait and mobility: Secondary | ICD-10-CM

## 2018-01-06 DIAGNOSIS — K219 Gastro-esophageal reflux disease without esophagitis: Secondary | ICD-10-CM | POA: Diagnosis not present

## 2018-01-06 DIAGNOSIS — K811 Chronic cholecystitis: Secondary | ICD-10-CM

## 2018-01-06 MED ORDER — SOLIFENACIN SUCCINATE 10 MG PO TABS: ORAL_TABLET | ORAL | 0 refills | Status: DC

## 2018-01-06 MED ORDER — ESCITALOPRAM OXALATE 20 MG PO TABS: 10.0000 mg | ORAL_TABLET | Freq: Every day | ORAL | 2 refills | Status: DC

## 2018-01-06 NOTE — Patient Instructions (Signed)
Stop wellbutrin, start lexapro 1/2 tab - 10 mg daily    Escitalopram tablets What is this medicine? ESCITALOPRAM (es sye TAL oh pram) is used to treat depression and certain types of anxiety. This medicine may be used for other purposes; ask your health care provider or pharmacist if you have questions. COMMON BRAND NAME(S): Lexapro What should I tell my health care provider before I take this medicine? They need to know if you have any of these conditions: -bipolar disorder or a family history of bipolar disorder -diabetes -glaucoma -heart disease -kidney or liver disease -receiving electroconvulsive therapy -seizures (convulsions) -suicidal thoughts, plans, or attempt by you or a family member -an unusual or allergic reaction to escitalopram, the related drug citalopram, other medicines, foods, dyes, or preservatives -pregnant or trying to become pregnant -breast-feeding How should I use this medicine? Take this medicine by mouth with a glass of water. Follow the directions on the prescription label. You can take it with or without food. If it upsets your stomach, take it with food. Take your medicine at regular intervals. Do not take it more often than directed. Do not stop taking this medicine suddenly except upon the advice of your doctor. Stopping this medicine too quickly may cause serious side effects or your condition may worsen. A special MedGuide will be given to you by the pharmacist with each prescription and refill. Be sure to read this information carefully each time. Talk to your pediatrician regarding the use of this medicine in children. Special care may be needed. Overdosage: If you think you have taken too much of this medicine contact a poison control center or emergency room at once. NOTE: This medicine is only for you. Do not share this medicine with others. What if I miss a dose? If you miss a dose, take it as soon as you can. If it is almost time for your next  dose, take only that dose. Do not take double or extra doses. What may interact with this medicine? Do not take this medicine with any of the following medications: -certain medicines for fungal infections like fluconazole, itraconazole, ketoconazole, posaconazole, voriconazole -cisapride -citalopram -dofetilide -dronedarone -linezolid -MAOIs like Carbex, Eldepryl, Marplan, Nardil, and Parnate -methylene blue (injected into a vein) -pimozide -thioridazine -ziprasidone This medicine may also interact with the following medications: -alcohol -amphetamines -aspirin and aspirin-like medicines -carbamazepine -certain medicines for depression, anxiety, or psychotic disturbances -certain medicines for migraine headache like almotriptan, eletriptan, frovatriptan, naratriptan, rizatriptan, sumatriptan, zolmitriptan -certain medicines for sleep -certain medicines that treat or prevent blood clots like warfarin, enoxaparin, dalteparin -cimetidine -diuretics -fentanyl -furazolidone -isoniazid -lithium -metoprolol -NSAIDs, medicines for pain and inflammation, like ibuprofen or naproxen -other medicines that prolong the QT interval (cause an abnormal heart rhythm) -procarbazine -rasagiline -supplements like St. John's wort, kava kava, valerian -tramadol -tryptophan This list may not describe all possible interactions. Give your health care provider a list of all the medicines, herbs, non-prescription drugs, or dietary supplements you use. Also tell them if you smoke, drink alcohol, or use illegal drugs. Some items may interact with your medicine. What should I watch for while using this medicine? Tell your doctor if your symptoms do not get better or if they get worse. Visit your doctor or health care professional for regular checks on your progress. Because it may take several weeks to see the full effects of this medicine, it is important to continue your treatment as prescribed by your  doctor. Patients and their families should watch out  for new or worsening thoughts of suicide or depression. Also watch out for sudden changes in feelings such as feeling anxious, agitated, panicky, irritable, hostile, aggressive, impulsive, severely restless, overly excited and hyperactive, or not being able to sleep. If this happens, especially at the beginning of treatment or after a change in dose, call your health care professional. Dennis Bast may get drowsy or dizzy. Do not drive, use machinery, or do anything that needs mental alertness until you know how this medicine affects you. Do not stand or sit up quickly, especially if you are an older patient. This reduces the risk of dizzy or fainting spells. Alcohol may interfere with the effect of this medicine. Avoid alcoholic drinks. Your mouth may get dry. Chewing sugarless gum or sucking hard candy, and drinking plenty of water may help. Contact your doctor if the problem does not go away or is severe. What side effects may I notice from receiving this medicine? Side effects that you should report to your doctor or health care professional as soon as possible: -allergic reactions like skin rash, itching or hives, swelling of the face, lips, or tongue -anxious -black, tarry stools -changes in vision -confusion -elevated mood, decreased need for sleep, racing thoughts, impulsive behavior -eye pain -fast, irregular heartbeat -feeling faint or lightheaded, falls -feeling agitated, angry, or irritable -hallucination, loss of contact with reality -loss of balance or coordination -loss of memory -painful or prolonged erections -restlessness, pacing, inability to keep still -seizures -stiff muscles -suicidal thoughts or other mood changes -trouble sleeping -unusual bleeding or bruising -unusually weak or tired -vomiting Side effects that usually do not require medical attention (report to your doctor or health care professional if they continue or  are bothersome): -changes in appetite -change in sex drive or performance -headache -increased sweating -indigestion, nausea -tremors This list may not describe all possible side effects. Call your doctor for medical advice about side effects. You may report side effects to FDA at 1-800-FDA-1088. Where should I keep my medicine? Keep out of reach of children. Store at room temperature between 15 and 30 degrees C (59 and 86 degrees F). Throw away any unused medicine after the expiration date. NOTE: This sheet is a summary. It may not cover all possible information. If you have questions about this medicine, talk to your doctor, pharmacist, or health care provider.  2018 Elsevier/Gold Standard (2015-12-16 13:20:23)    Please be aware that some of the medications that you are on can sometimes cause a rare and potentially dangerous adverse reaction, called SEROTONIN SYNDROME: Symptoms of this condition include (but are not limited to):  Agitation or restlessness, confusion, rapid heart rate and high blood pressure, dilated pupils, loss of muscle coordination or twitching muscles, muscle rigidity/stiffness, sweating and/or flushing, diarrhea, headache, shivering, goose bumps. If you have any of these symptoms you may have to stop the medication. Call your health care provider immediately.  Severe serotonin syndrome can be life-threatening emergency. Signs and symptoms of a severe reaction may include: high fever, seizures, irregular heartbeat, unconsciousness or altered level of awareness or personality changes.  If you have any of these new symptoms, call 911 or have someone take you to the emergency room.

## 2018-01-07 ENCOUNTER — Other Ambulatory Visit: Payer: Self-pay | Admitting: Adult Health

## 2018-01-07 DIAGNOSIS — E875 Hyperkalemia: Secondary | ICD-10-CM

## 2018-01-07 LAB — CBC WITH DIFFERENTIAL/PLATELET
BASOS PCT: 0.5 %
Basophils Absolute: 62 cells/uL (ref 0–200)
Eosinophils Absolute: 197 cells/uL (ref 15–500)
Eosinophils Relative: 1.6 %
HEMATOCRIT: 31.9 % — AB (ref 35.0–45.0)
HEMOGLOBIN: 11 g/dL — AB (ref 11.7–15.5)
LYMPHS ABS: 2066 {cells}/uL (ref 850–3900)
MCH: 31 pg (ref 27.0–33.0)
MCHC: 34.5 g/dL (ref 32.0–36.0)
MCV: 89.9 fL (ref 80.0–100.0)
MPV: 9.2 fL (ref 7.5–12.5)
Monocytes Relative: 9.2 %
NEUTROS ABS: 8844 {cells}/uL — AB (ref 1500–7800)
NEUTROS PCT: 71.9 %
Platelets: 280 10*3/uL (ref 140–400)
RBC: 3.55 10*6/uL — ABNORMAL LOW (ref 3.80–5.10)
RDW: 12.3 % (ref 11.0–15.0)
Total Lymphocyte: 16.8 %
WBC: 12.3 10*3/uL — AB (ref 3.8–10.8)
WBCMIX: 1132 {cells}/uL — AB (ref 200–950)

## 2018-01-07 LAB — MAGNESIUM: Magnesium: 1.8 mg/dL (ref 1.5–2.5)

## 2018-01-07 LAB — COMPLETE METABOLIC PANEL WITH GFR
AG RATIO: 2.3 (calc) (ref 1.0–2.5)
ALBUMIN MSPROF: 4.5 g/dL (ref 3.6–5.1)
ALT: 10 U/L (ref 6–29)
AST: 17 U/L (ref 10–35)
Alkaline phosphatase (APISO): 54 U/L (ref 33–130)
BUN: 21 mg/dL (ref 7–25)
CO2: 30 mmol/L (ref 20–32)
Calcium: 10.4 mg/dL (ref 8.6–10.4)
Chloride: 101 mmol/L (ref 98–110)
Creat: 0.84 mg/dL (ref 0.50–0.99)
GFR, EST AFRICAN AMERICAN: 83 mL/min/{1.73_m2} (ref >=60)
GFR, EST NON AFRICAN AMERICAN: 71 mL/min/{1.73_m2} (ref >=60)
GLOBULIN: 2 g/dL (ref 1.9–3.7)
Glucose, Bld: 92 mg/dL (ref 65–99)
POTASSIUM: 6 mmol/L — AB (ref 3.5–5.3)
SODIUM: 139 mmol/L (ref 135–146)
TOTAL PROTEIN: 6.5 g/dL (ref 6.1–8.1)
Total Bilirubin: 0.3 mg/dL (ref 0.2–1.2)

## 2018-01-07 LAB — LIPID PANEL
CHOL/HDL RATIO: 2 (calc) (ref <5.0)
Cholesterol: 190 mg/dL (ref <200)
HDL: 97 mg/dL (ref >50)
LDL CHOLESTEROL (CALC): 73 mg/dL
NON-HDL CHOLESTEROL (CALC): 93 mg/dL (ref <130)
Triglycerides: 122 mg/dL (ref <150)

## 2018-01-07 LAB — TSH: TSH: 2.53 mIU/L (ref 0.40–4.50)

## 2018-01-08 ENCOUNTER — Encounter: Payer: Self-pay | Admitting: Adult Health

## 2018-01-08 ENCOUNTER — Other Ambulatory Visit: Payer: Self-pay | Admitting: Internal Medicine

## 2018-01-08 DIAGNOSIS — F5101 Primary insomnia: Secondary | ICD-10-CM

## 2018-01-09 ENCOUNTER — Encounter: Payer: Self-pay | Admitting: Internal Medicine

## 2018-01-10 ENCOUNTER — Other Ambulatory Visit: Payer: Medicare Other

## 2018-01-10 DIAGNOSIS — E875 Hyperkalemia: Secondary | ICD-10-CM

## 2018-01-10 LAB — BASIC METABOLIC PANEL WITH GFR
BUN: 17 mg/dL (ref 7–25)
CALCIUM: 10.2 mg/dL (ref 8.6–10.4)
CHLORIDE: 98 mmol/L (ref 98–110)
CO2: 29 mmol/L (ref 20–32)
Creat: 0.81 mg/dL (ref 0.50–0.99)
GFR, EST AFRICAN AMERICAN: 86 mL/min/{1.73_m2} (ref >=60)
GFR, EST NON AFRICAN AMERICAN: 75 mL/min/{1.73_m2} (ref >=60)
Glucose, Bld: 109 mg/dL — ABNORMAL HIGH (ref 65–99)
Potassium: 5.5 mmol/L — ABNORMAL HIGH (ref 3.5–5.3)
Sodium: 139 mmol/L (ref 135–146)

## 2018-01-12 ENCOUNTER — Other Ambulatory Visit: Payer: Self-pay

## 2018-01-12 ENCOUNTER — Encounter: Payer: Self-pay | Admitting: Internal Medicine

## 2018-01-12 DIAGNOSIS — M1712 Unilateral primary osteoarthritis, left knee: Secondary | ICD-10-CM | POA: Diagnosis not present

## 2018-01-12 MED ORDER — OXYBUTYNIN CHLORIDE 5 MG PO TABS: ORAL_TABLET | ORAL | 0 refills | Status: DC

## 2018-01-12 NOTE — Telephone Encounter (Signed)
Refill request for Oxybutynin.

## 2018-01-19 DIAGNOSIS — K219 Gastro-esophageal reflux disease without esophagitis: Secondary | ICD-10-CM | POA: Diagnosis not present

## 2018-01-24 ENCOUNTER — Other Ambulatory Visit: Payer: Self-pay | Admitting: Internal Medicine

## 2018-02-01 ENCOUNTER — Other Ambulatory Visit: Payer: Self-pay | Admitting: Obstetrics and Gynecology

## 2018-02-01 DIAGNOSIS — Z1231 Encounter for screening mammogram for malignant neoplasm of breast: Secondary | ICD-10-CM

## 2018-02-05 ENCOUNTER — Encounter: Payer: Self-pay | Admitting: Internal Medicine

## 2018-02-05 ENCOUNTER — Other Ambulatory Visit: Payer: Self-pay | Admitting: Internal Medicine

## 2018-02-05 MED ORDER — DULOXETINE HCL 60 MG PO CPEP: ORAL_CAPSULE | ORAL | 1 refills | Status: DC

## 2018-02-09 ENCOUNTER — Encounter: Payer: Self-pay | Admitting: Internal Medicine

## 2018-02-10 ENCOUNTER — Encounter: Payer: Self-pay | Admitting: Internal Medicine

## 2018-02-14 DIAGNOSIS — K219 Gastro-esophageal reflux disease without esophagitis: Secondary | ICD-10-CM | POA: Diagnosis not present

## 2018-02-22 ENCOUNTER — Ambulatory Visit
Admission: RE | Admit: 2018-02-22 | Discharge: 2018-02-22 | Disposition: A | Discharge status: Home / Self Care | Payer: Medicare Other | Source: Ambulatory Visit | Attending: Obstetrics and Gynecology | Admitting: Obstetrics and Gynecology

## 2018-02-22 DIAGNOSIS — Z1231 Encounter for screening mammogram for malignant neoplasm of breast: Secondary | ICD-10-CM

## 2018-03-09 ENCOUNTER — Other Ambulatory Visit: Payer: Self-pay | Admitting: Internal Medicine

## 2018-03-09 MED ORDER — SULFASALAZINE 500 MG PO TABS: ORAL_TABLET | ORAL | 1 refills | Status: DC

## 2018-03-29 ENCOUNTER — Other Ambulatory Visit: Payer: Self-pay | Admitting: Internal Medicine

## 2018-04-03 ENCOUNTER — Other Ambulatory Visit: Payer: Self-pay | Admitting: Internal Medicine

## 2018-04-03 MED ORDER — ESOMEPRAZOLE MAGNESIUM 40 MG PO CPDR: 40.0000 mg | DELAYED_RELEASE_CAPSULE | Freq: Every day | ORAL | 1 refills | Status: DC

## 2018-04-21 ENCOUNTER — Encounter: Payer: Self-pay | Admitting: Internal Medicine

## 2018-04-21 DIAGNOSIS — H2513 Age-related nuclear cataract, bilateral: Secondary | ICD-10-CM | POA: Diagnosis not present

## 2018-04-21 DIAGNOSIS — Z87891 Personal history of nicotine dependence: Secondary | ICD-10-CM | POA: Insufficient documentation

## 2018-04-21 DIAGNOSIS — Z8249 Family history of ischemic heart disease and other diseases of the circulatory system: Secondary | ICD-10-CM | POA: Insufficient documentation

## 2018-04-21 DIAGNOSIS — H04123 Dry eye syndrome of bilateral lacrimal glands: Secondary | ICD-10-CM | POA: Diagnosis not present

## 2018-04-21 DIAGNOSIS — H43813 Vitreous degeneration, bilateral: Secondary | ICD-10-CM | POA: Diagnosis not present

## 2018-04-21 DIAGNOSIS — H35372 Puckering of macula, left eye: Secondary | ICD-10-CM | POA: Diagnosis not present

## 2018-04-21 NOTE — Progress Notes (Signed)
This very nice 67 y.o. MBF  presents for 6 month follow up with HTN, HLD, Pre-Diabetes and Vitamin D Deficiency. She also has chronic Low Back & neck pain due to DDD & is s/p ACD/Cx Fusion. . She also has GERD. Controlled on current meds.       Patient is followed by Dr Collene Mares for Ulcerative  Colitis. Patient has difficulty affording her meds for her colitis and is attempting control with diet & prn Imodium. Last colonoscopy was in Nov 2018 and had inadequate prep and she was advised 1 month f/u colon with a 10 day prep which patient has deferred due to difficulty arranging transportation home after the procedure.      Patient is treated for HTN circa 1997 & BP has been controlled at home. Today's BP is at goal -  106/64. Patient has had no complaints of any cardiac type chest pain, palpitations, dyspnea / orthopnea / PND, dizziness, claudication, or dependent edema.     Hyperlipidemia is controlled with diet & meds. Patient denies myalgias or other med SE's. Last Lipids were at goal: Lab Results  Component Value Date   CHOL 190 01/06/2018   HDL 97 01/06/2018   LDLCALC 73 01/06/2018   TRIG 122 01/06/2018   CHOLHDL 2.0 01/06/2018      Also, the patient has history of PreDiabetes  (A1c 5.7%/2013) and has had no symptoms of reactive hypoglycemia, diabetic polys, paresthesias or visual blurring.  Last A1c was Normal & at goal: Lab Results  Component Value Date   HGBA1C 4.9 09/30/2017      Further, the patient also has history of Vitamin D Deficiency ("28"/2008) and supplements vitamin D without any suspected side-effects. Last vitamin D was at goal:  Lab Results  Component Value Date   VD25OH 74 09/30/2017   Current Outpatient Medications on File Prior to Visit  Medication Sig  . Ascorbic Acid (VITAMIN C PO) Take 625 mg by mouth daily.  . busPIRone (BUSPAR) 30 MG tablet TAKE 2 TABLETS ONCE DAILY  . Calcium Carbonate-Vit D-Min (CALCIUM 1200 PO) Take 2,400 mg by mouth daily.  .  cholecalciferol (VITAMIN D) 1000 UNITS tablet Take 5,000 Units by mouth daily.   . Coenzyme Q10 (CO Q 10) 100 MG CAPS Take 200 mg by mouth 2 (two) times daily. Quol Ultra CoQ10 141m   . DULoxetine (CYMBALTA) 60 MG capsule TAKE 1 CAPSULE DAILY FOR   MOOD AND PAIN  . esomeprazole (NEXIUM) 40 MG capsule Take 1 capsule (40 mg total) by mouth daily.  . fexofenadine (ALLEGRA) 180 MG tablet Take 1 tablet (180 mg total) by mouth as needed for allergies.  .Marland Kitchenibandronate (BONIVA) 150 MG tablet Take 1 tablet (150 mg total) by mouth every 30 (thirty) days. Take in the morning with a full glass of water, on an empty stomach, and do not take anything else by mouth or lie down for the next 30 min.  .Marland Kitchenloperamide (IMODIUM) 2 MG capsule Take 2 mg by mouth as needed for diarrhea or loose stools.  .Marland Kitchenlosartan (COZAAR) 100 MG tablet Take 1 tablet daily for BP  . Methylcellulose, Laxative, (CITRUCEL) 500 MG TABS Take 1,000 mg by mouth 2 (two) times daily.   .Marland KitchenOVER THE COUNTER MEDICATION Take 300 mg by mouth daily. Mega red 3085m . oxybutynin (DITROPAN) 5 MG tablet Take 1/2 to 1 tablet 2 to 3 x / day for Bladder  . prednisoLONE acetate (PRED FORTE) 1 %  ophthalmic suspension Place 1 drop into both eyes 4 (four) times daily.  . Red Yeast Rice Extract 600 MG TABS Take 1,200 mg by mouth 2 (two) times daily.  . traZODone (DESYREL) 100 MG tablet TAKE 1 TO 2 TABLETS 1 HOUR BEFORE SLEEP   No current facility-administered medications on file prior to visit.    Allergies  Allergen Reactions  . Meloxicam Other (See Comments)    Intestinal Bleed  . Augmentin [Amoxicillin-Pot Clavulanate] Diarrhea  . Azulfidine [Sulfasalazine] Nausea And Vomiting    N/V & HA's  . Lyrica [Pregabalin] Swelling    Swelling of feet.  . Other Other (See Comments)    Salt-sensitivity Grain-sensitivity Artificial sweetners-sensitivity Sugar-sensitivity  . Wheat Bran Other (See Comments)    Wheat-sensitivity  . Lactose Intolerance (Gi) Other  (See Comments)    unknown  . Quinapril Other (See Comments)    unknown  . Vasotec [Enalaprilat] Cough   PMHx:   Past Medical History:  Diagnosis Date  . Adult celiac disease   . Concussion    fell jumping off of horse in past 6 months- 09/14/12  . DDD (degenerative disc disease)   . Diverticulitis   . DJD (degenerative joint disease)   . Factor V Leiden ([place])   . Fibromyalgia   . Fusion of spine of lumbar region t  . GERD (gastroesophageal reflux disease)   . History of fusion of cervical spine t  . Hyperlipidemia   . Hypertension   . Lactose intolerance   . Proctitis   . Scoliosis   . Spinal stenosis   . Ulcerative colitis ([place])   . Vitamin D deficiency    Immunization History  Administered Date(s) Administered  . Influenza-Unspecified 04/26/2014, 08/03/2017  . Pneumococcal Conjugate-13 08/03/2017  . Pneumococcal Polysaccharide-23 09/26/2012  . Td 01/22/2016   Past Surgical History:  Procedure Laterality Date  . ANTERIOR CERVICAL DECOMP/DISCECTOMY FUSION N/A 02/12/2017   Procedure: C3-4 C4-5 Anterior cervical decompression/discectomy/fusion/exploration and removal of plate at C5 to C7;  Surgeon: Erline Levine, MD;  Location: [facility];  Service: Neurosurgery;  Laterality: N/A;  C3-4 C4-5 Anterior cervical decompression/discectomy/fusion/exploration and removal of plate at C5 to C7  . ANTERIOR LATERAL LUMBAR FUSION 4 LEVELS N/A 09/22/2012   Procedure: ANTERIOR LATERAL LUMBAR FUSION 4 LEVELS;  Surgeon: Erline Levine, MD;  Location: [facility];  Service: Neurosurgery;  Laterality: N/A;  Thoracic Twelve Lumbar One,Lumbar One-TwoAnterolateral decompression/fusion/percutaneous percutaneous pedicle screws   . BACK SURGERY  2006, 2008   . disectomy  . CHEST TUBE INSERTION Left 09/22/2012   Procedure: CHEST TUBE INSERTION;  Surgeon: Erline Levine, MD;  Location: [facility];  Service: Neurosurgery;  Laterality: Left;  Insertion per Dr. Cyndia Bent  . CHOLECYSTECTOMY N/A 11/22/2013    Procedure: LAPAROSCOPIC CHOLECYSTECTOMY ;  Surgeon: Harl Bowie, MD;  Location: [facility];  Service: General;  Laterality: N/A;  . EYE SURGERY    . FOOT SURGERY Right 1978   Reconstruction after faliure of emergency stitching  . LASIK    . NECK SURGERY  2002   anterior cervical fusion  . TUBAL LIGATION  1983   FHx:    Reviewed / unchanged  SHx:    Reviewed / unchanged   Systems Review:  Constitutional: Denies fever, chills, wt changes, headaches, insomnia, fatigue, night sweats, change in appetite. Eyes: Denies redness, blurred vision, diplopia, discharge, itchy, watery eyes.  ENT: Denies discharge, congestion, post nasal drip, epistaxis, sore throat, earache, hearing loss, dental pain, tinnitus, vertigo, sinus pain, snoring.  CV: Denies chest pain, palpitations, irregular heartbeat, syncope, dyspnea, diaphoresis, orthopnea, PND, claudication or edema. Respiratory: denies cough, dyspnea, DOE, pleurisy, hoarseness, laryngitis, wheezing.  Gastrointestinal: Denies dysphagia, odynophagia, heartburn, reflux, water brash, abdominal pain or cramps, nausea, vomiting, bloating, diarrhea, constipation, hematemesis, melena, hematochezia  or hemorrhoids. Genitourinary: Denies dysuria, frequency, urgency, nocturia, hesitancy, discharge, hematuria or flank pain. Musculoskeletal: Denies arthralgias, myalgias, stiffness, jt. swelling, pain, limping or strain/sprain.  Skin: Denies pruritus, rash, hives, warts, acne, eczema or change in skin lesion(s). Neuro: No weakness, tremor, incoordination, spasms, paresthesia or pain. Psychiatric: Denies confusion, memory loss or sensory loss. Endo: Denies change in weight, skin or hair change.  Heme/Lymph: No excessive bleeding, bruising or enlarged lymph nodes.  Physical Exam  BP 106/64   Pulse 76   Temp (!) 97.3 F (36.3 C)   Resp 16   Ht 5' 4"  (1.626 m)   Wt 130 lb 3.2 oz (59.1 kg)   BMI 22.35 kg/m   Appears  well nourished, well groomed  and in  no distress.  Eyes: PERRLA, EOMs, conjunctiva no swelling or erythema. Sinuses: No frontal/maxillary tenderness ENT/Mouth: EAC's clear, TM's nl w/o erythema, bulging. Nares clear w/o erythema, swelling, exudates. Oropharynx clear without erythema or exudates. Oral hygiene is good. Tongue normal, non obstructing. Hearing intact.  Neck: Supple. Thyroid not palpable. Car 2+/2+ without bruits, nodes or JVD. Chest: Respirations nl with BS clear & equal w/o rales, rhonchi, wheezing or stridor.  Cor: Heart sounds normal w/ regular rate and rhythm without sig. murmurs, gallops, clicks or rubs. Peripheral pulses normal and equal  without edema.  Abdomen: Soft & bowel sounds normal. Non-tender w/o guarding, rebound, hernias, masses or organomegaly.  Lymphatics: Unremarkable.  Musculoskeletal: Full ROM all peripheral extremities, joint stability, 5/5 strength and normal gait.  Skin: Warm, dry without exposed rashes, lesions or ecchymosis apparent.  Neuro: Cranial nerves intact, reflexes equal bilaterally. Sensory-motor testing grossly intact. Tendon reflexes grossly intact.  Pysch: Alert & oriented x 3.  Insight and judgement nl & appropriate. No ideations.  Assessment and Plan:  - Continue medication, monitor blood pressure at home.  - Continue DASH diet.  Reminder to go to the ER if any CP,  SOB, nausea, dizziness, severe HA, changes vision/speech.  - Continue diet/meds, exercise,& lifestyle modifications.  - Continue monitor periodic cholesterol/liver & renal functions   - Continue diet, exercise, lifestyle modifications.  - Monitor appropriate labs. - Continue supplementation.      Discussed  regular exercise, BP monitoring, weight control to achieve/maintain BMI less than 25 and discussed med and SE's. Recommended labs to assess and monitor clinical status with further disposition pending results of labs. Over 30 minutes of exam, counseling, chart review was performed.

## 2018-04-21 NOTE — Patient Instructions (Signed)

## 2018-04-22 ENCOUNTER — Ambulatory Visit (INDEPENDENT_AMBULATORY_CARE_PROVIDER_SITE_OTHER): Payer: Medicare Other | Admitting: Internal Medicine

## 2018-04-22 ENCOUNTER — Encounter: Payer: Self-pay | Admitting: Internal Medicine

## 2018-04-22 VITALS — BP 106/64 | HR 76 | Temp 97.3°F | Resp 16 | Ht 64.0 in | Wt 130.2 lb

## 2018-04-22 DIAGNOSIS — E559 Vitamin D deficiency, unspecified: Secondary | ICD-10-CM | POA: Diagnosis not present

## 2018-04-22 DIAGNOSIS — R7309 Other abnormal glucose: Secondary | ICD-10-CM | POA: Diagnosis not present

## 2018-04-22 DIAGNOSIS — K51819 Other ulcerative colitis with unspecified complications: Secondary | ICD-10-CM | POA: Diagnosis not present

## 2018-04-22 DIAGNOSIS — I10 Essential (primary) hypertension: Secondary | ICD-10-CM

## 2018-04-22 DIAGNOSIS — Z23 Encounter for immunization: Secondary | ICD-10-CM

## 2018-04-22 DIAGNOSIS — K219 Gastro-esophageal reflux disease without esophagitis: Secondary | ICD-10-CM | POA: Diagnosis not present

## 2018-04-22 DIAGNOSIS — E782 Mixed hyperlipidemia: Secondary | ICD-10-CM | POA: Diagnosis not present

## 2018-04-22 DIAGNOSIS — R7303 Prediabetes: Secondary | ICD-10-CM | POA: Diagnosis not present

## 2018-04-22 DIAGNOSIS — Z79899 Other long term (current) drug therapy: Secondary | ICD-10-CM | POA: Diagnosis not present

## 2018-04-23 ENCOUNTER — Encounter: Payer: Self-pay | Admitting: Internal Medicine

## 2018-04-25 LAB — CBC WITH DIFFERENTIAL/PLATELET
Basophils Absolute: 75 cells/uL (ref 0–200)
Basophils Relative: 0.6 %
Eosinophils Absolute: 100 cells/uL (ref 15–500)
Eosinophils Relative: 0.8 %
HCT: 34 % — ABNORMAL LOW (ref 35.0–45.0)
Hemoglobin: 11.4 g/dL — ABNORMAL LOW (ref 11.7–15.5)
Lymphs Abs: 3775 cells/uL (ref 850–3900)
MCH: 30.6 pg (ref 27.0–33.0)
MCHC: 33.5 g/dL (ref 32.0–36.0)
MCV: 91.2 fL (ref 80.0–100.0)
MPV: 9.2 fL (ref 7.5–12.5)
Monocytes Relative: 13.9 %
NEUTROS PCT: 54.5 %
Neutro Abs: 6813 cells/uL (ref 1500–7800)
PLATELETS: 316 10*3/uL (ref 140–400)
RBC: 3.73 10*6/uL — AB (ref 3.80–5.10)
RDW: 12.7 % (ref 11.0–15.0)
TOTAL LYMPHOCYTE: 30.2 %
WBC: 12.5 10*3/uL — AB (ref 3.8–10.8)
WBCMIX: 1738 {cells}/uL — AB (ref 200–950)

## 2018-04-25 LAB — COMPLETE METABOLIC PANEL WITH GFR
AG RATIO: 1.9 (calc) (ref 1.0–2.5)
ALKALINE PHOSPHATASE (APISO): 54 U/L (ref 33–130)
ALT: 9 U/L (ref 6–29)
AST: 17 U/L (ref 10–35)
Albumin: 4.6 g/dL (ref 3.6–5.1)
BILIRUBIN TOTAL: 0.4 mg/dL (ref 0.2–1.2)
BUN: 18 mg/dL (ref 7–25)
CALCIUM: 10.1 mg/dL (ref 8.6–10.4)
CHLORIDE: 100 mmol/L (ref 98–110)
CO2: 32 mmol/L (ref 20–32)
Creat: 0.86 mg/dL (ref 0.50–0.99)
GFR, Est African American: 80 mL/min/{1.73_m2} (ref >=60)
GFR, Est Non African American: 69 mL/min/{1.73_m2} (ref >=60)
Globulin: 2.4 g/dL (calc) (ref 1.9–3.7)
Glucose, Bld: 83 mg/dL (ref 65–99)
POTASSIUM: 4.6 mmol/L (ref 3.5–5.3)
Sodium: 139 mmol/L (ref 135–146)
Total Protein: 7 g/dL (ref 6.1–8.1)

## 2018-04-25 LAB — MAGNESIUM: MAGNESIUM: 2.1 mg/dL (ref 1.5–2.5)

## 2018-04-25 LAB — HEMOGLOBIN A1C
HEMOGLOBIN A1C: 5.3 %{Hb} (ref <5.7)
MEAN PLASMA GLUCOSE: 105 (calc)
eAG (mmol/L): 5.8 (calc)

## 2018-04-25 LAB — LIPID PANEL
CHOLESTEROL: 255 mg/dL — AB (ref <200)
HDL: 153 mg/dL (ref >50)
LDL Cholesterol (Calc): 77 mg/dL (calc)
Non-HDL Cholesterol (Calc): 102 mg/dL (calc) (ref <130)
Total CHOL/HDL Ratio: 1.7 (calc) (ref <5.0)
Triglycerides: 148 mg/dL (ref <150)

## 2018-04-25 LAB — VITAMIN D 25 HYDROXY (VIT D DEFICIENCY, FRACTURES): Vit D, 25-Hydroxy: 63 ng/mL (ref 30–100)

## 2018-04-25 LAB — TSH: TSH: 2.76 mIU/L (ref 0.40–4.50)

## 2018-04-25 LAB — INSULIN, RANDOM: Insulin: 2.3 u[IU]/mL (ref 2.0–19.6)

## 2018-05-23 ENCOUNTER — Other Ambulatory Visit: Payer: Self-pay | Admitting: Internal Medicine

## 2018-05-24 ENCOUNTER — Other Ambulatory Visit: Payer: Self-pay | Admitting: *Deleted

## 2018-05-24 MED ORDER — OLMESARTAN MEDOXOMIL 40 MG PO TABS: 40.0000 mg | ORAL_TABLET | Freq: Every day | ORAL | 1 refills | Status: DC

## 2018-06-07 ENCOUNTER — Other Ambulatory Visit: Payer: Self-pay | Admitting: Adult Health

## 2018-06-09 ENCOUNTER — Other Ambulatory Visit: Payer: Self-pay | Admitting: Internal Medicine

## 2018-06-09 DIAGNOSIS — R7303 Prediabetes: Secondary | ICD-10-CM

## 2018-06-09 DIAGNOSIS — K5190 Ulcerative colitis, unspecified, without complications: Secondary | ICD-10-CM

## 2018-06-09 DIAGNOSIS — R030 Elevated blood-pressure reading, without diagnosis of hypertension: Secondary | ICD-10-CM

## 2018-06-09 DIAGNOSIS — E782 Mixed hyperlipidemia: Secondary | ICD-10-CM

## 2018-06-09 DIAGNOSIS — Z0001 Encounter for general adult medical examination with abnormal findings: Secondary | ICD-10-CM

## 2018-06-09 MED ORDER — MESALAMINE 800 MG PO TBEC: DELAYED_RELEASE_TABLET | ORAL | 3 refills | Status: DC

## 2018-06-26 IMAGING — CR DG CERVICAL SPINE 2 OR 3 VIEWS
1 series · 1 of 1 positions shown · non-contrast
Comparison: MRI 12/18/2016 .

CLINICAL DATA: Cervical decompression.

EXAM:
CERVICAL SPINE - 2-3 VIEW

[AP]
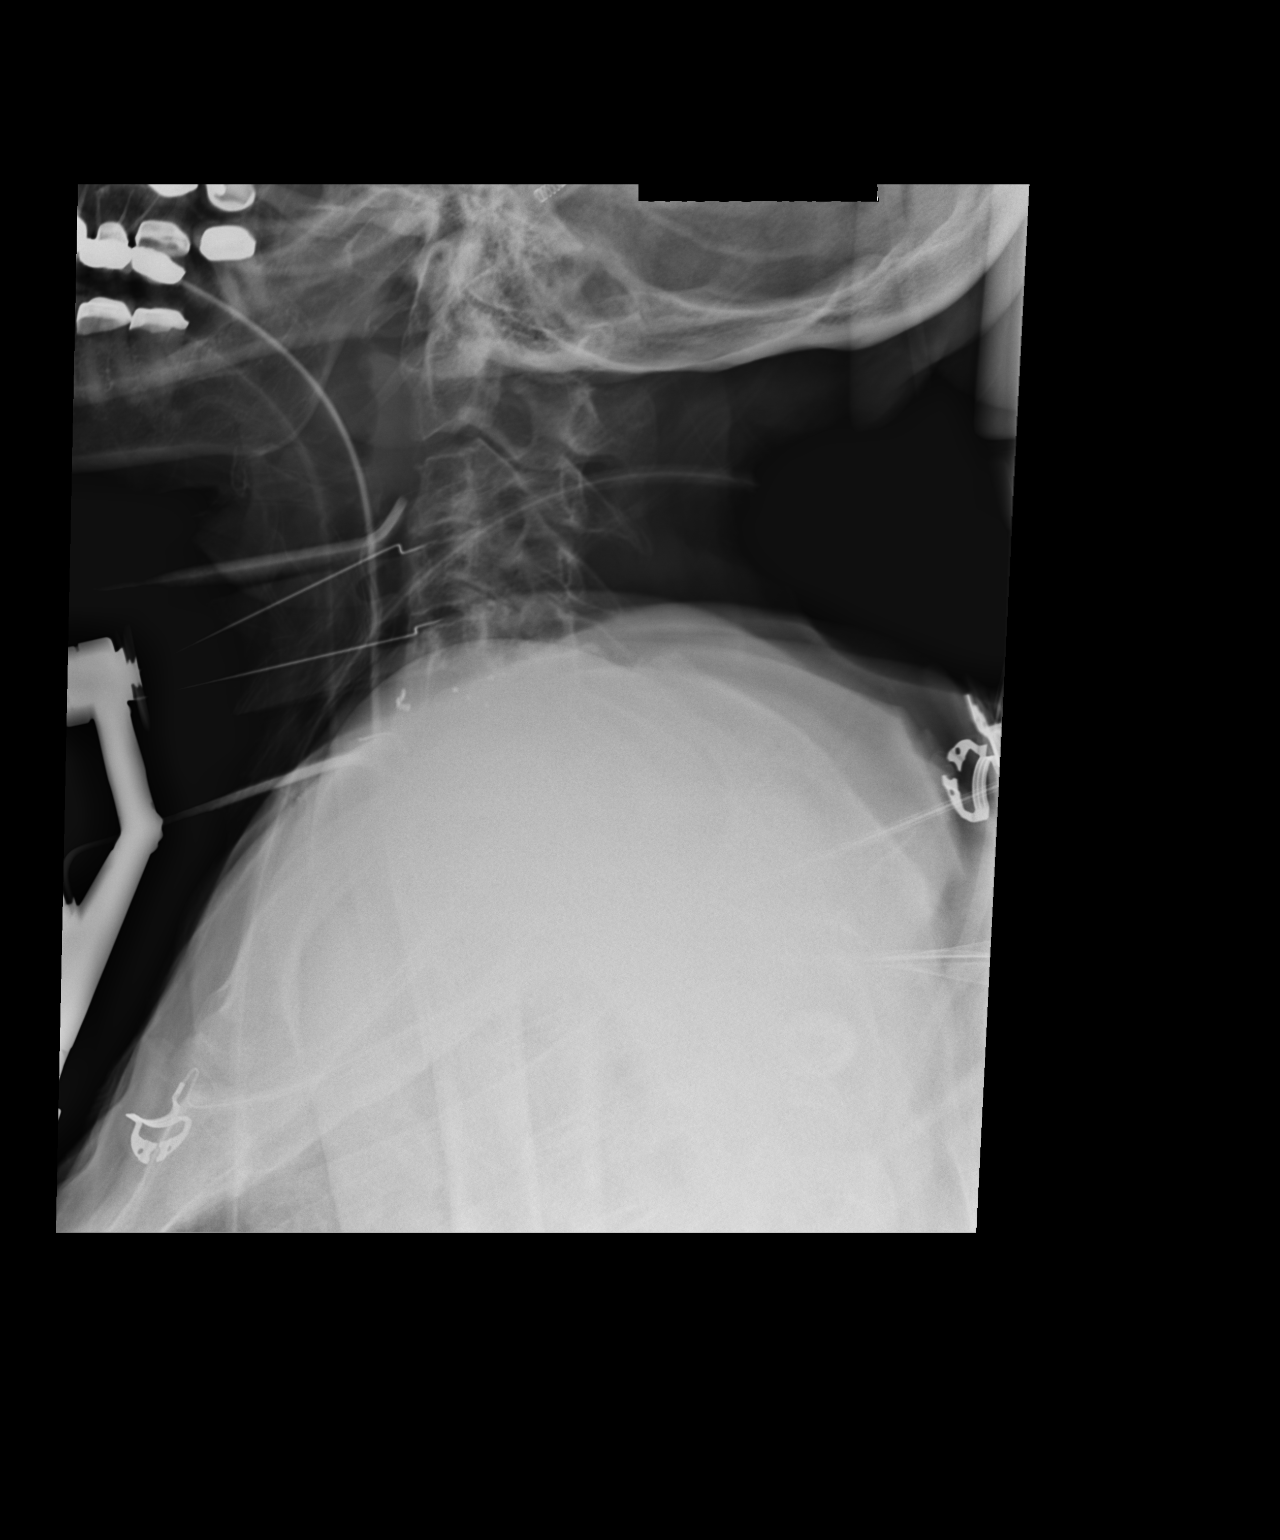

[1 of 1 positions shown; findings below may reference images not displayed]

FINDINGS: Postsurgical changes cervical spine with metallic markers noted
anteriorly at the C3-C4 and C4-C5 levels on image number 1. On image
number 2 there has been C3 through C5 anterior and interbody fusion.
Hardware intact. Anatomic alignment . Surgical sponges noted.
IMPRESSION: Postsurgical changes cervical spine.

## 2018-07-07 ENCOUNTER — Other Ambulatory Visit: Payer: Self-pay | Admitting: *Deleted

## 2018-07-07 ENCOUNTER — Other Ambulatory Visit: Payer: Self-pay | Admitting: Internal Medicine

## 2018-07-07 DIAGNOSIS — N3281 Overactive bladder: Secondary | ICD-10-CM

## 2018-07-07 MED ORDER — DULOXETINE HCL 60 MG PO CPEP: ORAL_CAPSULE | ORAL | 1 refills | Status: DC

## 2018-07-07 MED ORDER — OXYBUTYNIN CHLORIDE 5 MG PO TABS: ORAL_TABLET | ORAL | 3 refills | Status: DC

## 2018-07-31 ENCOUNTER — Other Ambulatory Visit: Payer: Self-pay | Admitting: Internal Medicine

## 2018-07-31 DIAGNOSIS — K5190 Ulcerative colitis, unspecified, without complications: Secondary | ICD-10-CM

## 2018-07-31 MED ORDER — MESALAMINE 800 MG PO TBEC: DELAYED_RELEASE_TABLET | ORAL | 3 refills | Status: DC

## 2018-08-04 ENCOUNTER — Ambulatory Visit (INDEPENDENT_AMBULATORY_CARE_PROVIDER_SITE_OTHER): Payer: Medicare Other | Admitting: Physician Assistant

## 2018-08-04 ENCOUNTER — Encounter: Payer: Self-pay | Admitting: Physician Assistant

## 2018-08-04 VITALS — BP 114/68 | HR 67 | Temp 98.1°F | Ht 64.0 in | Wt 135.4 lb

## 2018-08-04 DIAGNOSIS — K5190 Ulcerative colitis, unspecified, without complications: Secondary | ICD-10-CM

## 2018-08-04 DIAGNOSIS — D6851 Activated protein C resistance: Secondary | ICD-10-CM | POA: Diagnosis not present

## 2018-08-04 DIAGNOSIS — R7309 Other abnormal glucose: Secondary | ICD-10-CM

## 2018-08-04 DIAGNOSIS — I10 Essential (primary) hypertension: Secondary | ICD-10-CM

## 2018-08-04 DIAGNOSIS — F331 Major depressive disorder, recurrent, moderate: Secondary | ICD-10-CM | POA: Diagnosis not present

## 2018-08-04 DIAGNOSIS — E782 Mixed hyperlipidemia: Secondary | ICD-10-CM | POA: Diagnosis not present

## 2018-08-04 DIAGNOSIS — M810 Age-related osteoporosis without current pathological fracture: Secondary | ICD-10-CM | POA: Diagnosis not present

## 2018-08-04 DIAGNOSIS — Z1159 Encounter for screening for other viral diseases: Secondary | ICD-10-CM

## 2018-08-04 DIAGNOSIS — Z79899 Other long term (current) drug therapy: Secondary | ICD-10-CM

## 2018-08-04 MED ORDER — MESALAMINE 800 MG PO TBEC: DELAYED_RELEASE_TABLET | ORAL | 3 refills | Status: DC

## 2018-08-04 NOTE — Progress Notes (Signed)
Assessment and Plan:  Hypertension:  -Continue medication -monitor blood pressure at home. -Continue DASH diet -Reminder to go to the ER if any CP, SOB, nausea, dizziness, severe HA, changes vision/speech, left arm numbness and tingling and jaw pain.  Cholesterol - Continue diet and exercise -Check cholesterol.   Ulcerative pancolitis without complication (HCC) Follow up GI  CKD2 (GFR 77 ml/min)  -     BASIC METABOLIC PANEL WITH GFR  Osteoporosis Get DEXA with MGM in July ? How long she has been on boniva- need to check  Continue diet and meds as discussed. Further disposition pending results of labs. Discussed med's effects and SE's.   Future Appointments  Date Time Provider [place]  11/14/2018  3:00 PM Unk Pinto, MD [facility] None    HPI 68 y.o. female  presents for 3 month follow up with hypertension, hyperlipidemia, diabetes and vitamin D deficiency.   Her blood pressure has been controlled at home, today their BP is BP: 114/68.She does workout. She denies chest pain, shortness of breath, dizziness. She has osteoporosis last DEXA 01/2016 showed T score -5.8, will repeat this year, she has been on boniva.   She is on cholesterol medication and denies myalgias. Her cholesterol is at goal. She is RYRE The cholesterol was:  04/22/2018: Cholesterol 255; HDL 153; LDL Cholesterol (Calc) 77; Triglycerides 148   She has been working on diet and exercise for prediabetes without complications, she is on bASA, she is on ACE/ARB, and denies  foot ulcerations, hyperglycemia, hypoglycemia , increased appetite, nausea, paresthesia of the feet, polydipsia, polyuria, visual disturbances, vomiting and weight loss. Last A1C was: 04/22/2018: Hgb A1c MFr Bld 5.3   Patient is on Vitamin D supplement. 04/22/2018: Vit D, 25-Hydroxy 63   She has UC, follows with GI, has had flair recently, will take 3-4 prednisone during. Follows with Dr. Collene Mares  BMI is Body mass index is 23.24  kg/m., she is working on diet and exercise. Wt Readings from Last 3 Encounters:  08/04/18 135 lb 6.4 oz (61.4 kg)  04/22/18 130 lb 3.2 oz (59.1 kg)  01/06/18 130 lb (59 kg)    Current Medications:  Current Outpatient Medications on File Prior to Visit  Medication Sig Dispense Refill  . Ascorbic Acid (VITAMIN C PO) Take 625 mg by mouth daily.    . ASPIRIN 81 PO Take by mouth.    . busPIRone (BUSPAR) 30 MG tablet TAKE 2 TABLETS ONCE DAILY 180 tablet 3  . Calcium Carbonate-Vit D-Min (CALCIUM 1200 PO) Take 2,400 mg by mouth daily.    . cholecalciferol (VITAMIN D) 1000 UNITS tablet Take 5,000 Units by mouth daily.     . Coenzyme Q10 (CO Q 10) 100 MG CAPS Take 200 mg by mouth 2 (two) times daily. Quol Ultra CoQ10 160m     . DULoxetine (CYMBALTA) 60 MG capsule TAKE 1 CAPSULE DAILY FOR   MOOD AND PAIN 90 capsule 1  . esomeprazole (NEXIUM) 40 MG capsule Take 1 capsule (40 mg total) by mouth daily. 90 capsule 1  . ibandronate (BONIVA) 150 MG tablet Take 1 tablet (150 mg total) by mouth every 30 (thirty) days. Take in the morning with a full glass of water, on an empty stomach, and do not take anything else by mouth or lie down for the next 30 min. 12 tablet 0  . loperamide (IMODIUM) 2 MG capsule Take 2 mg by mouth as needed for diarrhea or loose stools.    . Methylcellulose, Laxative, (CITRUCEL)  500 MG TABS Take 1,000 mg by mouth 2 (two) times daily.     [name] olmesartan (BENICAR) 40 MG tablet Take 1 tablet (40 mg total) by mouth daily. 90 tablet 1  . OVER THE COUNTER MEDICATION Take 300 mg by mouth daily. Mega red 388m    . oxybutynin (DITROPAN) 5 MG tablet Take 1/2 to 1 tablet  2 to 3  x /day for Bladder 270 tablet 3  . prednisoLONE acetate (PRED FORTE) 1 % ophthalmic suspension Place 1 drop into both eyes 4 (four) times daily. 5 mL 0  . Red Yeast Rice Extract 600 MG TABS Take 1,200 mg by mouth 2 (two) times daily.    . traZODone (DESYREL) 100 MG tablet TAKE 1 TO 2 TABLETS 1 HOUR BEFORE SLEEP 180  tablet 3   No current facility-administered medications on file prior to visit.    Medical History:  Past Medical History:  Diagnosis Date  . Adult celiac disease   . Concussion    fell jumping off of horse in past 6 months- 09/14/12  . DDD (degenerative disc disease)   . Diverticulitis   . DJD (degenerative joint disease)   . [place]   . Fibromyalgia   . Fusion of spine of lumbar region t  . GERD (gastroesophageal reflux disease)   . History of fusion of cervical spine t  . Hyperlipidemia   . Hypertension   . Lactose intolerance   . Proctitis   . Scoliosis   . Spinal stenosis   . [place]   . Vitamin D deficiency    Allergies:  Allergies  Allergen Reactions  . Meloxicam Other (See Comments)    Intestinal Bleed  . Augmentin [Amoxicillin-Pot Clavulanate] Diarrhea  . Azulfidine [Sulfasalazine] Nausea And Vomiting    N/V & HA's  . Lyrica [Pregabalin] Swelling    Swelling of feet.  . Other Other (See Comments)    Salt-sensitivity Grain-sensitivity Artificial sweetners-sensitivity Sugar-sensitivity  . Wheat Bran Other (See Comments)    Wheat-sensitivity  . Lactose Intolerance (Gi) Other (See Comments)    unknown  . Quinapril Other (See Comments)    unknown  . Vasotec [Enalaprilat] Cough     Review of Systems:  Review of Systems  Constitutional: Negative for chills, fever and malaise/fatigue.  HENT: Negative.  Negative for congestion, ear pain and sore throat.   Eyes: Negative for discharge.  Respiratory: Positive for shortness of breath. Negative for cough, hemoptysis, sputum production and wheezing.   Cardiovascular: Negative for chest pain, palpitations, orthopnea, claudication, leg swelling and PND.  Gastrointestinal: Positive for blood in stool. Negative for constipation, diarrhea, heartburn and melena.  Genitourinary: Negative.   Musculoskeletal: Positive for myalgias and neck pain.  Skin: Negative.   Neurological: Positive  for sensory change and focal weakness. Negative for dizziness, loss of consciousness and headaches.  Psychiatric/Behavioral: Positive for depression. The patient is not nervous/anxious and does not have insomnia.     Family history- Review and unchanged  Social history- Review and unchanged  Physical Exam: BP 114/68   Pulse 67   Temp 98.1 F (36.7 C)   Ht 5' 4"  (1.626 m)   Wt 135 lb 6.4 oz (61.4 kg)   SpO2 97%   BMI 23.24 kg/m  Wt Readings from Last 3 Encounters:  08/04/18 135 lb 6.4 oz (61.4 kg)  04/22/18 130 lb 3.2 oz (59.1 kg)  01/06/18 130 lb (59 kg)   General Appearance: Well nourished well developed, non-toxic appearing,  in no apparent distress. Eyes: PERRLA, EOMs, conjunctiva no swelling or erythema ENT/Mouth: Ear canals clear with no erythema, swelling, or discharge.  TMs normal bilaterally, oropharynx clear, moist, with no exudate.   Neck: Supple, thyroid normal, no JVD, no cervical adenopathy.  Respiratory: Respiratory effort normal, breath sounds clear A&P, no wheeze, rhonchi or rales noted.  No retractions, no accessory muscle usage Cardio: RRR with no MRGs. No noted edema.  Abdomen: Soft, + BS.  Non tender, no guarding, rebound, hernias, masses. Musculoskeletal: Full ROM, 5/5 strength, Normal gait Skin: Warm, dry without rashes, lesions, ecchymosis.  Neuro: Awake and oriented X 3, Cranial nerves intact. No cerebellar symptoms.  Psych: normal affect, Insight and Judgment appropriate.    Vicie Mutters, PA-C 2:44 PM [facility]

## 2018-08-05 LAB — COMPLETE METABOLIC PANEL WITH GFR
AG Ratio: 2 (calc) (ref 1.0–2.5)
ALT: 11 U/L (ref 6–29)
AST: 18 U/L (ref 10–35)
Albumin: 4.5 g/dL (ref 3.6–5.1)
Alkaline phosphatase (APISO): 46 U/L (ref 33–130)
BILIRUBIN TOTAL: 0.3 mg/dL (ref 0.2–1.2)
BUN / CREAT RATIO: 21 (calc) (ref 6–22)
BUN: 23 mg/dL (ref 7–25)
CO2: 27 mmol/L (ref 20–32)
Calcium: 9.7 mg/dL (ref 8.6–10.4)
Chloride: 102 mmol/L (ref 98–110)
Creat: 1.07 mg/dL — ABNORMAL HIGH (ref 0.50–0.99)
GFR, Est African American: 61 mL/min/{1.73_m2} (ref >=60)
GFR, Est Non African American: 53 mL/min/{1.73_m2} — ABNORMAL LOW (ref >=60)
Globulin: 2.3 g/dL (calc) (ref 1.9–3.7)
Glucose, Bld: 89 mg/dL (ref 65–99)
Potassium: 4.3 mmol/L (ref 3.5–5.3)
Sodium: 139 mmol/L (ref 135–146)
Total Protein: 6.8 g/dL (ref 6.1–8.1)

## 2018-08-05 LAB — CBC WITH DIFFERENTIAL/PLATELET
Absolute Monocytes: 860 cells/uL (ref 200–950)
BASOS ABS: 60 {cells}/uL (ref 0–200)
Basophils Relative: 0.7 %
EOS ABS: 146 {cells}/uL (ref 15–500)
Eosinophils Relative: 1.7 %
HCT: 34 % — ABNORMAL LOW (ref 35.0–45.0)
HEMOGLOBIN: 11.3 g/dL — AB (ref 11.7–15.5)
Lymphs Abs: 2477 cells/uL (ref 850–3900)
MCH: 30.9 pg (ref 27.0–33.0)
MCHC: 33.2 g/dL (ref 32.0–36.0)
MCV: 92.9 fL (ref 80.0–100.0)
MONOS PCT: 10 %
MPV: 9.3 fL (ref 7.5–12.5)
Neutro Abs: 5057 cells/uL (ref 1500–7800)
Neutrophils Relative %: 58.8 %
Platelets: 286 10*3/uL (ref 140–400)
RBC: 3.66 10*6/uL — ABNORMAL LOW (ref 3.80–5.10)
RDW: 12.5 % (ref 11.0–15.0)
Total Lymphocyte: 28.8 %
WBC: 8.6 10*3/uL (ref 3.8–10.8)

## 2018-08-05 LAB — LIPID PANEL
CHOL/HDL RATIO: 2.1 (calc) (ref <5.0)
Cholesterol: 222 mg/dL — ABNORMAL HIGH (ref <200)
HDL: 106 mg/dL (ref >50)
LDL Cholesterol (Calc): 81 mg/dL (calc)
Non-HDL Cholesterol (Calc): 116 mg/dL (calc) (ref <130)
Triglycerides: 269 mg/dL — ABNORMAL HIGH (ref <150)

## 2018-08-05 LAB — TSH: TSH: 2.18 m[IU]/L (ref 0.40–4.50)

## 2018-08-05 LAB — VARICELLA ZOSTER ANTIBODY, IGG: Varicella IgG: 222.4 index

## 2018-08-16 DIAGNOSIS — Z23 Encounter for immunization: Secondary | ICD-10-CM | POA: Diagnosis not present

## 2018-08-22 ENCOUNTER — Other Ambulatory Visit: Payer: Self-pay

## 2018-08-22 DIAGNOSIS — N3281 Overactive bladder: Secondary | ICD-10-CM

## 2018-08-22 MED ORDER — OXYBUTYNIN CHLORIDE 5 MG PO TABS: ORAL_TABLET | ORAL | 3 refills | Status: AC

## 2018-08-22 NOTE — Telephone Encounter (Signed)
Oxybutynin refill request

## 2018-09-01 DIAGNOSIS — E782 Mixed hyperlipidemia: Secondary | ICD-10-CM

## 2018-09-01 DIAGNOSIS — E538 Deficiency of other specified B group vitamins: Secondary | ICD-10-CM

## 2018-09-01 DIAGNOSIS — D649 Anemia, unspecified: Secondary | ICD-10-CM

## 2018-09-08 ENCOUNTER — Ambulatory Visit (INDEPENDENT_AMBULATORY_CARE_PROVIDER_SITE_OTHER): Payer: Medicare Other

## 2018-09-08 DIAGNOSIS — D649 Anemia, unspecified: Secondary | ICD-10-CM

## 2018-09-08 DIAGNOSIS — E782 Mixed hyperlipidemia: Secondary | ICD-10-CM

## 2018-09-08 DIAGNOSIS — E538 Deficiency of other specified B group vitamins: Secondary | ICD-10-CM

## 2018-09-08 NOTE — Progress Notes (Signed)
Patient here for a nurse visit to have labs done. Patient states that she has fasted for these. No changes with medication. No questions or concerns.  Vitals taken and recorded.

## 2018-09-09 LAB — COMPLETE METABOLIC PANEL WITH GFR
AG RATIO: 2 (calc) (ref 1.0–2.5)
ALBUMIN MSPROF: 4.1 g/dL (ref 3.6–5.1)
ALT: 12 U/L (ref 6–29)
AST: 19 U/L (ref 10–35)
Alkaline phosphatase (APISO): 31 U/L — ABNORMAL LOW (ref 37–153)
BUN: 7 mg/dL (ref 7–25)
CO2: 28 mmol/L (ref 20–32)
Calcium: 9.7 mg/dL (ref 8.6–10.4)
Chloride: 98 mmol/L (ref 98–110)
Creat: 0.79 mg/dL (ref 0.50–0.99)
GFR, Est African American: 89 mL/min/{1.73_m2} (ref >=60)
GFR, Est Non African American: 76 mL/min/{1.73_m2} (ref >=60)
Globulin: 2.1 g/dL (calc) (ref 1.9–3.7)
Glucose, Bld: 85 mg/dL (ref 65–99)
Potassium: 4.7 mmol/L (ref 3.5–5.3)
SODIUM: 136 mmol/L (ref 135–146)
Total Bilirubin: 0.6 mg/dL (ref 0.2–1.2)
Total Protein: 6.2 g/dL (ref 6.1–8.1)

## 2018-09-09 LAB — IRON, TOTAL/TOTAL IRON BINDING CAP
%SAT: 46 % — AB (ref 16–45)
Iron: 112 ug/dL (ref 45–160)
TIBC: 246 mcg/dL (calc) — ABNORMAL LOW (ref 250–450)

## 2018-09-09 LAB — CBC WITH DIFFERENTIAL/PLATELET
ABSOLUTE MONOCYTES: 848 {cells}/uL (ref 200–950)
Basophils Absolute: 67 cells/uL (ref 0–200)
Basophils Relative: 1.1 %
EOS ABS: 177 {cells}/uL (ref 15–500)
Eosinophils Relative: 2.9 %
HCT: 32.4 % — ABNORMAL LOW (ref 35.0–45.0)
Hemoglobin: 10.9 g/dL — ABNORMAL LOW (ref 11.7–15.5)
Lymphs Abs: 2037 cells/uL (ref 850–3900)
MCH: 31.1 pg (ref 27.0–33.0)
MCHC: 33.6 g/dL (ref 32.0–36.0)
MCV: 92.3 fL (ref 80.0–100.0)
MPV: 9.5 fL (ref 7.5–12.5)
Monocytes Relative: 13.9 %
Neutro Abs: 2971 cells/uL (ref 1500–7800)
Neutrophils Relative %: 48.7 %
Platelets: 269 10*3/uL (ref 140–400)
RBC: 3.51 10*6/uL — ABNORMAL LOW (ref 3.80–5.10)
RDW: 12.8 % (ref 11.0–15.0)
Total Lymphocyte: 33.4 %
WBC: 6.1 10*3/uL (ref 3.8–10.8)

## 2018-09-09 LAB — LIPID PANEL
Cholesterol: 189 mg/dL (ref <200)
HDL: 74 mg/dL (ref >=50)
LDL Cholesterol (Calc): 94 mg/dL (calc)
Non-HDL Cholesterol (Calc): 115 mg/dL (calc) (ref <130)
Total CHOL/HDL Ratio: 2.6 (calc) (ref <5.0)
Triglycerides: 116 mg/dL (ref <150)

## 2018-09-09 LAB — VITAMIN B12: Vitamin B-12: 562 pg/mL (ref 200–1100)

## 2018-09-09 LAB — FERRITIN: FERRITIN: 73 ng/mL (ref 16–288)

## 2018-09-10 ENCOUNTER — Other Ambulatory Visit: Payer: Self-pay | Admitting: Internal Medicine

## 2018-09-10 DIAGNOSIS — F5101 Primary insomnia: Secondary | ICD-10-CM

## 2018-09-10 MED ORDER — TRAZODONE HCL 100 MG PO TABS: ORAL_TABLET | ORAL | 3 refills | Status: DC

## 2018-09-12 ENCOUNTER — Other Ambulatory Visit: Payer: Self-pay | Admitting: *Deleted

## 2018-09-12 DIAGNOSIS — F5101 Primary insomnia: Secondary | ICD-10-CM

## 2018-09-12 MED ORDER — TRAZODONE HCL 100 MG PO TABS: ORAL_TABLET | ORAL | 3 refills | Status: AC

## 2018-09-14 ENCOUNTER — Other Ambulatory Visit: Payer: Self-pay | Admitting: Internal Medicine

## 2018-09-14 DIAGNOSIS — K219 Gastro-esophageal reflux disease without esophagitis: Secondary | ICD-10-CM

## 2018-09-14 MED ORDER — ESOMEPRAZOLE MAGNESIUM 40 MG PO CPDR: 40.0000 mg | DELAYED_RELEASE_CAPSULE | Freq: Every day | ORAL | 1 refills | Status: DC

## 2018-09-14 MED ORDER — ESOMEPRAZOLE MAGNESIUM 40 MG PO CPDR: 40.0000 mg | DELAYED_RELEASE_CAPSULE | Freq: Every day | ORAL | 0 refills | Status: DC

## 2018-09-14 MED ORDER — ESOMEPRAZOLE MAGNESIUM 40 MG PO CPDR: DELAYED_RELEASE_CAPSULE | ORAL | 3 refills | Status: DC

## 2018-09-29 ENCOUNTER — Other Ambulatory Visit: Payer: Self-pay | Admitting: Internal Medicine

## 2018-09-29 ENCOUNTER — Other Ambulatory Visit: Payer: Self-pay | Admitting: *Deleted

## 2018-09-29 MED ORDER — BUPROPION HCL ER (XL) 150 MG PO TB24: ORAL_TABLET | ORAL | 3 refills | Status: AC

## 2018-11-09 ENCOUNTER — Other Ambulatory Visit: Payer: Self-pay

## 2018-11-09 MED ORDER — OLMESARTAN MEDOXOMIL 40 MG PO TABS: 40.0000 mg | ORAL_TABLET | Freq: Every day | ORAL | 1 refills | Status: DC

## 2018-11-14 ENCOUNTER — Encounter: Payer: Self-pay | Admitting: Internal Medicine

## 2018-11-23 ENCOUNTER — Other Ambulatory Visit: Payer: Self-pay | Admitting: Internal Medicine

## 2018-11-23 MED ORDER — MONTELUKAST SODIUM 10 MG PO TABS: ORAL_TABLET | ORAL | 3 refills | Status: DC

## 2018-11-25 ENCOUNTER — Other Ambulatory Visit: Payer: Self-pay | Admitting: Internal Medicine

## 2018-11-25 DIAGNOSIS — Z9109 Other allergy status, other than to drugs and biological substances: Secondary | ICD-10-CM

## 2018-11-25 MED ORDER — MONTELUKAST SODIUM 10 MG PO TABS: ORAL_TABLET | ORAL | 3 refills | Status: DC

## 2018-12-06 ENCOUNTER — Other Ambulatory Visit: Payer: Self-pay | Admitting: Internal Medicine

## 2018-12-06 DIAGNOSIS — Z9109 Other allergy status, other than to drugs and biological substances: Secondary | ICD-10-CM

## 2018-12-19 ENCOUNTER — Other Ambulatory Visit: Payer: Self-pay | Admitting: Internal Medicine

## 2018-12-19 DIAGNOSIS — K5190 Ulcerative colitis, unspecified, without complications: Secondary | ICD-10-CM

## 2018-12-19 MED ORDER — MESALAMINE 800 MG PO TBEC: DELAYED_RELEASE_TABLET | ORAL | 3 refills | Status: AC

## 2018-12-21 ENCOUNTER — Other Ambulatory Visit: Payer: Self-pay | Admitting: Internal Medicine

## 2018-12-21 DIAGNOSIS — K219 Gastro-esophageal reflux disease without esophagitis: Secondary | ICD-10-CM

## 2019-01-17 ENCOUNTER — Other Ambulatory Visit: Payer: Self-pay | Admitting: Internal Medicine

## 2019-02-16 ENCOUNTER — Other Ambulatory Visit: Payer: Self-pay | Admitting: Internal Medicine

## 2019-02-20 ENCOUNTER — Encounter: Payer: Self-pay | Admitting: Internal Medicine

## 2019-02-20 NOTE — Patient Instructions (Signed)
- Vit D  And Vit C 1,000 mg  are recommended to help protect  against the Covid_19 and other Corona viruses.   - Also it's recommended to take Zinc 50 mg to help  protect against the Covid_19  And best place to get  is also on [employer].com and don't pay more than 6-8 cents /pill !   ================================ Coronavirus (COVID-19) Are you at risk?  Are you at risk for the Coronavirus (COVID-19)?  To be considered HIGH RISK for Coronavirus (COVID-19), you have to meet the following criteria:  . Traveled to [place], [place], [place], [place] or [place]; or in the [place] to [place], [place], [place]  . or [place]; and have fever, cough, and shortness of breath within the last 2 weeks of travel OR . Been in close contact with a person diagnosed with COVID-19 within the last 2 weeks and have  . fever, cough,and shortness of breath .  . IF YOU DO NOT MEET THESE CRITERIA, YOU ARE CONSIDERED LOW RISK FOR COVID-19.  What to do if you are HIGH RISK for COVID-19?  [name] If you are having a medical emergency, call 911. . Seek medical care right away. Before you go to a doctor's office, urgent care or emergency department, .  call ahead and tell them about your recent travel, contact with someone diagnosed with COVID-19  .  and your symptoms.  . You should receive instructions from your physician's office regarding next steps of care.  . When you arrive at healthcare provider, tell the healthcare staff immediately you have returned from  . visiting [place], [place], [place], [place] or [place]; or traveled in the [place] to [place], [place],  . [facility] or [place] in the last two weeks or you have been in close contact with a person diagnosed with  . COVID-19 in the last 2 weeks.   . Tell the health care staff about your symptoms: fever, cough and shortness of breath. . After you have been seen by a medical provider, you will be either: o Tested for (COVID-19) and  discharged home on quarantine except to seek medical care if  o symptoms worsen, and asked to  - Stay home and avoid contact with others until you get your results (4-5 days)  - Avoid travel on public transportation if possible (such as bus, train, or airplane) or o Sent to the Emergency Department by EMS for evaluation, COVID-19 testing  and  o possible admission depending on your condition and test results.  What to do if you are LOW RISK for COVID-19?  Reduce your risk of any infection by using the same precautions used for avoiding the common cold or flu:  [name] Wash your hands often with soap and warm water for at least 20 seconds.  If soap and water are not readily available,  . use an alcohol-based hand sanitizer with at least 60% alcohol.  . If coughing or sneezing, cover your mouth and nose by coughing or sneezing into the elbow areas of your shirt or coat, .  into a tissue or into your sleeve (not your hands). . Avoid shaking hands with others and consider head nods or verbal greetings only. . Avoid touching your eyes, nose, or mouth with unwashed hands.  . Avoid close contact with people who are sick. . Avoid places or events with large numbers of people in one location, like concerts or sporting events. . Carefully consider travel plans  you have or are making. . If you are planning any travel outside or inside the [place], visit the CDC's Travelers' Health webpage for the latest health notices. . If you have some symptoms but not all symptoms, continue to monitor at home and seek medical attention  . if your symptoms worsen. . If you are having a medical emergency, call 911.   . >>>>>>>>>>>>>>>>>>>>>>>>>>>>>>>>> . We Do NOT Approve of  [facility] [place] Soliciting Our Patients  To Do Home Visits & We Do NOT Approve of [employer] > > > > > > > > > > > > > > > > > > > > > > > > > > > > > > > > > > > > > > >  Preventive Care for Adults  A healthy lifestyle  and preventive care can promote health and wellness. Preventive health guidelines for women include the following key practices.  A routine yearly physical is a good way to check with your health care provider about your health and preventive screening. It is a chance to share any concerns and updates on your health and to receive a thorough exam.  Visit your dentist for a routine exam and preventive care every 6 months. Brush your teeth twice a day and floss once a day. Good oral hygiene prevents tooth decay and gum disease.  The frequency of eye exams is based on your age, health, family medical history, use of contact lenses, and other factors. Follow your health care provider's recommendations for frequency of eye exams.  Eat a healthy diet. Foods like vegetables, fruits, whole grains, low-fat dairy products, and lean protein foods contain the nutrients you need without too many calories. Decrease your intake of foods high in solid fats, added sugars, and salt. Eat the right amount of calories for you. Get information about a proper diet from your health care provider, if necessary.  Regular physical exercise is one of the most important things you can do for your health. Most adults should get at least 150 minutes of moderate-intensity exercise (any activity that increases your heart rate and causes you to sweat) each week. In addition, most adults need muscle-strengthening exercises on 2 or more days a week.  Maintain a healthy weight. The body mass index (BMI) is a screening tool to identify possible weight problems. It provides an estimate of body fat based on height and weight. Your health care provider can find your BMI and can help you achieve or maintain a healthy weight. For adults 20 years and older:  A BMI below 18.5 is considered underweight.  A BMI of 18.5 to 24.9 is normal.  A BMI of 25 to 29.9 is considered overweight.  A BMI of 30 and above is considered obese.  Maintain  normal blood lipids and cholesterol levels by exercising and minimizing your intake of saturated fat. Eat a balanced diet with plenty of fruit and vegetables. If your lipid or cholesterol levels are high, you are over 50, or you are at high risk for heart disease, you may need your cholesterol levels checked more frequently. Ongoing high lipid and cholesterol levels should be treated with medicines if diet and exercise are not working.  If you smoke, find out from your health care provider how to quit. If you do not use tobacco, do not start.  Lung cancer screening is recommended for adults aged 55-80 years who are at high risk for developing lung cancer because of a history   of smoking. A yearly low-dose CT scan of the lungs is recommended for people who have at least a 30-pack-year history of smoking and are a current smoker or have quit within the past 15 years. A pack year of smoking is smoking an average of 1 pack of cigarettes a day for 1 year (for example: 1 pack a day for 30 years or 2 packs a day for 15 years). Yearly screening should continue until the smoker has stopped smoking for at least 15 years. Yearly screening should be stopped for people who develop a health problem that would prevent them from having lung cancer treatment.  Avoid use of street drugs. Do not share needles with anyone. Ask for help if you need support or instructions about stopping the use of drugs.  High blood pressure causes heart disease and increases the risk of stroke.  Ongoing high blood pressure should be treated with medicines if weight loss and exercise do not work.  If you are 27-58 years old, ask your health care provider if you should take aspirin to prevent strokes.  Diabetes screening involves taking a blood sample to check your fasting blood sugar level. This should be done once every 3 years, after age 73, if you are within normal weight and without risk factors for diabetes. Testing should be considered  at a younger age or be carried out more frequently if you are overweight and have at least 1 risk factor for diabetes.  Breast cancer screening is essential preventive care for women. You should practice "breast self-awareness." This means understanding the normal appearance and feel of your breasts and may include breast self-examination. Any changes detected, no matter how small, should be reported to a health care provider. Women in their 43s and 30s should have a clinical breast exam (CBE) by a health care provider as part of a regular health exam every 1 to 3 years. After age 4, women should have a CBE every year. Starting at age 77, women should consider having a mammogram (breast X-ray test) every year. Women who have a family history of breast cancer should talk to their health care provider about genetic screening. Women at a high risk of breast cancer should talk to their health care providers about having an MRI and a mammogram every year.  Breast cancer gene (BRCA)-related cancer risk assessment is recommended for women who have family members with BRCA-related cancers. BRCA-related cancers include breast, ovarian, tubal, and peritoneal cancers. Having family members with these cancers may be associated with an increased risk for harmful changes (mutations) in the breast cancer genes BRCA1 and BRCA2. Results of the assessment will determine the need for genetic counseling and BRCA1 and BRCA2 testing.  Routine pelvic exams to screen for cancer are no longer recommended for nonpregnant women who are considered low risk for cancer of the pelvic organs (ovaries, uterus, and vagina) and who do not have symptoms. Ask your health care provider if a screening pelvic exam is right for you.  If you have had past treatment for cervical cancer or a condition that could lead to cancer, you need Pap tests and screening for cancer for at least 20 years after your treatment. If Pap tests have been discontinued,  your risk factors (such as having a new sexual partner) need to be reassessed to determine if screening should be resumed. Some women have medical problems that increase the chance of getting cervical cancer. In these cases, your health care provider may recommend more  frequent screening and Pap tests.    Colorectal cancer can be detected and often prevented. Most routine colorectal cancer screening begins at the age of 50 years and continues through age 75 years. However, your health care provider may recommend screening at an earlier age if you have risk factors for colon cancer. On a yearly basis, your health care provider may provide home test kits to check for hidden blood in the stool. Use of a small camera at the end of a tube, to directly examine the colon (sigmoidoscopy or colonoscopy), can detect the earliest forms of colorectal cancer. Talk to your health care provider about this at age 50, when routine screening begins.  Direct exam of the colon should be repeated every 5-10 years through age 75 years, unless early forms of pre-cancerous polyps or small growths are found.  Osteoporosis is a disease in which the bones lose minerals and strength with aging. This can result in serious bone fractures or breaks. The risk of osteoporosis can be identified using a bone density scan. Women ages 65 years and over and women at risk for fractures or osteoporosis should discuss screening with their health care providers. Ask your health care provider whether you should take a calcium supplement or vitamin D to reduce the rate of osteoporosis.  Menopause can be associated with physical symptoms and risks. Hormone replacement therapy is available to decrease symptoms and risks. You should talk to your health care provider about whether hormone replacement therapy is right for you.  Use sunscreen. Apply sunscreen liberally and repeatedly throughout the day. You should seek shade when your shadow is shorter  than you. Protect yourself by wearing long sleeves, pants, a wide-brimmed hat, and sunglasses year round, whenever you are outdoors.  Once a month, do a whole body skin exam, using a mirror to look at the skin on your back. Tell your health care provider of new moles, moles that have irregular borders, moles that are larger than a pencil eraser, or moles that have changed in shape or color.  Stay current with required vaccines (immunizations).  Influenza vaccine. All adults should be immunized every year.  Tetanus, diphtheria, and acellular pertussis (Td, Tdap) vaccine. Pregnant women should receive 1 dose of Tdap vaccine during each pregnancy. The dose should be obtained regardless of the length of time since the last dose. Immunization is preferred during the 27th-36th week of gestation. An adult who has not previously received Tdap or who does not know her vaccine status should receive 1 dose of Tdap. This initial dose should be followed by tetanus and diphtheria toxoids (Td) booster doses every 10 years. Adults with an unknown or incomplete history of completing a 3-dose immunization series with Td-containing vaccines should begin or complete a primary immunization series including a Tdap dose. Adults should receive a Td booster every 10 years.    Zoster vaccine. One dose is recommended for adults aged 60 years or older unless certain conditions are present.    Pneumococcal 13-valent conjugate (PCV13) vaccine. When indicated, a person who is uncertain of her immunization history and has no record of immunization should receive the PCV13 vaccine. An adult aged 19 years or older who has certain medical conditions and has not been previously immunized should receive 1 dose of PCV13 vaccine. This PCV13 should be followed with a dose of pneumococcal polysaccharide (PPSV23) vaccine. The PPSV23 vaccine dose should be obtained at least 1 or more year(s) after the dose of PCV13 vaccine. An adult   aged 24  years or older who has certain medical conditions and previously received 1 or more doses of PPSV23 vaccine should receive 1 dose of PCV13. The PCV13 vaccine dose should be obtained 1 or more years after the last PPSV23 vaccine dose.    Pneumococcal polysaccharide (PPSV23) vaccine. When PCV13 is also indicated, PCV13 should be obtained first. All adults aged 74 years and older should be immunized. An adult younger than age 85 years who has certain medical conditions should be immunized. Any person who resides in a nursing home or long-term care facility should be immunized. An adult smoker should be immunized. People with an immunocompromised condition and certain other conditions should receive both PCV13 and PPSV23 vaccines. People with human immunodeficiency virus (HIV) infection should be immunized as soon as possible after diagnosis. Immunization during chemotherapy or radiation therapy should be avoided. Routine use of PPSV23 vaccine is not recommended for American Indians, [place] Natives, or people younger than 65 years unless there are medical conditions that require PPSV23 vaccine. When indicated, people who have unknown immunization and have no record of immunization should receive PPSV23 vaccine. One-time revaccination 5 years after the first dose of PPSV23 is recommended for people aged 19-64 years who have chronic kidney failure, nephrotic syndrome, asplenia, or immunocompromised conditions. People who received 1-2 doses of PPSV23 before age 10 years should receive another dose of PPSV23 vaccine at age 31 years or later if at least 5 years have passed since the previous dose. Doses of PPSV23 are not needed for people immunized with PPSV23 at or after age 76 years.   Preventive Services / Frequency  Ages 30 years and over  Blood pressure check.  Lipid and cholesterol check.  Lung cancer screening. / Every year if you are aged 72-80 years and have a 30-pack-year history of smoking and  currently smoke or have quit within the past 15 years. Yearly screening is stopped once you have quit smoking for at least 15 years or develop a health problem that would prevent you from having lung cancer treatment.  Clinical breast exam.** / Every year after age 25 years.   BRCA-related cancer risk assessment.** / For women who have family members with a BRCA-related cancer (breast, ovarian, tubal, or peritoneal cancers).  Mammogram.** / Every year beginning at age 29 years and continuing for as long as you are in good health. Consult with your health care provider.  Pap test.** / Every 3 years starting at age 79 years through age 74 or 44 years with 3 consecutive normal Pap tests. Testing can be stopped between 65 and 70 years with 3 consecutive normal Pap tests and no abnormal Pap or HPV tests in the past 10 years.  Fecal occult blood test (FOBT) of stool. / Every year beginning at age 43 years and continuing until age 42 years. You may not need to do this test if you get a colonoscopy every 10 years.  Flexible sigmoidoscopy or colonoscopy.** / Every 5 years for a flexible sigmoidoscopy or every 10 years for a colonoscopy beginning at age 34 years and continuing until age 35 years.  Hepatitis C blood test.** / For all people born from [birth year] through 1967 and any individual with known risks for hepatitis C.  Osteoporosis screening.** / A one-time screening for women ages 58 years and over and women at risk for fractures or osteoporosis.  Skin self-exam. / Monthly.  Influenza vaccine. / Every year.  Tetanus, diphtheria, and acellular pertussis (Tdap/Td) vaccine.** /  1 dose of Td every 10 years.  Zoster vaccine.** / 1 dose for adults aged 67 years or older.  Pneumococcal 13-valent conjugate (PCV13) vaccine.** / Consult your health care provider.  Pneumococcal polysaccharide (PPSV23) vaccine.** / 1 dose for all adults aged 37 years and older. Screening for abdominal aortic aneurysm (AAA)   by ultrasound is recommended for people who have history of high blood pressure or who are current or former smokers. ++++++++++++++++++++ Recommend Adult Low Dose Aspirin or  coated  Aspirin 81 mg daily  To reduce risk of Colon Cancer 40 %,  Skin Cancer 26 % ,  Melanoma 46%  and  Pancreatic cancer 60% ++++++++++++++++++++ Vitamin D goal  is between 70-100.  Please make sure that you are taking your Vitamin D as directed.  It is very important as a natural anti-inflammatory  helping hair, skin, and nails, as well as reducing stroke and heart attack risk.  It helps your bones and helps with mood. It also decreases numerous cancer risks so please take it as directed.  Low Vit D is associated with a 200-300% higher risk for CANCER  and 200-300% higher risk for HEART   ATTACK  &  STROKE.   .....................................[name] It is also associated with higher death rate at younger ages,  autoimmune diseases like Rheumatoid arthritis, Lupus, Multiple Sclerosis.    Also many other serious conditions, like depression, Alzheimer's Dementia, infertility, muscle aches, fatigue, fibromyalgia - just to name a few. ++++++++++++++++++ Recommend the book "The END of DIETING" by Dr Excell Seltzer  & the book "The END of DIABETES " by Dr Excell Seltzer At [email or web address] - get book & Audio CD's    Being diabetic has a  300% increased risk for heart attack, stroke, cancer, and alzheimer- type vascular dementia. It is very important that you work harder with diet by avoiding all foods that are white. Avoid white rice (brown & wild rice is OK), white potatoes (sweetpotatoes in moderation is OK), White bread or wheat bread or anything made out of white flour like bagels, donuts, rolls, buns, biscuits, cakes, pastries, cookies, pizza crust, and pasta (made from white flour & egg whites) - vegetarian pasta or spinach or wheat pasta is OK. Multigrain breads like Arnold's or [employer], or multigrain sandwich thins  or flatbreads.  Diet, exercise and weight loss can reverse and cure diabetes in the early stages.  Diet, exercise and weight loss is very important in the control and prevention of complications of diabetes which affects every system in your body, ie. Brain - dementia/stroke, eyes - glaucoma/blindness, heart - heart attack/heart failure, kidneys - dialysis, stomach - gastric paralysis, intestines - malabsorption, nerves - severe painful neuritis, circulation - gangrene & loss of a leg(s), and finally cancer and Alzheimers.    I recommend avoid fried & greasy foods,  sweets/candy, white rice (brown or wild rice or Quinoa is OK), white potatoes (sweet potatoes are OK) - anything made from white flour - bagels, doughnuts, rolls, buns, biscuits,white and wheat breads, pizza crust and traditional pasta made of white flour & egg white(vegetarian pasta or spinach or wheat pasta is OK).  Multi-grain bread is OK - like multi-grain flat bread or sandwich thins. Avoid alcohol in excess. Exercise is also important.    Eat all the vegetables you want - avoid meat, especially red meat and dairy - especially cheese.  Cheese is the most concentrated form of trans-fats which is the worst thing to clog up our arteries. Veggie  cheese is OK which can be found in the fresh produce section at [employer] or [employer] or [employer]  +++++++++++++++++++ DASH Eating Plan  DASH stands for "Dietary Approaches to Stop Hypertension."   The DASH eating plan is a healthy eating plan that has been shown to reduce high blood pressure (hypertension). Additional health benefits may include reducing the risk of type 2 diabetes mellitus, heart disease, and stroke. The DASH eating plan may also help with weight loss. WHAT DO I NEED TO KNOW ABOUT THE DASH EATING PLAN? For the DASH eating plan, you will follow these general guidelines:  Choose foods with a percent daily value for sodium of less than 5% (as listed on the food  label).  Use salt-free seasonings or herbs instead of table salt or sea salt.  Check with your health care provider or pharmacist before using salt substitutes.  Eat lower-sodium products, often labeled as "lower sodium" or "no salt added."  Eat fresh foods.  Eat more vegetables, fruits, and low-fat dairy products.  Choose whole grains. Look for the word "whole" as the first word in the ingredient list.  Choose fish   Limit sweets, desserts, sugars, and sugary drinks.  Choose heart-healthy fats.  Eat veggie cheese   Eat more home-cooked food and less restaurant, buffet, and fast food.  Limit fried foods.  Cook foods using methods other than frying.  Limit canned vegetables. If you do use them, rinse them well to decrease the sodium.  When eating at a restaurant, ask that your food be prepared with less salt, or no salt if possible.                      WHAT FOODS CAN I EAT? Read Dr Fara Olden Fuhrman's books on The End of Dieting & The End of Diabetes  Grains Whole grain or whole wheat bread. Brown rice. Whole grain or whole wheat pasta. Quinoa, bulgur, and whole grain cereals. Low-sodium cereals. Corn or whole wheat flour tortillas. Whole grain cornbread. Whole grain crackers. Low-sodium crackers.  Vegetables Fresh or frozen vegetables (raw, steamed, roasted, or grilled). Low-sodium or reduced-sodium tomato and vegetable juices. Low-sodium or reduced-sodium tomato sauce and paste. Low-sodium or reduced-sodium canned vegetables.   Fruits All fresh, canned (in natural juice), or frozen fruits.  Protein Products  All fish and seafood.  Dried beans, peas, or lentils. Unsalted nuts and seeds. Unsalted canned beans.  Dairy Low-fat dairy products, such as skim or 1% milk, 2% or reduced-fat cheeses, low-fat ricotta or cottage cheese, or plain low-fat yogurt. Low-sodium or reduced-sodium cheeses.  Fats and Oils Tub margarines without trans fats. Light or reduced-fat mayonnaise  and salad dressings (reduced sodium). Avocado. Safflower, olive, or canola oils. Natural peanut or almond butter.  Other Unsalted popcorn and pretzels. The items listed above may not be a complete list of recommended foods or beverages. Contact your dietitian for more options.  +++++++++++++++  WHAT FOODS ARE NOT RECOMMENDED? Grains/ White flour or wheat flour White bread. White pasta. White rice. Refined cornbread. Bagels and croissants. Crackers that contain trans fat.  Vegetables  Creamed or fried vegetables. Vegetables in a . Regular canned vegetables. Regular canned tomato sauce and paste. Regular tomato and vegetable juices.  Fruits Dried fruits. Canned fruit in light or heavy syrup. Fruit juice.  Meat and Other Protein Products Meat in general - RED meat & White meat.  Fatty cuts of meat. Ribs, chicken wings, all processed meats as bacon, sausage, bologna, salami,  fatback, hot dogs, bratwurst and packaged luncheon meats.  Dairy Whole or 2% milk, cream, half-and-half, and cream cheese. Whole-fat or sweetened yogurt. Full-fat cheeses or blue cheese. Non-dairy creamers and whipped toppings. Processed cheese, cheese spreads, or cheese curds.  Condiments Onion and garlic salt, seasoned salt, table salt, and sea salt. Canned and packaged gravies. Worcestershire sauce. Tartar sauce. Barbecue sauce. Teriyaki sauce. Soy sauce, including reduced sodium. Steak sauce. Fish sauce. Oyster sauce. Cocktail sauce. Horseradish. Ketchup and mustard. Meat flavorings and tenderizers. Bouillon cubes. Hot sauce. Tabasco sauce. Marinades. Taco seasonings. Relishes.  Fats and Oils Butter, stick margarine, lard, shortening and bacon fat. Coconut, palm kernel, or palm oils. Regular salad dressings.  Pickles and olives. Salted popcorn and pretzels.  The items listed above may not be a complete list of foods and beverages to avoid.

## 2019-02-20 NOTE — Progress Notes (Signed)
Comprehensive Evaluation &  Examination     This very nice 68 y.o. DWF presents for a Screening /Preventative Visit & comprehensive evaluation and management of multiple medical co-morbidities.  Patient has been followed for HTN, HLD, Prediabetes  and Vitamin D Deficiency. Patient had been  followed by Dr Collene Mares for UC, altho today states she is not & will not follow up with Dr Collene Mares again as she "yelled at her" at last colonoscopy because  she didn't complete her prep. Patient  also has GERD quiescient on her meds. 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      Patient relates that she has not been out of her house or around "people" since last year, long before the Covid pandemic began in March/April. She has all of her supplies [employer] /etc delivered to her house. She relates that she avoids being around people and has avoided contact with any of her previous friends even to the point of not talking to anyone on the telephone.       HTN predates circa 1997 and  has been controlled at home and patient denies any cardiac symptoms as chest pain, palpitations, shortness of breath, dizziness or ankle swelling. Today's BP  is at goal - 123/82.      Patient's hyperlipidemia is controlled with diet and RYRE. Last lipids were at goal: Lab Results  Component Value Date   CHOL 189 09/08/2018   HDL 74 09/08/2018   LDLCALC 94 09/08/2018   TRIG 116 09/08/2018   CHOLHDL 2.6 09/08/2018      Patient has hx/o prediabetes predating  (A1c 5.7% / 2013) and patient denies reactive hypoglycemic symptoms, visual blurring, diabetic polys or paresthesias. Last A1c was Normal & at goal: Lab Results  Component Value Date   HGBA1C 5.3 04/22/2018      Finally, patient has history of Vitamin D Deficiency ("28" / 2008)  and last Vitamin D was at goal: Lab Results  Component Value Date   VD25OH 63 04/22/2018   Current Outpatient Medications on File Prior to Visit  Medication Sig  . Ascorbic Acid  (VITAMIN C PO) Take 625 mg by mouth daily.  . ASPIRIN 81 PO Take by mouth.  . Biotin 5000 MCG TABS Take 1 tablet by mouth daily.  [name] buPROPion (WELLBUTRIN XL) 150 MG 24 hr tablet Take 1 tablet every morning for Mood  . busPIRone (BUSPAR) 30 MG tablet TAKE 2 TABLETS ONCE DAILY  . Calcium Carbonate-Vit D-Min (CALCIUM 1200 PO) Take 2,400 mg by mouth daily.  . cholecalciferol (VITAMIN D) 1000 UNITS tablet Take 5,000 Units by mouth daily.   . Coenzyme Q10 (CO Q 10) 100 MG CAPS Take 200 mg by mouth 2 (two) times daily. Quol Ultra CoQ10 163m   . DULoxetine (CYMBALTA) 60 MG capsule Take 1 capsule Daily for Mood & Chronic Pain  . esomeprazole (NEXIUM) 40 MG capsule Take 1 capsule by mouth once daily  . Ferrous Sulfate (IRON PO) Take 65 mg by mouth daily.  .Marland Kitchenibandronate (BONIVA) 150 MG tablet Take 1 tablet (150 mg total) by mouth every 30 (thirty) days. Take in the morning with a full glass of water, on an empty stomach, and do not take anything else by mouth or lie down for the next 30 min.  .Marland KitchenL-Lysine 500 MG TABS Take 1 tablet by mouth daily.  .Marland Kitchenloperamide (IMODIUM) 2 MG capsule Take 2 mg by mouth as needed for diarrhea or loose stools.  .[name]  Magnesium 400 MG TABS Take 1 tablet by mouth daily.  . Mesalamine 800 MG TBEC Take 2 tablets   2 x /day for Ulcerative Colitis  . Methylcellulose, Laxative, (CITRUCEL) 500 MG TABS Take 1,000 mg by mouth 2 (two) times daily.   . montelukast (SINGULAIR) 10 MG tablet TAKE 1 TABLET DAILY FOR    ALLERGIES  . olmesartan (BENICAR) 40 MG tablet Take 1 tablet (40 mg total) by mouth daily.  [name] OVER THE COUNTER MEDICATION Take 300 mg by mouth daily. Mega red 352m  . OVER THE COUNTER MEDICATION Osteo bioflex 2 tablets daily.  .Marland Kitchenoxybutynin (DITROPAN) 5 MG tablet Take 1/2 to 1 tablet  2 to 3  x /day for Bladder  . Red Yeast Rice Extract 600 MG TABS Take 1,200 mg by mouth 2 (two) times daily.  . traZODone (DESYREL) 100 MG tablet Take 1 to 2 tablets 1 hour before Bedtime as needed  for Sleep   No current facility-administered medications on file prior to visit.    Allergies  Allergen Reactions  . Meloxicam Other (See Comments)    Intestinal Bleed  . Augmentin [Amoxicillin-Pot Clavulanate] Diarrhea  . Azulfidine [Sulfasalazine] Nausea And Vomiting    N/V & HA's  . Lyrica [Pregabalin] Swelling    Swelling of feet.  . Other Other (See Comments)    Salt-sensitivity Grain-sensitivity Artificial sweetners-sensitivity Sugar-sensitivity  . Wheat Bran Other (See Comments)    Wheat-sensitivity  . Lactose Intolerance (Gi) Other (See Comments)    unknown  . Quinapril Other (See Comments)    unknown  . Vasotec [Enalaprilat] Cough   Past Medical History:  Diagnosis Date  . Adult celiac disease   . Concussion    fell jumping off of horse in past 6 months- 09/14/12  . DDD (degenerative disc disease)   . Diverticulitis   . DJD (degenerative joint disease)   . [place]   . Fibromyalgia   . Fusion of spine of lumbar region t  . GERD (gastroesophageal reflux disease)   . History of fusion of cervical spine t  . Hyperlipidemia   . Hypertension   . Lactose intolerance   . Proctitis   . Scoliosis   . Spinal stenosis   . Ulcerative colitis ([place]   . Vitamin D deficiency    Health Maintenance  Topic Date Due  . Hepatitis C Screening  012-26-1952 . COLON CANCER SCREENING ANNUAL FOBT  03/11/2017  . INFLUENZA VACCINE  02/25/2019  . MAMMOGRAM  02/23/2020  . TETANUS/TDAP  01/21/2026  . COLONOSCOPY  06/12/2027  . DEXA SCAN  Completed  . PNA vac Low Risk Adult  Completed   Immunization History  Administered Date(s) Administered  . Influenza, High Dose Seasonal PF 08/03/2017, 04/22/2018  . Influenza-Unspecified 04/26/2014, 08/03/2017, 08/16/2018  . Pneumococcal Conjugate-13 08/03/2017  . Pneumococcal Polysaccharide-23 09/26/2012, 08/16/2018  . Td 01/22/2016  . Zoster Recombinat (Shingrix) 08/16/2018   Last Colon -  Last MGM -  Past Surgical  History:  Procedure Laterality Date  . ANTERIOR CERVICAL DECOMP/DISCECTOMY FUSION N/A 02/12/2017   Procedure: C3-4 C4-5 Anterior cervical decompression/discectomy/fusion/exploration and removal of plate at C5 to C7;  Surgeon: SErline Levine MD;  [facility]  Service: Neurosurgery;  Laterality: N/A;  C3-4 C4-5 Anterior cervical decompression/discectomy/fusion/exploration and removal of plate at C5 to C7  . ANTERIOR LATERAL LUMBAR FUSION 4 LEVELS N/A 09/22/2012   Procedure: ANTERIOR LATERAL LUMBAR FUSION 4 LEVELS;  Surgeon: JErline Levine MD;  [facility];  Service: Neurosurgery;  Laterality: N/A;  Thoracic Twelve Lumbar One,Lumbar One-TwoAnterolateral decompression/fusion/percutaneous percutaneous pedicle screws   . BACK SURGERY  2006, 2008   . disectomy  . CHEST TUBE INSERTION Left 09/22/2012   Procedure: CHEST TUBE INSERTION;  Surgeon: Erline Levine, MD;  Location: [facility];  Service: Neurosurgery;  Laterality: Left;  Insertion per Dr. Cyndia Bent  . CHOLECYSTECTOMY N/A 11/22/2013   Procedure: LAPAROSCOPIC CHOLECYSTECTOMY ;  Surgeon: Harl Bowie, MD;  Location: [facility];  Service: General;  Laterality: N/A;  . EYE SURGERY    . FOOT SURGERY Right 1978   Reconstruction after faliure of emergency stitching  . LASIK    . NECK SURGERY  2002   anterior cervical fusion  . TUBAL LIGATION  1983   Family History  Problem Relation Age of Onset  . Prostate cancer Father   . Cancer Father        skin cancer, prostate cancer with non-hodkins lymphoma  . Heart attack Brother 40       CABG  . Hypertension Brother   . Heart disease Brother   . Heart failure Mother   . Stroke Mother   . Hypertension Mother   . Heart attack Mother   . Heart attack Brother 19   Social History   Tobacco Use  . Smoking status: Former Smoker    Packs/day: 1.00    Years: 24.00    Pack years: 24.00    Types: Cigarettes    Quit date: 07/28/1990    Years since quitting: 28.5  . Smokeless tobacco: Never  Used  Substance Use Topics  . Alcohol use: Yes    Alcohol/week: 7.0 standard drinks    Types: 7 Glasses of wine per week  . Drug use: No    ROS Constitutional: Denies fever, chills, weight loss/gain, headaches, insomnia,  night sweats, and change in appetite. Does c/o fatigue. Eyes: Denies redness, blurred vision, diplopia, discharge, itchy, watery eyes.  ENT: Denies discharge, congestion, post nasal drip, epistaxis, sore throat, earache, hearing loss, dental pain, Tinnitus, Vertigo, Sinus pain, snoring.  Cardio: Denies chest pain, palpitations, irregular heartbeat, syncope, dyspnea, diaphoresis, orthopnea, PND, claudication, edema Respiratory: denies cough, dyspnea, DOE, pleurisy, hoarseness, laryngitis, wheezing.  Gastrointestinal: Denies dysphagia, heartburn, reflux, water brash, pain, cramps, nausea, vomiting, bloating, diarrhea, constipation, hematemesis, melena, hematochezia, jaundice, hemorrhoids Genitourinary: Denies dysuria, frequency, urgency, nocturia, hesitancy, discharge, hematuria, flank pain Musculoskeletal: Denies arthralgia, myalgia, stiffness, Jt. Swelling, pain, limp, and strain/sprain. Denies falls. Skin: Denies puritis, rash, hives, warts, acne, eczema, changing in skin lesion Neuro: No weakness, tremor, incoordination, spasms, paresthesia, pain Psychiatric: Denies confusion, memory loss, sensory loss. Denies Depression. Endocrine: Denies change in weight, skin, hair change, nocturia, and paresthesia, diabetic polys, visual blurring, hyper / hypo glycemic episodes.  Heme/Lymph: No excessive bleeding, bruising, enlarged lymph nodes.  Physical Exam  BP 123/82   Pulse 85   Temp (!) 97.2 F (36.2 C)   Resp 16   Ht 5' 4"  (1.626 m)   Wt 130 lb (59 kg)   BMI 22.31 kg/m   General Appearance: Well nourished and in no apparent distress.  Eyes: PERRLA, EOMs, conjunctiva no swelling or erythema, normal fundi and vessels. Sinuses: No frontal/maxillary tenderness  ENT/Mouth: EACs patent / TMs  nl. Nares clear without erythema, swelling, mucoid exudates. Oral hygiene is good. No erythema, swelling, or exudate. Tongue normal, non-obstructing. Tonsils not swollen or erythematous. Hearing normal.  Neck: Supple, thyroid not palpable. No bruits, nodes or JVD. Respiratory: Respiratory effort normal.  BS equal and clear bilateral without rales, rhonci, wheezing or stridor. Cardio: Heart sounds are normal with regular rate and rhythm and no murmurs, rubs or gallops. Peripheral pulses are normal and equal bilaterally without edema. No aortic or femoral bruits. Chest: symmetric with normal excursions and percussion. Breasts: Deferred to Gyn exam scheduled for tomorrow. Abdomen: Flat, soft with bowel sounds active. Nontender, no guarding, rebound, hernias, masses, or organomegaly.  Lymphatics: Non tender without lymphadenopathy.   Musculoskeletal: Full ROM all peripheral extremities, joint stability, 5/5 strength, and normal gait. Skin: Warm and dry without rashes, lesions, cyanosis, clubbing or  ecchymosis.  Neuro: Cranial nerves intact, reflexes equal bilaterally. Normal muscle tone, no cerebellar symptoms. Sensation intact.  Pysch: Alert and oriented X 3, normal affect, Insight and Judgment appropriate.   Assessment and Plan  1. Essential hypertension  - EKG 12-Lead - Urinalysis, Routine w reflex microscopic - Microalbumin / creatinine urine ratio - CBC with Differential/Platelet - COMPLETE METABOLIC PANEL WITH GFR - Magnesium - TSH  2. Hyperlipidemia, mixed  - EKG 12-Lead - Lipid panel - TSH  3. Abnormal glucose  - EKG 12-Lead - Hemoglobin A1c - Insulin, random  4. Vitamin D deficiency  - VITAMIN D 25 Hydroxyl  5. Prediabetes  - EKG 12-Lead - Hemoglobin A1c - Insulin, random  6. B12 deficiency  - Vitamin B12  7. Gastroesophageal reflux disease  - CBC with Differential/Platelet  8. Ulcerative colitis without complications ([place])   9. Factor 5 Leiden mutation, heterozygous ([place])  10. Osteoporosis  11. Screening for colorectal cancer  - POC Hemoccult Bld/Stl  12. Screening for ischemic heart disease  - EKG 12-Lead  13. FHx: heart disease  - EKG 12-Lead  14. Medication management  - Urinalysis, Routine w reflex microscopic - Microalbumin / creatinine urine ratio - Vitamin B12 - CBC with Differential/Platelet - COMPLETE METABOLIC PANEL WITH GFR - Magnesium - Lipid panel - TSH - Hemoglobin A1c - Insulin, random - VITAMIN D 25 Hydroxyl        Patient was counseled in prudent diet to achieve/maintain BMI less than 25 for weight control, BP monitoring, regular exercise and medications. Discussed med's effects and SE's. Screening labs and tests as requested with regular follow-up as recommended. Over 40 minutes of exam, counseling, chart review and high complex critical decision making was performed.  Kirtland Bouchard, MD

## 2019-02-21 ENCOUNTER — Other Ambulatory Visit: Payer: Self-pay

## 2019-02-21 ENCOUNTER — Ambulatory Visit (INDEPENDENT_AMBULATORY_CARE_PROVIDER_SITE_OTHER): Payer: Medicare Other | Admitting: Internal Medicine

## 2019-02-21 ENCOUNTER — Encounter: Payer: Self-pay | Admitting: Internal Medicine

## 2019-02-21 VITALS — BP 123/82 | HR 85 | Temp 97.2°F | Resp 16 | Ht 64.0 in | Wt 130.0 lb

## 2019-02-21 DIAGNOSIS — Z8249 Family history of ischemic heart disease and other diseases of the circulatory system: Secondary | ICD-10-CM

## 2019-02-21 DIAGNOSIS — E559 Vitamin D deficiency, unspecified: Secondary | ICD-10-CM | POA: Diagnosis not present

## 2019-02-21 DIAGNOSIS — E782 Mixed hyperlipidemia: Secondary | ICD-10-CM | POA: Diagnosis not present

## 2019-02-21 DIAGNOSIS — Z1211 Encounter for screening for malignant neoplasm of colon: Secondary | ICD-10-CM

## 2019-02-21 DIAGNOSIS — Z136 Encounter for screening for cardiovascular disorders: Secondary | ICD-10-CM

## 2019-02-21 DIAGNOSIS — E538 Deficiency of other specified B group vitamins: Secondary | ICD-10-CM

## 2019-02-21 DIAGNOSIS — K219 Gastro-esophageal reflux disease without esophagitis: Secondary | ICD-10-CM | POA: Diagnosis not present

## 2019-02-21 DIAGNOSIS — D6851 Activated protein C resistance: Secondary | ICD-10-CM

## 2019-02-21 DIAGNOSIS — I10 Essential (primary) hypertension: Secondary | ICD-10-CM | POA: Diagnosis not present

## 2019-02-21 DIAGNOSIS — R7309 Other abnormal glucose: Secondary | ICD-10-CM

## 2019-02-21 DIAGNOSIS — M810 Age-related osteoporosis without current pathological fracture: Secondary | ICD-10-CM | POA: Diagnosis not present

## 2019-02-21 DIAGNOSIS — K5190 Ulcerative colitis, unspecified, without complications: Secondary | ICD-10-CM | POA: Diagnosis not present

## 2019-02-21 DIAGNOSIS — Z87891 Personal history of nicotine dependence: Secondary | ICD-10-CM

## 2019-02-21 DIAGNOSIS — Z79899 Other long term (current) drug therapy: Secondary | ICD-10-CM | POA: Diagnosis not present

## 2019-02-21 DIAGNOSIS — R7303 Prediabetes: Secondary | ICD-10-CM

## 2019-02-22 DIAGNOSIS — Z124 Encounter for screening for malignant neoplasm of cervix: Secondary | ICD-10-CM | POA: Diagnosis not present

## 2019-02-22 LAB — URINALYSIS, ROUTINE W REFLEX MICROSCOPIC
Bilirubin Urine: NEGATIVE
Glucose, UA: NEGATIVE
Hgb urine dipstick: NEGATIVE
Ketones, ur: NEGATIVE
Leukocytes,Ua: NEGATIVE
Nitrite: NEGATIVE
Protein, ur: NEGATIVE
Specific Gravity, Urine: 1.011 (ref 1.001–1.03)
pH: 5.5 (ref 5.0–8.0)

## 2019-02-22 LAB — CBC WITH DIFFERENTIAL/PLATELET
Absolute Monocytes: 883 cells/uL (ref 200–950)
Basophils Absolute: 49 cells/uL (ref 0–200)
Basophils Relative: 0.6 %
Eosinophils Absolute: 130 cells/uL (ref 15–500)
Eosinophils Relative: 1.6 %
HCT: 32 % — ABNORMAL LOW (ref 35.0–45.0)
Hemoglobin: 10.6 g/dL — ABNORMAL LOW (ref 11.7–15.5)
Lymphs Abs: 1758 cells/uL (ref 850–3900)
MCH: 31.8 pg (ref 27.0–33.0)
MCHC: 33.1 g/dL (ref 32.0–36.0)
MCV: 96.1 fL (ref 80.0–100.0)
MPV: 9.2 fL (ref 7.5–12.5)
Monocytes Relative: 10.9 %
Neutro Abs: 5281 cells/uL (ref 1500–7800)
Neutrophils Relative %: 65.2 %
Platelets: 270 10*3/uL (ref 140–400)
RBC: 3.33 10*6/uL — ABNORMAL LOW (ref 3.80–5.10)
RDW: 12.8 % (ref 11.0–15.0)
Total Lymphocyte: 21.7 %
WBC: 8.1 10*3/uL (ref 3.8–10.8)

## 2019-02-22 LAB — LIPID PANEL
Cholesterol: 204 mg/dL — ABNORMAL HIGH (ref <200)
HDL: 99 mg/dL (ref >=50)
LDL Cholesterol (Calc): 81 mg/dL (calc)
Non-HDL Cholesterol (Calc): 105 mg/dL (calc) (ref <130)
Total CHOL/HDL Ratio: 2.1 (calc) (ref <5.0)
Triglycerides: 141 mg/dL (ref <150)

## 2019-02-22 LAB — COMPLETE METABOLIC PANEL WITH GFR
AG Ratio: 2.2 (calc) (ref 1.0–2.5)
ALT: 10 U/L (ref 6–29)
AST: 18 U/L (ref 10–35)
Albumin: 4.8 g/dL (ref 3.6–5.1)
Alkaline phosphatase (APISO): 47 U/L (ref 37–153)
BUN: 19 mg/dL (ref 7–25)
CO2: 27 mmol/L (ref 20–32)
Calcium: 10.4 mg/dL (ref 8.6–10.4)
Chloride: 100 mmol/L (ref 98–110)
Creat: 0.81 mg/dL (ref 0.60–0.93)
GFR, Est African American: 85 mL/min/{1.73_m2} (ref >=60)
GFR, Est Non African American: 74 mL/min/{1.73_m2} (ref >=60)
Globulin: 2.2 g/dL (calc) (ref 1.9–3.7)
Glucose, Bld: 96 mg/dL (ref 65–99)
Potassium: 4.7 mmol/L (ref 3.5–5.3)
Sodium: 139 mmol/L (ref 135–146)
Total Bilirubin: 0.5 mg/dL (ref 0.2–1.2)
Total Protein: 7 g/dL (ref 6.1–8.1)

## 2019-02-22 LAB — MICROALBUMIN / CREATININE URINE RATIO
Creatinine, Urine: 49 mg/dL (ref 20–275)
Microalb Creat Ratio: 4 mcg/mg creat (ref <30)
Microalb, Ur: 0.2 mg/dL

## 2019-02-22 LAB — TSH: TSH: 1.68 mIU/L (ref 0.40–4.50)

## 2019-02-22 LAB — HEMOGLOBIN A1C
Hgb A1c MFr Bld: 4.8 % of total Hgb (ref <5.7)
Mean Plasma Glucose: 91 (calc)
eAG (mmol/L): 5 (calc)

## 2019-02-22 LAB — INSULIN, RANDOM: Insulin: 3.6 u[IU]/mL

## 2019-02-22 LAB — VITAMIN D 25 HYDROXY (VIT D DEFICIENCY, FRACTURES): Vit D, 25-Hydroxy: 75 ng/mL (ref 30–100)

## 2019-02-22 LAB — VITAMIN B12: Vitamin B-12: 1447 pg/mL — ABNORMAL HIGH (ref 200–1100)

## 2019-02-22 LAB — MAGNESIUM: Magnesium: 2 mg/dL (ref 1.5–2.5)

## 2019-03-03 ENCOUNTER — Other Ambulatory Visit: Payer: Self-pay | Admitting: Obstetrics and Gynecology

## 2019-03-03 DIAGNOSIS — Z1231 Encounter for screening mammogram for malignant neoplasm of breast: Secondary | ICD-10-CM

## 2019-03-29 ENCOUNTER — Other Ambulatory Visit: Payer: Self-pay | Admitting: *Deleted

## 2019-03-29 DIAGNOSIS — Z1212 Encounter for screening for malignant neoplasm of rectum: Secondary | ICD-10-CM

## 2019-03-29 DIAGNOSIS — Z1211 Encounter for screening for malignant neoplasm of colon: Secondary | ICD-10-CM

## 2019-03-29 LAB — POC HEMOCCULT BLD/STL (HOME/3-CARD/SCREEN)
Card #2 Fecal Occult Blod, POC: NEGATIVE
Card #3 Fecal Occult Blood, POC: NEGATIVE
Fecal Occult Blood, POC: NEGATIVE

## 2019-03-31 ENCOUNTER — Other Ambulatory Visit: Payer: Self-pay | Admitting: Internal Medicine

## 2019-03-31 DIAGNOSIS — K219 Gastro-esophageal reflux disease without esophagitis: Secondary | ICD-10-CM

## 2019-04-05 ENCOUNTER — Other Ambulatory Visit: Payer: Self-pay | Admitting: Adult Health

## 2019-04-28 ENCOUNTER — Other Ambulatory Visit: Payer: Self-pay | Admitting: Internal Medicine

## 2019-04-28 MED ORDER — GABAPENTIN 300 MG PO CAPS: ORAL_CAPSULE | ORAL | 0 refills | Status: DC

## 2019-04-28 MED ORDER — PREDNISONE 20 MG PO TABS: ORAL_TABLET | ORAL | 0 refills | Status: AC

## 2019-05-01 ENCOUNTER — Other Ambulatory Visit: Payer: Self-pay | Admitting: Internal Medicine

## 2019-05-01 MED ORDER — IBANDRONATE SODIUM 150 MG PO TABS: ORAL_TABLET | ORAL | 3 refills | Status: AC

## 2019-05-01 MED ORDER — IBANDRONATE SODIUM 150 MG PO TABS: 150.0000 mg | ORAL_TABLET | ORAL | 3 refills | Status: DC

## 2019-05-03 DIAGNOSIS — Z23 Encounter for immunization: Secondary | ICD-10-CM | POA: Diagnosis not present

## 2019-05-17 DIAGNOSIS — R030 Elevated blood-pressure reading, without diagnosis of hypertension: Secondary | ICD-10-CM | POA: Diagnosis not present

## 2019-05-17 DIAGNOSIS — M4126 Other idiopathic scoliosis, lumbar region: Secondary | ICD-10-CM | POA: Diagnosis not present

## 2019-05-17 DIAGNOSIS — M546 Pain in thoracic spine: Secondary | ICD-10-CM | POA: Diagnosis not present

## 2019-05-17 DIAGNOSIS — M4185 Other forms of scoliosis, thoracolumbar region: Secondary | ICD-10-CM | POA: Diagnosis not present

## 2019-05-17 DIAGNOSIS — M5414 Radiculopathy, thoracic region: Secondary | ICD-10-CM | POA: Diagnosis not present

## 2019-05-23 ENCOUNTER — Other Ambulatory Visit: Payer: Self-pay | Admitting: Internal Medicine

## 2019-05-23 MED ORDER — GABAPENTIN 600 MG PO TABS: ORAL_TABLET | ORAL | 0 refills | Status: AC

## 2019-05-23 MED ORDER — DEXAMETHASONE 4 MG PO TABS: ORAL_TABLET | ORAL | 0 refills | Status: AC

## 2019-05-26 DIAGNOSIS — M4185 Other forms of scoliosis, thoracolumbar region: Secondary | ICD-10-CM | POA: Diagnosis not present

## 2019-05-31 DIAGNOSIS — M47814 Spondylosis without myelopathy or radiculopathy, thoracic region: Secondary | ICD-10-CM | POA: Diagnosis not present

## 2019-05-31 DIAGNOSIS — M4126 Other idiopathic scoliosis, lumbar region: Secondary | ICD-10-CM | POA: Diagnosis not present

## 2019-05-31 DIAGNOSIS — M5134 Other intervertebral disc degeneration, thoracic region: Secondary | ICD-10-CM | POA: Diagnosis not present

## 2019-05-31 DIAGNOSIS — M5124 Other intervertebral disc displacement, thoracic region: Secondary | ICD-10-CM | POA: Diagnosis not present

## 2019-05-31 DIAGNOSIS — M5414 Radiculopathy, thoracic region: Secondary | ICD-10-CM | POA: Diagnosis not present

## 2019-05-31 DIAGNOSIS — M4185 Other forms of scoliosis, thoracolumbar region: Secondary | ICD-10-CM | POA: Diagnosis not present

## 2019-06-08 ENCOUNTER — Telehealth: Payer: Self-pay | Admitting: Internal Medicine

## 2019-06-08 NOTE — Telephone Encounter (Signed)
Paramedic called to notify Dr Unk Pinto patient was found expired in her home, seems to be of natural causes. Suspects patient  Expired 06/06/2019.

## 2019-06-27 DEATH — deceased

## 2019-07-19 ENCOUNTER — Ambulatory Visit: Payer: Medicare Other

## 2019-07-19 ENCOUNTER — Other Ambulatory Visit: Payer: Medicare Other

## 2019-08-29 ENCOUNTER — Ambulatory Visit: Payer: Medicare Other | Admitting: Adult Health

## 2019-08-29 ENCOUNTER — Ambulatory Visit: Payer: Medicare Other | Admitting: Physician Assistant

## 2020-03-04 ENCOUNTER — Encounter: Payer: Medicare Other | Admitting: Internal Medicine

## 2022-07-27 DEATH — deceased
